# Patient Record
Sex: Male | Born: 1980
Health system: Southern US, Community
[De-identification: ages and names within clinical notes are randomized; demographics above are authoritative.]

## PROBLEM LIST (undated history)

## (undated) DIAGNOSIS — I4510 Unspecified right bundle-branch block: Secondary | ICD-10-CM

## (undated) DIAGNOSIS — E1110 Type 2 diabetes mellitus with ketoacidosis without coma: Secondary | ICD-10-CM

## (undated) DIAGNOSIS — I493 Ventricular premature depolarization: Secondary | ICD-10-CM

## (undated) HISTORY — PX: KNEE ARTHROSCOPY: SUR90

## (undated) HISTORY — DX: Ventricular premature depolarization: I49.3

## (undated) HISTORY — DX: Unspecified right bundle-branch block: I45.10

---

## 2008-03-28 ENCOUNTER — Emergency Department (HOSPITAL_COMMUNITY): Admission: EM | Admit: 2008-03-28 | Discharge: 2008-03-28 | Payer: Self-pay | Admitting: Family Medicine

## 2008-08-25 ENCOUNTER — Emergency Department (HOSPITAL_COMMUNITY): Admission: EM | Admit: 2008-08-25 | Discharge: 2008-08-25 | Payer: Self-pay | Admitting: Emergency Medicine

## 2010-12-24 ENCOUNTER — Emergency Department (HOSPITAL_COMMUNITY): Payer: Self-pay

## 2010-12-24 ENCOUNTER — Inpatient Hospital Stay (INDEPENDENT_AMBULATORY_CARE_PROVIDER_SITE_OTHER)
Admission: RE | Admit: 2010-12-24 | Discharge: 2010-12-24 | Disposition: A | Discharge status: Home / Self Care | Payer: Self-pay | Source: Ambulatory Visit | Attending: Emergency Medicine | Admitting: Emergency Medicine

## 2010-12-24 ENCOUNTER — Emergency Department (HOSPITAL_COMMUNITY)
Admission: EM | Admit: 2010-12-24 | Discharge: 2010-12-24 | Disposition: A | Discharge status: Home / Self Care | Payer: Self-pay | Attending: Emergency Medicine | Admitting: Emergency Medicine

## 2010-12-24 DIAGNOSIS — R1013 Epigastric pain: Secondary | ICD-10-CM | POA: Insufficient documentation

## 2010-12-24 DIAGNOSIS — R000 Tachycardia, unspecified: Secondary | ICD-10-CM | POA: Insufficient documentation

## 2010-12-24 DIAGNOSIS — R42 Dizziness and giddiness: Secondary | ICD-10-CM | POA: Insufficient documentation

## 2010-12-24 DIAGNOSIS — I4949 Other premature depolarization: Secondary | ICD-10-CM

## 2010-12-24 DIAGNOSIS — I498 Other specified cardiac arrhythmias: Secondary | ICD-10-CM

## 2010-12-24 DIAGNOSIS — R5381 Other malaise: Secondary | ICD-10-CM | POA: Insufficient documentation

## 2010-12-24 LAB — URINALYSIS, ROUTINE W REFLEX MICROSCOPIC
Ketones, ur: NEGATIVE mg/dL
Nitrite: NEGATIVE
Protein, ur: NEGATIVE mg/dL

## 2010-12-24 LAB — BASIC METABOLIC PANEL
BUN: 7 mg/dL (ref 6–23)
Calcium: 9.4 mg/dL (ref 8.4–10.5)
Creatinine, Ser: 1.13 mg/dL (ref 0.4–1.5)
GFR calc Af Amer: >60 mL/min (ref >60)
GFR calc non Af Amer: >60 mL/min (ref >60)

## 2010-12-24 LAB — CBC
HCT: 39.4 % (ref 39.0–52.0)
Hemoglobin: 13.4 g/dL (ref 13.0–17.0)
RBC: 4.92 MIL/uL (ref 4.22–5.81)

## 2010-12-24 LAB — DIFFERENTIAL
Basophils Absolute: 0 10*3/uL (ref 0.0–0.1)
Basophils Relative: 0 % (ref 0–1)
Lymphocytes Relative: 21 % (ref 12–46)
Monocytes Absolute: 1.2 10*3/uL — ABNORMAL HIGH (ref 0.1–1.0)
Neutro Abs: 6.4 10*3/uL (ref 1.7–7.7)
Neutrophils Relative %: 66 % (ref 43–77)

## 2011-01-06 ENCOUNTER — Encounter: Payer: Self-pay | Admitting: *Deleted

## 2011-01-06 ENCOUNTER — Encounter: Payer: Self-pay | Admitting: Cardiovascular Disease

## 2011-01-07 ENCOUNTER — Encounter: Payer: Self-pay | Admitting: Cardiovascular Disease

## 2011-01-07 ENCOUNTER — Ambulatory Visit (INDEPENDENT_AMBULATORY_CARE_PROVIDER_SITE_OTHER): Payer: Self-pay | Admitting: Cardiovascular Disease

## 2011-01-07 VITALS — BP 132/90 | HR 98 | Resp 14 | Ht 72.0 in | Wt 301.0 lb

## 2011-01-07 DIAGNOSIS — I493 Ventricular premature depolarization: Secondary | ICD-10-CM

## 2011-01-07 DIAGNOSIS — R42 Dizziness and giddiness: Secondary | ICD-10-CM

## 2011-01-07 DIAGNOSIS — E669 Obesity, unspecified: Secondary | ICD-10-CM

## 2011-01-07 DIAGNOSIS — I4949 Other premature depolarization: Secondary | ICD-10-CM

## 2011-01-07 NOTE — Patient Instructions (Signed)
Your physician recommends that you schedule a follow-up appointment in: AFTER TESTING  Your physician has requested that you have a stress echocardiogram. For further information please visit [email or web address] Please follow instruction sheet as given.

## 2011-01-07 NOTE — Assessment & Plan Note (Signed)
Discussed low carb [place] type diet.  Exercise program is stress echo normal.  Can use [employer] if needed for stress echo

## 2011-01-07 NOTE — Progress Notes (Signed)
30 yo referred from [facility] ER for dizzyness and PVC's.  Seen in ER about two weeks ago.  Had URI felt feverish for a few days prior.  Labs ok.  Had one 5 hr energy drink.  Did not note any palpitations.  Not postural and ECG otherwise ok without long QT.  No family history of sudden death, syncope.  ? Grandparent with CAD and CHF.  No previous cardiac disease.  No primary care doctor.  Obese and sedentary.  Discussed eating habits, portion size, not skipping meals and low carb diet.  Feels ok now with no palpitations, SSCP, dizzyness.  No regular meds, drugs.    ROS: Denies fever, malais, weight loss, blurry vision, decreased visual acuity, cough, sputum, SOB, hemoptysis, pleuritic pain, palpitaitons, heartburn, abdominal pain, melena, lower extremity edema, claudication, or rash.  All other systems reviewed and negative   General: Affect appropriate Healthy:  appears stated age HEENT: normal Neck supple with no adenopathy JVP normal no bruits no thyromegaly Lungs clear with no wheezing and good diaphragmatic motion Heart:  S1/S2 no murmur,rub, gallop or click PMI normal Abdomen: benighn, BS positve, no tenderness, no AAA no bruit.  No HSM or HJR Distal pulses intact with no bruits No edema Neuro non-focal Skin warm and dry No muscular weakness  Medications No current outpatient prescriptions on file.    Allergies Review of patient's allergies indicates no known allergies.  Family History: No family history on file.  Social History: History   Social History  . Marital Status: Married    Spouse Name: N/A    Number of Children: N/A  . Years of Education: N/A   Occupational History  . Not on file.   Social History Main Topics  . Smoking status: Never Smoker   . Smokeless tobacco: Not on file  . Alcohol Use: Not on file  . Drug Use: Not on file  . Sexually Active: Not on file   Other Topics Concern  . Not on file   Social History Narrative  . No narrative on  file    Electrocardiogram: NSR normal QT and intervals PVC;s  Assessment and Plan

## 2011-01-07 NOTE — Assessment & Plan Note (Signed)
Likely related to energy drink and URI.  Resolved

## 2011-01-07 NOTE — Assessment & Plan Note (Signed)
Need to R/O DCM and assess rhythm with exercise.  Stress echo

## 2011-01-13 ENCOUNTER — Ambulatory Visit (HOSPITAL_BASED_OUTPATIENT_CLINIC_OR_DEPARTMENT_OTHER): Payer: Self-pay | Admitting: Radiology

## 2011-01-13 ENCOUNTER — Ambulatory Visit (HOSPITAL_COMMUNITY): Payer: Self-pay | Attending: Cardiovascular Disease | Admitting: Radiology

## 2011-01-13 DIAGNOSIS — I4949 Other premature depolarization: Secondary | ICD-10-CM | POA: Insufficient documentation

## 2011-01-13 DIAGNOSIS — I493 Ventricular premature depolarization: Secondary | ICD-10-CM

## 2011-01-13 DIAGNOSIS — R0989 Other specified symptoms and signs involving the circulatory and respiratory systems: Secondary | ICD-10-CM

## 2011-01-13 MED ORDER — PERFLUTREN PROTEIN A MICROSPH IV SUSP
3.0000 mL | Freq: Once | INTRAVENOUS | Status: AC
  Administered 2011-01-13: 3 mL via INTRAVENOUS

## 2011-02-12 ENCOUNTER — Encounter: Payer: Self-pay | Admitting: Cardiovascular Disease

## 2011-02-12 ENCOUNTER — Ambulatory Visit (INDEPENDENT_AMBULATORY_CARE_PROVIDER_SITE_OTHER): Payer: Self-pay | Admitting: Cardiovascular Disease

## 2011-02-12 VITALS — BP 130/87 | HR 95 | Resp 14 | Ht 72.0 in | Wt 303.0 lb

## 2011-02-12 DIAGNOSIS — I4949 Other premature depolarization: Secondary | ICD-10-CM

## 2011-02-12 DIAGNOSIS — R42 Dizziness and giddiness: Secondary | ICD-10-CM

## 2011-02-12 DIAGNOSIS — E669 Obesity, unspecified: Secondary | ICD-10-CM

## 2011-02-12 DIAGNOSIS — I493 Ventricular premature depolarization: Secondary | ICD-10-CM

## 2011-02-12 NOTE — Assessment & Plan Note (Signed)
Reiterated exercise program and low carb diet

## 2011-02-12 NOTE — Patient Instructions (Signed)
Your physician wants you to follow-up in: 6 MONTHS  You will receive a reminder letter in the mail two months in advance. If you don't receive a letter, please call our office to schedule the follow-up appointment.   Your physician has recommended that you wear a holter monitor. Holter monitors are medical devices that record the heart's electrical activity. Doctors most often use these monitors to diagnose arrhythmias. Arrhythmias are problems with the speed or rhythm of the heartbeat. The monitor is a small, portable device. You can wear one while you do your normal daily activities. This is usually used to diagnose what is causing palpitations/syncope (passing out).24 HOUR

## 2011-02-12 NOTE — Progress Notes (Signed)
30 yo referred from [facility] ER for dizzyness and PVC's. Seen in ER about two weeks ago. Had URI felt feverish for a few days prior. Labs ok. Had one 5 hr energy drink. Did not note any palpitations. Not postural and ECG otherwise ok without long QT. No family history of sudden death, syncope. ? Grandparent with CAD and CHF. No previous cardiac disease. No primary care doctor. Obese and sedentary. Discussed eating habits, portion size, not skipping meals and low carb diet. Feels ok now with no palpitations, SSCP, dizzyness. No regular meds, drugs.   Since last month has not lost weight but trying to have atleast cheese in am.  No SSCP, or palpitations.  Stress echo was normal.  Still having PVC;s in office today  ROS: Denies fever, malais, weight loss, blurry vision, decreased visual acuity, cough, sputum, SOB, hemoptysis, pleuritic pain, palpitaitons, heartburn, abdominal pain, melena, lower extremity edema, claudication, or rash.  All other systems reviewed and negative  General: Affect appropriate Healthy:  appears stated age HEENT: normal Neck supple with no adenopathy JVP normal no bruits no thyromegaly Lungs clear with no wheezing and good diaphragmatic motion Heart:  S1/S2 no murmur,rub, gallop or click PMI normal Abdomen: benighn, BS positve, no tenderness, no AAA no bruit.  No HSM or HJR Distal pulses intact with no bruits No edema Neuro non-focal Skin warm and dry No muscular weakness   No current outpatient prescriptions on file.    Allergies  Review of patient's allergies indicates no known allergies.  Electrocardiogram:  Assessment and Plan

## 2011-02-12 NOTE — Assessment & Plan Note (Signed)
Resolved.  Likely related to energy drink and dehydration

## 2011-02-12 NOTE — Assessment & Plan Note (Signed)
Encouraging that stress echo was normal.  24 hr holter to quantify.

## 2011-02-19 ENCOUNTER — Encounter (INDEPENDENT_AMBULATORY_CARE_PROVIDER_SITE_OTHER): Payer: Self-pay

## 2011-02-19 DIAGNOSIS — I493 Ventricular premature depolarization: Secondary | ICD-10-CM

## 2011-02-19 DIAGNOSIS — I4949 Other premature depolarization: Secondary | ICD-10-CM

## 2011-03-06 ENCOUNTER — Telehealth: Payer: Self-pay | Admitting: *Deleted

## 2011-03-06 NOTE — Telephone Encounter (Signed)
LEFT MESSAGE WITH WIFE FOR PT TO CALL BACK  RE HOLTER RESULTS PER DR NISHAN NEEDS  EP REFERRAL  MONITOR SHOWS  SR PVC'S BIGEMINY  AT  8 % OF TIME .Zack Seal

## 2011-03-10 NOTE — Telephone Encounter (Signed)
LMTCB ONCE AGAIN./CY

## 2011-03-16 NOTE — Telephone Encounter (Signed)
ATTEMPTED TO REACH PT AGAIN UNABLE TO LEAVE MESSAGE MESSAGE MAIL BOX IS FULL ./CY

## 2011-03-18 NOTE — Telephone Encounter (Signed)
LM ONCE AGAIN  FOR PT TO CALL RE MONITOR RESULTS./CY

## 2011-03-20 ENCOUNTER — Telehealth: Payer: Self-pay | Admitting: Internal Medicine

## 2011-03-20 NOTE — Telephone Encounter (Signed)
Pt declined earlier appt, didn't want to come sooner, wanted to ask what the appt was about and why he needed to be seen by a specialist

## 2011-04-20 ENCOUNTER — Ambulatory Visit (INDEPENDENT_AMBULATORY_CARE_PROVIDER_SITE_OTHER): Payer: Self-pay | Admitting: Internal Medicine

## 2011-04-20 ENCOUNTER — Encounter: Payer: Self-pay | Admitting: Internal Medicine

## 2011-04-20 DIAGNOSIS — E669 Obesity, unspecified: Secondary | ICD-10-CM

## 2011-04-20 DIAGNOSIS — I4949 Other premature depolarization: Secondary | ICD-10-CM

## 2011-04-20 DIAGNOSIS — R42 Dizziness and giddiness: Secondary | ICD-10-CM

## 2011-04-20 DIAGNOSIS — I493 Ventricular premature depolarization: Secondary | ICD-10-CM

## 2011-04-20 NOTE — Assessment & Plan Note (Signed)
Weight loss advised 

## 2011-04-20 NOTE — Assessment & Plan Note (Signed)
The patient is a pleasant 30 yo AAM with no family history of arrhythmias or sudden death who presents today for EP consultation regarding very frequent PVCs.  At this time, he is asymptomatic with his PVCs.  He has had no documented VT and denies history of presyncope or syncope.  His recent stress echo was without evidence of ischemia.  He does have a baseline RBBB. 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  At this point, I think that we should exclude obvious right ventricular pathology.  I will ask Dr Eden Emms to review the recent echo to evaluate the RV.  If the RV is adequately visualized and normal, then no further testing is advised.  If his RV cannot be adequately visualized or is felt to be abnormal, then I would recommend cardiac MRI. I have offered beta blocker therapy for PVCs suppression at this time.  As the patient is asymptomatic, he declines beta blockers, stating that he would like to avoid medications.  If he develops symptoms with his PVCs, then we could consider beta blocker therapy vs calcium channel blockers.  I would be happy to see him again at that time.  In the interim, he will continue to follow with Dr Eden Emms.

## 2011-04-20 NOTE — Progress Notes (Signed)
Roy Jennings is a pleasant 30 y.o. AAM patient with a h/o recently discovered PVCs and RBBB who presents today for EP consultation.  He reports being in good health until 6/12 when he began having episodes of dizziness.  He reports having "headaches" and sweats.  He reports having associated dizziness upon standing, typically lasting several minutes.  He reports feeling "woozy" when walking.  He had symptoms of URI and felt that he may have had "flu". He presented to [facility] ER and was found to have PVCs.  He was felt to have dizziness due to "energy drinks".  He stopped drinking energy drinks and feels that this helped resolved his symptoms. Presently, he is doing well.  He feels at his baseline and remains very active.  He walks 2 miles every other day without difficulty. He denies any prior episodes of syncope or presyncope.  He has no family history of seizures, syncope, sudden death, or arrhythmias.  Today, he denies symptoms of palpitations, chest pain, shortness of breath, orthopnea, PND, lower extremity edema, dizziness, presyncope, syncope, or neurologic sequela. The patient is tolerating medications without difficulties and is otherwise without complaint today.   Past Medical History  Diagnosis Date  . Pain, dental   . PVC (premature ventricular contraction)     LBB inferior axis QRS  . Dizziness   . Right bundle branch block   . DJD (degenerative joint disease)    Past Surgical History  Procedure Date  . Knee arthroscopy     bilateral    Medications:  none  No Known Allergies  History   Social History  . Marital Status: Married    Spouse Name: N/A    Number of Children: N/A  . Years of Education: N/A   Occupational History  . Not on file.   Social History Main Topics  . Smoking status: Never Smoker   . Smokeless tobacco: Not on file  . Alcohol Use: No  . Drug Use: No  . Sexually Active: Not on file   Other Topics Concern  . Not on file   Social History  Narrative   Pt lives in [place] with wife.  1 son is healthy age 13.    Family History  Problem Relation Age of Onset  . Diabetes    no FH of arrhythmias or sudden death  ROS- All systems are reviewed and negative except as per the HPI above  Physical Exam: Filed Vitals:   04/20/11 1205  BP: 121/76  Pulse: 76  Height: 5\' 11"  (1.803 m)  Weight: 297 lb 12.8 oz (135.081 kg)    GEN- The patient is obese but well appearing, alert and oriented x 3 today.   Head- normocephalic, atraumatic Eyes-  Sclera clear, conjunctiva pink Ears- hearing intact Oropharynx- clear Neck- supple, no JVP Lymph- no cervical lymphadenopathy Lungs- Clear to ausculation bilaterally, normal work of breathing Heart- Regular rate and rhythm with occasional ectopy, no murmurs, rubs or gallops, PMI not laterally displaced GI- soft, NT, ND, + BS Extremities- no clubbing, cyanosis, or edema MS- no significant deformity or atrophy Skin- no rash or lesion Psych- euthymic mood, full affect Neuro- strength and sensation are intact  EKG today reveals sinus rhythm with RBBB, PR 160, QRS 148, Qtc 459, PVCs are of a LBB inferior axis morphology and are not closely coupled to preceeding T waves Stress echo also reviewed  Assessment and Plan:

## 2011-04-20 NOTE — Assessment & Plan Note (Signed)
resolved 

## 2011-04-28 ENCOUNTER — Telehealth: Payer: Self-pay | Admitting: *Deleted

## 2011-04-28 DIAGNOSIS — I493 Ventricular premature depolarization: Secondary | ICD-10-CM

## 2011-04-28 NOTE — Telephone Encounter (Signed)
Message copied by Freddi Starr on Tue Apr 28, 2011  3:22 PM ------      Message from: Wendall Stade      Created: Sun Apr 26, 2011 12:23 AM      Regarding: FW: patient       Schedule MRI to R/O RV dysplasia      ----- Message -----         From: Gardiner Rhyme, MD         Sent: 04/20/2011   8:53 PM           To: Wendall Stade, MD      Subject: patient                                                  Please look at Mr Lawyers prior echo to evaluate his RV.  If adequately visualized and normal, then I recommend no further testing.  If you don't feel that his RV is adequately seen or is abnormal, then I would recommend cardiac MRI.

## 2011-04-28 NOTE — Telephone Encounter (Signed)
Spoke with pt, he is aware he will need a cardiac mri and someone will call to schedule Deliah Goody

## 2011-04-29 ENCOUNTER — Telehealth: Payer: Self-pay | Admitting: Cardiovascular Disease

## 2011-04-29 NOTE — Telephone Encounter (Signed)
Spoke with pt wife, she questions how important and how soon he would need the MRI testing. They have both lost their jobs and want to put off the testing if not urgent at this time. They also want to know what the next step would be if something was abnormal with the RV. Will forward for dr allred review Deliah Goody

## 2011-04-29 NOTE — Telephone Encounter (Signed)
When I saw him, he reported that his symptoms were resolved.  He also denies h/o syncope or family history of arrhythmia.  These would all argue that we could avoid the MRI possibly. If he wishes to hold off on the MRI, I think that this is reasonable.  It may give [place] additional information about his heart however. If he does not have insurance, I can understand his concerns. He can contact us in the future when he wishes to proceed.  A cheaper alternative could be 2D echo to get a better look at the RV, though I will defer this decision to Dr Eden Emms.

## 2011-04-29 NOTE — Telephone Encounter (Signed)
Pt's wife is calling about mri please call

## 2011-04-30 NOTE — Telephone Encounter (Signed)
Per dr Eden Emms and dr allred pt does not need any testing at this time . Spoke with pt wife, she is aware to give [place] a call if the pt has any problems otherwise we will decide about testing when pt returns for follow up Roy Jennings

## 2011-05-13 ENCOUNTER — Other Ambulatory Visit (HOSPITAL_COMMUNITY): Payer: Self-pay

## 2011-12-30 ENCOUNTER — Inpatient Hospital Stay (HOSPITAL_COMMUNITY)
Admission: EM | Admit: 2011-12-30 | Discharge: 2011-12-31 | DRG: 638 | Disposition: A | Discharge status: Home / Self Care | Payer: MEDICAID | Attending: Internal Medicine | Admitting: Internal Medicine

## 2011-12-30 ENCOUNTER — Emergency Department (INDEPENDENT_AMBULATORY_CARE_PROVIDER_SITE_OTHER)
Admission: EM | Admit: 2011-12-30 | Discharge: 2011-12-30 | Disposition: A | Discharge status: Nursing Home (Includes ICF) | Payer: Self-pay | Source: Home / Self Care | Attending: Family Medicine | Admitting: Family Medicine

## 2011-12-30 ENCOUNTER — Encounter (HOSPITAL_COMMUNITY): Payer: Self-pay | Admitting: *Deleted

## 2011-12-30 ENCOUNTER — Other Ambulatory Visit: Payer: Self-pay

## 2011-12-30 ENCOUNTER — Encounter (HOSPITAL_COMMUNITY): Payer: Self-pay | Admitting: Emergency Medicine

## 2011-12-30 DIAGNOSIS — E1010 Type 1 diabetes mellitus with ketoacidosis without coma: Principal | ICD-10-CM | POA: Diagnosis present

## 2011-12-30 DIAGNOSIS — R0902 Hypoxemia: Secondary | ICD-10-CM | POA: Diagnosis present

## 2011-12-30 DIAGNOSIS — I498 Other specified cardiac arrhythmias: Secondary | ICD-10-CM

## 2011-12-30 DIAGNOSIS — R739 Hyperglycemia, unspecified: Secondary | ICD-10-CM | POA: Diagnosis present

## 2011-12-30 DIAGNOSIS — E119 Type 2 diabetes mellitus without complications: Secondary | ICD-10-CM

## 2011-12-30 DIAGNOSIS — R7309 Other abnormal glucose: Secondary | ICD-10-CM

## 2011-12-30 DIAGNOSIS — B370 Candidal stomatitis: Secondary | ICD-10-CM | POA: Diagnosis present

## 2011-12-30 DIAGNOSIS — E1110 Type 2 diabetes mellitus with ketoacidosis without coma: Secondary | ICD-10-CM

## 2011-12-30 DIAGNOSIS — I4510 Unspecified right bundle-branch block: Secondary | ICD-10-CM | POA: Diagnosis present

## 2011-12-30 DIAGNOSIS — R000 Tachycardia, unspecified: Secondary | ICD-10-CM

## 2011-12-30 DIAGNOSIS — E860 Dehydration: Secondary | ICD-10-CM

## 2011-12-30 DIAGNOSIS — D72829 Elevated white blood cell count, unspecified: Secondary | ICD-10-CM | POA: Diagnosis present

## 2011-12-30 HISTORY — DX: Type 2 diabetes mellitus with ketoacidosis without coma: E11.10

## 2011-12-30 LAB — BASIC METABOLIC PANEL
Calcium: 9.3 mg/dL (ref 8.4–10.5)
Calcium: 9.1 mg/dL (ref 8.4–10.5)
Creatinine, Ser: 1.06 mg/dL (ref 0.50–1.35)
GFR calc Af Amer: >90 mL/min (ref >90)
GFR calc Af Amer: >90 mL/min (ref >90)
GFR calc non Af Amer: >90 mL/min (ref >90)
GFR calc non Af Amer: >90 mL/min (ref >90)
Potassium: 3.9 mEq/L (ref 3.5–5.1)
Sodium: 138 mEq/L (ref 135–145)
Sodium: 136 mEq/L (ref 135–145)

## 2011-12-30 LAB — CBC
Hemoglobin: 15.2 g/dL (ref 13.0–17.0)
Hemoglobin: 14.4 g/dL (ref 13.0–17.0)
MCH: 27 pg (ref 26.0–34.0)
MCHC: 33.9 g/dL (ref 30.0–36.0)
MCHC: 34.8 g/dL (ref 30.0–36.0)
MCV: 79.7 fL (ref 78.0–100.0)
RBC: 5.62 MIL/uL (ref 4.22–5.81)
RBC: 5.22 MIL/uL (ref 4.22–5.81)
WBC: 13.1 10*3/uL — ABNORMAL HIGH (ref 4.0–10.5)

## 2011-12-30 LAB — URINALYSIS, ROUTINE W REFLEX MICROSCOPIC
Ketones, ur: 40 mg/dL — AB
Leukocytes, UA: NEGATIVE
Nitrite: NEGATIVE
Specific Gravity, Urine: 1.044 — ABNORMAL HIGH (ref 1.005–1.030)
pH: 5 (ref 5.0–8.0)

## 2011-12-30 LAB — RAPID URINE DRUG SCREEN, HOSP PERFORMED
Barbiturates: NOT DETECTED
Tetrahydrocannabinol: NOT DETECTED

## 2011-12-30 LAB — COMPREHENSIVE METABOLIC PANEL
BUN: 15 mg/dL (ref 6–23)
Calcium: 10.2 mg/dL (ref 8.4–10.5)
Creatinine, Ser: 1.2 mg/dL (ref 0.50–1.35)
GFR calc Af Amer: >90 mL/min (ref >90)
Glucose, Bld: 448 mg/dL — ABNORMAL HIGH (ref 70–99)
Total Protein: 8.1 g/dL (ref 6.0–8.3)

## 2011-12-30 LAB — POCT I-STAT, CHEM 8
Calcium, Ion: 1.27 mmol/L (ref 1.12–1.32)
Chloride: 99 mEq/L (ref 96–112)
HCT: 55 % — ABNORMAL HIGH (ref 39.0–52.0)
Hemoglobin: 18.7 g/dL — ABNORMAL HIGH (ref 13.0–17.0)
Potassium: 4.5 mEq/L (ref 3.5–5.1)

## 2011-12-30 LAB — CARDIAC PANEL(CRET KIN+CKTOT+MB+TROPI)
CK, MB: 2.9 ng/mL (ref 0.3–4.0)
Total CK: 307 U/L — ABNORMAL HIGH (ref 7–232)
Troponin I: <0.3 ng/mL (ref <0.30)

## 2011-12-30 LAB — POCT I-STAT 3, ART BLOOD GAS (G3+)
Bicarbonate: 24.2 mEq/L — ABNORMAL HIGH (ref 20.0–24.0)
Patient temperature: 98.6
TCO2: 25 mmol/L (ref 0–100)
pH, Arterial: 7.377 (ref 7.350–7.450)

## 2011-12-30 LAB — GLUCOSE, CAPILLARY: Glucose-Capillary: 332 mg/dL — ABNORMAL HIGH (ref 70–99)

## 2011-12-30 LAB — URINE MICROSCOPIC-ADD ON

## 2011-12-30 LAB — DIFFERENTIAL
Eosinophils Absolute: 0.1 10*3/uL (ref 0.0–0.7)
Eosinophils Relative: 1 % (ref 0–5)
Lymphs Abs: 2.4 10*3/uL (ref 0.7–4.0)
Monocytes Relative: 6 % (ref 3–12)

## 2011-12-30 MED ORDER — SODIUM CHLORIDE 0.9 % IV SOLN: INTRAVENOUS | Status: AC

## 2011-12-30 MED ORDER — ENOXAPARIN SODIUM 40 MG/0.4ML ~~LOC~~ SOLN
40.0000 mg | SUBCUTANEOUS | Status: DC
  Administered 2011-12-30: 40 mg via SUBCUTANEOUS
  Filled 2011-12-30 (×2): qty 0.4

## 2011-12-30 MED ORDER — SODIUM CHLORIDE 0.9 % IV SOLN: INTRAVENOUS | Status: DC

## 2011-12-30 MED ORDER — DEXTROSE-NACL 5-0.45 % IV SOLN
INTRAVENOUS | Status: DC
  Administered 2011-12-31: 08:00:00 via INTRAVENOUS

## 2011-12-30 MED ORDER — INSULIN REGULAR HUMAN 100 UNIT/ML IJ SOLN
INTRAMUSCULAR | Status: DC
  Administered 2011-12-31: 05:00:00 via INTRAVENOUS
  Filled 2011-12-30: qty 1

## 2011-12-30 MED ORDER — SODIUM CHLORIDE 0.9 % IV SOLN
1000.0000 mL | INTRAVENOUS | Status: DC
  Administered 2011-12-30 – 2011-12-31 (×2): 1000 mL via INTRAVENOUS

## 2011-12-30 MED ORDER — SODIUM CHLORIDE 0.9 % IV SOLN
INTRAVENOUS | Status: DC
  Administered 2011-12-30: 2.7 [IU]/h via INTRAVENOUS
  Filled 2011-12-30: qty 1

## 2011-12-30 MED ORDER — POTASSIUM CHLORIDE 10 MEQ/100ML IV SOLN
10.0000 meq | INTRAVENOUS | Status: AC
  Administered 2011-12-30 (×2): 10 meq via INTRAVENOUS
  Filled 2011-12-30 (×3): qty 100

## 2011-12-30 MED ORDER — SODIUM CHLORIDE 0.9 % IJ SOLN
INTRAMUSCULAR | Status: AC
  Filled 2011-12-30: qty 3

## 2011-12-30 MED ORDER — NYSTATIN 100000 UNIT/ML MT SUSP
5.0000 mL | Freq: Four times a day (QID) | OROMUCOSAL | Status: DC
  Administered 2011-12-30 – 2011-12-31 (×3): 500000 [IU] via ORAL
  Filled 2011-12-30 (×5): qty 5

## 2011-12-30 MED ORDER — SODIUM CHLORIDE 0.9 % IV BOLUS (SEPSIS): 500.0000 mL | Freq: Once | INTRAVENOUS | Status: DC

## 2011-12-30 MED ORDER — ASPIRIN EC 81 MG PO TBEC
81.0000 mg | DELAYED_RELEASE_TABLET | Freq: Every day | ORAL | Status: DC
  Filled 2011-12-30: qty 1

## 2011-12-30 MED ORDER — DEXTROSE 50 % IV SOLN: 25.0000 mL | INTRAVENOUS | Status: DC | PRN

## 2011-12-30 MED ORDER — SODIUM CHLORIDE 0.9 % IV SOLN
1000.0000 mL | Freq: Once | INTRAVENOUS | Status: AC
  Administered 2011-12-30: 1000 mL via INTRAVENOUS

## 2011-12-30 MED ORDER — SODIUM CHLORIDE 0.9 % IV SOLN
INTRAVENOUS | Status: AC
  Administered 2011-12-30: 19:00:00 via INTRAVENOUS

## 2011-12-30 NOTE — H&P (Signed)
Date: 12/30/2011               Patient Name:  Roy Jennings MRN: [id number]  DOB: 08-30-1980 Age / Sex: 31 y.o., male   PCP: None              Medical Service: Internal Medicine Teaching Service              Attending Physician: Dr. Josem Kaufmann    First Contact: Dr. Candy Sledge Pager: [phone]  Second Contact: Dr. Saralyn Pilar Pager: [phone]            After Hours (After 5p/  First Contact Pager: [phone]  weekends / holidays): Second Contact Pager: [phone]     Chief Complaint: Drinking a lot and peeing lot  History of Present Illness: Patient is a 31 y.o. male with a PMHx of newly diagnosed right bundle branch block, who presents to [facility] for evaluation of a 3-4 history of increased frequency urination, and increased thirst. On the day of admission, had increased sensation of positional lightheadedness and a queasy feeling in his stomach. He states he has had a lot of cramping of his legs. Has had reduced appetite over this time frame as well. Denies chest pain and shortness of breath.  Had some sore throat, and has essentially only been eating liquid foods and Jell-O. He tried taking some pepto, which was minimally helpful. Drinking milk also makes his throat feel better. He anything acidic makes the pain worse. He has not noticed any white patches in his mouth. Has had reduced appetite over this time frame as well. Denies chest pain.   Review of Systems: Constitutional:  denies fever, chills, diaphoresis Admits to appetite change and fatigue.  HEENT: denies photophobia, eye pain, redness, hearing loss, ear pain, congestion, sore throat, rhinorrhea, sneezing, neck pain, neck stiffness and tinnitus.  Respiratory: denies SOB, DOE, cough, chest tightness, and wheezing.  Cardiovascular: denies chest pain, palpitations and leg swelling.  Gastrointestinal: denies nausea, vomiting, abdominal pain, diarrhea, constipation, blood in stool.  Genitourinary: denies dysuria, urgency, frequency,  hematuria, flank pain and difficulty urinating.  Musculoskeletal: admits to lower extremity cramping. Denies myalgias, back pain, joint swelling, arthralgias and gait problem.   Skin: denies pallor, rash and wound.  Neurological: denies dizziness, seizures, syncope, weakness, numbness and headaches.  Admits to light-headedness  Hematological: denies adenopathy, easy bruising, personal or family bleeding history.  Psychiatric/ Behavioral: denies suicidal ideation, mood changes, confusion, nervousness, sleep disturbance and agitation.    Current Outpatient Medications:  (Not in a hospital admission) Currently not taking any outpatient medications. He is thinking of starting aspirin. Allergies: No Known Allergies   Past Medical History: Past Medical History  Diagnosis Date  . 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 Dizziness   . Right bundle branch block     Past Surgical History: Past Surgical History  Procedure Date  . Knee arthroscopy     bilateral    Family History: Family History  Problem Relation Age of Onset  . Diabetes Father   . Diabetes Maternal Grandmother     Social History: History   Social History  . Marital Status: Married    Spouse Name: N/A    Number of Children: 1  . Years of Education: BS   Occupational History  .      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 Smoking status: Never Smoker   .  Smokeless tobacco: Never Used  . Alcohol Use: No  . Drug Use: No  . Sexually Active: Not on file   Other Topics Concern  . Not on file   Social History Narrative   Pt lives in [place] with wife.  1 son is healthy age 47.    Vital Signs: Blood pressure 132/75, pulse 119, temperature 97.8 F (36.6 C), temperature source Oral, resp. rate 16, SpO2 96.00%.  Physical Exam: General: Vital signs reviewed and noted. Well-developed, well-nourished, in no acute  distress; alert, appropriate and cooperative throughout examination.  Head: Normocephalic, atraumatic.  Eyes: PERRL, EOMI, No signs of anemia or jaundince.  Nose: Mucous membranes moist, not inflammed, nonerythematous.  Throat: Oropharynx nonerythematous, no exudate appreciated.   Neck: No deformities, masses, or tenderness noted.Supple, No carotid Bruits, no JVD.  Lungs:  Normal respiratory effort. Clear to auscultation BL without crackles or wheezes.  Heart: RRR. S1 and S2 normal without gallop, murmur, or rubs.  Abdomen:  BS normoactive. Soft, Nondistended, non-tender.  No masses or organomegaly.  Extremities: No pretibial edema.  Neurologic: A&O X3, CN II - XII are grossly intact. Motor strength is 5/5 in the all 4 extremities, Sensations intact to light touch, Cerebellar signs negative.  Skin: No visible rashes, scars.   Lab results: Basic Metabolic Panel:  Basename 12/30/11 1542 12/30/11 1336  NA 135 131*  K 4.8 4.5  CL 91* 99  CO2 26 --  GLUCOSE 448* >700*  BUN 15 18  CREATININE 1.20 1.30  CALCIUM 10.2 --  MG -- --  PHOS -- --   Liver Function Tests:  Basename 12/30/11 1542  AST 24  ALT 55*  ALKPHOS 123*  BILITOT 0.4  PROT 8.1  ALBUMIN 3.9   ABG    Component Value Date/Time   PHART 7.377 12/30/2011 1554   PCO2ART 41.3 12/30/2011 1554   PO2ART 75.0* 12/30/2011 1554   HCO3 24.2* 12/30/2011 1554   TCO2 25 12/30/2011 1554   ACIDBASEDEF 1.0 12/30/2011 1554   O2SAT 95.0 12/30/2011 1554    CBC:  Basename 12/30/11 1542 12/30/11 1336  WBC 13.8* --  NEUTROABS 10.4* --  HGB 15.2 18.7*  HCT 44.8 55.0*  MCV 79.7 --  PLT 349 --    Basename 12/30/11 1747 12/30/11 1434  GLUCAP 332* 566*   Urine Drug Screen: Drugs of Abuse     Component Value Date/Time   LABOPIA NONE DETECTED 12/30/2011 1519   COCAINSCRNUR NONE DETECTED 12/30/2011 1519   LABBENZ NONE DETECTED 12/30/2011 1519   AMPHETMU NONE DETECTED 12/30/2011 1519   THCU NONE DETECTED 12/30/2011 1519   LABBARB NONE  DETECTED 12/30/2011 1519    Urinalysis:  Ref. Range 12/30/2011 15:19  Color, Urine Latest Range: YELLOW  YELLOW  APPearance Latest Range: CLEAR  CLEAR  Specific Gravity, Urine Latest Range: 1.005-1.030  1.044 (H)  pH Latest Range: 5.0-8.0  5.0  Glucose, UA Latest Range: NEGATIVE mg/dL >4098 (A)  Bilirubin Urine Latest Range: NEGATIVE  NEGATIVE  Ketones, ur Latest Range: NEGATIVE mg/dL 40 (A)  Protein Latest Range: NEGATIVE mg/dL 119 (A)  Urobilinogen, UA Latest Range: 0.0-1.0 mg/dL 0.2  Nitrite Latest Range: NEGATIVE  NEGATIVE  Leukocytes, UA Latest Range: NEGATIVE  NEGATIVE  Hgb urine dipstick Latest Range: NEGATIVE  TRACE (A)  WBC, UA Latest Range: <3 WBC/hpf 0-2  RBC / HPF Latest Range: <3 RBC/hpf 0-2  Squamous Epithelial / LPF Latest Range: RARE  RARE  Casts Latest Range: NEGATIVE  HYALINE CASTS (A)    Other  results:  EKG (12/30/2011) - sinus tachycardia with right bundle branch block. T wave inversions in lead 3 stable from prior study performed 12/24/2011.  2-D echo Study Date: 01/13/2011 History: PMH: Premature ventricular contractions. Risk factors: Morbidly obese.  Study Conclusions Impressions: Resting images showed normal EF. Baseline ECG with RBBB and frequent PVC;s. With stress ectopy actually diminished. There was no ischemia with stress. PVC;s returned in recovery. Stress images with optison for better clarity of endocardium. Hyperdynamic LV function EF 75% with no new RWMA;s. Normal stress echo Impressions:  - Resting images showed normal EF. Baseline ECG with RBBB and frequent PVC;s. With stress ectopy actually diminished. There was no ischemia with stress. PVC;s returned in recovery. Stress images with optison for better clarity of endocardium. Hyperdynamic LV function EF 75% with no new RWMA;s.    Assessment & Plan:  Pt is a 31 y.o. yo male with a PMHx of right bundle bite branch block who was admitted on 12/30/2011 with symptoms of polyuria, polydipsia and  dysphasia, which was determined to be secondary to diabetes mellitus and thrush Interventions at this time will be focused on treating the patient's current diabetic ketoacidosis and thrush.  1) Diabetic Ketoacidosis - The patient does not have known history of diabetes mellitus, therefore, this acute presentation likely does represent new diabetes diagnosis. Patient with typical expected symptoms including mild in the entire abdomen abdominal pain, sore throat and polyuria and polydipsia. Urinalysis show > 1000 glucose, (+) ketones. This acute occurrence does not seem to have a precipitating factor, though in a patient with right bundle branch block does need to be ruled out for cardiac ischemia. Patient otherwise denies drug abuse such as cocaine or excessive alcohol abuse.  - DKA protocol  - Aggressive IVF liter bolus followed by 200 cc normal saline per hour - Diabetes education, as patient is being newly diagnosed.  - Check HgA1c  - Check TSH  - CM to assess for financial programs to aid in assistance with insulin.   2) Metabolic derangements - ABG was performed today, results showing isolated hypoxia with PO2 of 75 with AG of 18.  No fevers, change in mental status to suggest methanol or ethylene glycol toxicity. No recent new medications. No renal failure and mental status changes to account for uremia. - Will volume replete and treat DKA  3) Hypoxemia - patient has a PO2 of 75 with an AA gradient of 22, while the expected AA gradient for his age is 27. Unclear if possibly venous sample was obtained, but other values are normal for ABG. Otherwise, if correct ABG sample, the differential diagnosis includes shunt (PFO, ASD, PE, pulmonary AVMs), most likely cause would be possible shunt given cardiac disease and questionable right-sided function.  DKA does place the patient more risk for DVT and revised Geneva score is 5 (for heart rate of 120), however he is very volume depleted which might explain  tachycardia.  Prior stress echo did not mention septal defect, but a bubble study would be necessary to evaluate this. Alveolar Hypoventilation (ex: interstitial lung/environmental lung disease) and V/Q mismatch V/Q Mismatch (pneumonia, pulmonary edema, ARDS) are less likely as the patient denies respiratory symptoms, cough, or fever and chest x ray 1 year ago was normal (useful for chronic etiologies). We'll obtain chest x-ray and repeat ABG to ensure our baseline is correct. Then if hypoxemia persists we will obtain D-Dimer and potentially V/Q scan. If these are negative shunt would be a more likely cause. -- 2  view chest x-ray -- D-dimer -- Possible VQ scan versus CTA  4) Right bundle branch block - appears stable from September 2012. Patient received normal stress echo in June of 2012 for PVCs per the clinic notes from Dr. Eden Emms. A cardiac MRI was suggested, in consultation with his cardiologist this test was deferred. Will continue to monitor, but suspect this is related to problem #3.  5) Thush - likely secondary to uncontrolled diabetes. will treat with nystatin 500,000 units 4 times a day for 48 hours after his symptoms resolve  6) Leukocytosis - likely secondary to stress hyperglycemia in setting of acute DKA. UA negative for evidence of UTI.  - Will monitor, and continue to monitor for signs/ symptoms of infection.  - Check CXR.  7) morbid obesity - counsel patient regarding diet modification.   DVT PPX - low molecular weight heparin    Latania Bascomb  12/30/2011, 6:41 PM

## 2011-12-30 NOTE — ED Notes (Addendum)
Per [facility]- pt is from urgent care. Pt prsented with dizziness, increased urination.BP 109/72. HR 140. CBG greater than 700. Pt received bolus at urgent care.

## 2011-12-30 NOTE — ED Provider Notes (Signed)
History     CSN: [id number]  Arrival date & time 12/30/11  1432   First MD Initiated Contact with Patient 12/30/11 1456      Chief Complaint  Patient presents with  . Hyperglycemia    (Consider location/radiation/quality/duration/timing/severity/associated sxs/prior treatment) HPI Comments: Patient is a 31 year old man who says for the past few days he's been drinking a lot of water. Today he felt a burning in his throat like acid reflux. He was very dizzy. He therefore went to the urgent care Center, and was found to have a blood sugar of over 300. They started IV fluids and sent her to [facility] for evaluation. There is a family history of diabetes, but he has never been diagnosed as having diabetes. He has been evaluated for arrhythmia by [facility] in the past, but is on no antiarrhythmic medications.  Patient is a 31 y.o. male presenting with diabetes problem. The history is provided by the patient and medical records. No language interpreter was used.  Diabetes He presents for his initial diabetic visit. He has type 2 diabetes mellitus. No MedicAlert identification noted. Onset time: First visit for a hyperglycemic episode. Associated symptoms include polydipsia, polyuria and weakness. Symptoms are worsening. Symptoms have been present for 4 days. Risk factors for coronary artery disease include sedentary lifestyle and obesity.    Past Medical History  Diagnosis Date  . Pain, dental   . PVC (premature ventricular contraction)     LBB inferior axis QRS  . Dizziness   . Right bundle branch block   . DJD (degenerative joint disease)     Past Surgical History  Procedure Date  . Knee arthroscopy     bilateral    Family History  Problem Relation Age of Onset  . Diabetes      History  Substance Use Topics  . Smoking status: Never Smoker   . Smokeless tobacco: Not on file  . Alcohol Use: No      Review of Systems  Constitutional:       Thirst and  weakness.  HENT: Negative.   Eyes: Negative.   Respiratory: Negative.   Cardiovascular: Negative.   Gastrointestinal: Negative.   Genitourinary: Positive for polyuria.  Musculoskeletal: Negative.   Skin: Negative.   Neurological: Positive for weakness.  Hematological: Positive for polydipsia.  Psychiatric/Behavioral: Negative.     Allergies  Review of patient's allergies indicates no known allergies.  Home Medications   Current Outpatient Rx  Name Route Sig Dispense Refill  . ASPIRIN EC 81 MG PO TBEC Oral Take 81 mg by mouth daily.    Marland Kitchen BISMUTH SUBSALICYLATE 262 MG/15ML PO SUSP Oral Take 15 mLs by mouth every 6 (six) hours as needed. For acid reflux.      BP 138/85  Pulse 119  Temp(Src) 97.8 F (36.6 C) (Oral)  Resp 20  SpO2 97%  Physical Exam  Nursing note and vitals reviewed. Constitutional: He is oriented to person, place, and time.       Obese young man with a resting tachycardia of 119.  HENT:  Head: Normocephalic and atraumatic.  Right Ear: External ear normal.  Left Ear: External ear normal.  Mouth/Throat: Oropharyngeal exudate: oropharynx is dry.  Eyes: Conjunctivae and EOM are normal. Pupils are equal, round, and reactive to light.  Neck: Normal range of motion. Neck supple.  Cardiovascular: Regular rhythm and normal heart sounds.        Resting tachycardia of 119.  Pulmonary/Chest: Effort normal and  breath sounds normal.  Abdominal: Soft. Bowel sounds are normal.  Musculoskeletal: Normal range of motion.  Neurological: He is alert and oriented to person, place, and time.       No sensory or motor deficit.  Skin: Skin is warm and dry.  Psychiatric: He has a normal mood and affect. His behavior is normal.    ED Course  Procedures (including critical care time)  Labs Reviewed  GLUCOSE, CAPILLARY - Abnormal; Notable for the following:    Glucose-Capillary 566 (*)    All other components within normal limits  CBC  DIFFERENTIAL  COMPREHENSIVE  METABOLIC PANEL  URINALYSIS, ROUTINE W REFLEX MICROSCOPIC  HEMOGLOBIN A1C   3:10 PM Patient was seen and had physical exam. Laboratory tests from the urgent care Center were reviewed, showing a blood sugar of over 700. He's already received a liter of IV fluids. Additional IV fluids, glucose stabilizer, and laboratory testing was ordered.   This EKG done at 1304 HOURS  Date: 12/30/2011  Rate:127  Rhythm: sinus tachycardia  QRS Axis: left  Intervals: normal  ST/T Wave abnormalities: normal  Conduction Disutrbances:right bundle branch block  Narrative Interpretation: Abnormal EKG  Old EKG Reviewed: changes noted--Had multiple PVC's and RBBB on tracing of 12/24/2010.  3:23 PM  Date: 12/30/2011  Rate:120  Rhythm: sinus tachycardia  QRS Axis: left  Intervals: normal  ST/T Wave abnormalities: normal  Conduction Disutrbances:right bundle branch block  Narrative Interpretation: Abnormal EKG  Old EKG Reviewed: unchanged  .5:47 PM Results for orders placed during the hospital encounter of 12/30/11  GLUCOSE, CAPILLARY      Component Value Range   Glucose-Capillary 566 (*) 70 - 99 (mg/dL)  CBC      Component Value Range   WBC 13.8 (*) 4.0 - 10.5 (K/uL)   RBC 5.62  4.22 - 5.81 (MIL/uL)   Hemoglobin 15.2  13.0 - 17.0 (g/dL)   HCT 41.3  24.4 - 01.0 (%)   MCV 79.7  78.0 - 100.0 (fL)   MCH 27.0  26.0 - 34.0 (pg)   MCHC 33.9  30.0 - 36.0 (g/dL)   RDW 27.2  53.6 - 64.4 (%)   Platelets 349  150 - 400 (K/uL)  DIFFERENTIAL      Component Value Range   Neutrophils Relative 75  43 - 77 (%)   Neutro Abs 10.4 (*) 1.7 - 7.7 (K/uL)   Lymphocytes Relative 18  12 - 46 (%)   Lymphs Abs 2.4  0.7 - 4.0 (K/uL)   Monocytes Relative 6  3 - 12 (%)   Monocytes Absolute 0.9  0.1 - 1.0 (K/uL)   Eosinophils Relative 1  0 - 5 (%)   Eosinophils Absolute 0.1  0.0 - 0.7 (K/uL)   Basophils Relative 0  0 - 1 (%)   Basophils Absolute 0.0  0.0 - 0.1 (K/uL)  COMPREHENSIVE METABOLIC PANEL      Component Value  Range   Sodium 135  135 - 145 (mEq/L)   Potassium 4.8  3.5 - 5.1 (mEq/L)   Chloride 91 (*) 96 - 112 (mEq/L)   CO2 26  19 - 32 (mEq/L)   Glucose, Bld 448 (*) 70 - 99 (mg/dL)   BUN 15  6 - 23 (mg/dL)   Creatinine, Ser 0.34  0.50 - 1.35 (mg/dL)   Calcium 74.2  8.4 - 10.5 (mg/dL)   Total Protein 8.1  6.0 - 8.3 (g/dL)   Albumin 3.9  3.5 - 5.2 (g/dL)   AST 24  0 -  37 (U/L)   ALT 55 (*) 0 - 53 (U/L)   Alkaline Phosphatase 123 (*) 39 - 117 (U/L)   Total Bilirubin 0.4  0.3 - 1.2 (mg/dL)   GFR calc non Af Amer 79 (*) >90 (mL/min)   GFR calc Af Amer >90  >90 (mL/min)  URINALYSIS, ROUTINE W REFLEX MICROSCOPIC      Component Value Range   Color, Urine YELLOW  YELLOW    APPearance CLEAR  CLEAR    Specific Gravity, Urine 1.044 (*) 1.005 - 1.030    pH 5.0  5.0 - 8.0    Glucose, UA >1000 (*) NEGATIVE (mg/dL)   Hgb urine dipstick TRACE (*) NEGATIVE    Bilirubin Urine NEGATIVE  NEGATIVE    Ketones, ur 40 (*) NEGATIVE (mg/dL)   Protein, ur 161 (*) NEGATIVE (mg/dL)   Urobilinogen, UA 0.2  0.0 - 1.0 (mg/dL)   Nitrite NEGATIVE  NEGATIVE    Leukocytes, UA NEGATIVE  NEGATIVE   URINE MICROSCOPIC-ADD ON      Component Value Range   Squamous Epithelial / LPF RARE  RARE    WBC, UA 0-2  <3 (WBC/hpf)   RBC / HPF 0-2  <3 (RBC/hpf)   Casts HYALINE CASTS (*) NEGATIVE   POCT I-STAT 3, BLOOD GAS (G3+)      Component Value Range   pH, Arterial 7.377  7.350 - 7.450    pCO2 arterial 41.3  35.0 - 45.0 (mmHg)   pO2, Arterial 75.0 (*) 80.0 - 100.0 (mmHg)   Bicarbonate 24.2 (*) 20.0 - 24.0 (mEq/L)   TCO2 25  0 - 100 (mmol/L)   O2 Saturation 95.0     Acid-base deficit 1.0  0.0 - 2.0 (mmol/L)   Patient temperature 98.6 F     Collection site RADIAL, ALLEN'S TEST ACCEPTABLE     Drawn by Operator     Sample type ARTERIAL     Lab workup shows hyperglycemia.  No DKG.  Glucose gradually coming down with hydration, IV glucose stablizer protocol.  Call to have patient admitted to Internal Medicine Teaching Service to  a telemetry bed, Dr. Doneen Poisson attending.   1. Hyperglycemia            Carleene Cooper III, MD 12/30/11 [phone]

## 2011-12-30 NOTE — ED Notes (Signed)
Spouse/significant other stated she was leaving briefly; gave contact number in case of emergent need (Geneise [phone])

## 2011-12-30 NOTE — ED Provider Notes (Signed)
History     CSN: [id number]  Arrival date & time 12/30/11  1113   First MD Initiated Contact with Patient 12/30/11 1133      Chief Complaint  Patient presents with  . Dizziness    (Consider location/radiation/quality/duration/timing/severity/associated sxs/prior treatment) HPI Comments: 31 year old male with history of irregular heartbeat. With no known prior history of diabetes. Here complaining of dizziness and heartburn for 4 days. Also reports polyuria polydipsia and and thirst. Denies fever or chills. No burning on urination. Has felt rapid heartbeat with worsening dizziness today reason why he decided to come here. Denies abdominal pain nausea vomiting or diarrhea.    Past Medical History  Diagnosis Date  . Pain, dental   . PVC (premature ventricular contraction)     LBB inferior axis QRS  . Dizziness   . Right bundle branch block   . DJD (degenerative joint disease)     Past Surgical History  Procedure Date  . Knee arthroscopy     bilateral    Family History  Problem Relation Age of Onset  . Diabetes      History  Substance Use Topics  . Smoking status: Never Smoker   . Smokeless tobacco: Not on file  . Alcohol Use: No      Review of Systems  Constitutional: Positive for fatigue. Negative for fever and chills.  Respiratory: Negative for cough, chest tightness and shortness of breath.   Gastrointestinal: Negative for nausea, vomiting and diarrhea.       Reports acid reflux and epigastric discomfort.  Genitourinary: Negative for dysuria, urgency, hematuria and flank pain.       Polyuria  Musculoskeletal: Positive for myalgias.       Muscle cramps  Skin: Negative for rash.  Neurological: Positive for dizziness. Negative for tremors, seizures, weakness and numbness.    Allergies  Review of patient's allergies indicates no known allergies.  Home Medications   Current Outpatient Rx  Name Route Sig Dispense Refill  . ASPIRIN 81 MG PO TABS Oral Take  81 mg by mouth daily.    Marland Kitchen PEPTO-BISMOL PO Oral Take by mouth.      BP 109/72  Pulse 140  Temp(Src) 98.3 F (36.8 C) (Oral)  Resp 20  SpO2 97%  Physical Exam  Nursing note and vitals reviewed. Constitutional: He is oriented to person, place, and time. He appears well-developed and well-nourished.        Anxious.  HENT:  Head: Normocephalic and atraumatic.  Mouth/Throat: Oropharynx is clear and moist. No oropharyngeal exudate.  Eyes: Conjunctivae and EOM are normal. Pupils are equal, round, and reactive to light. No scleral icterus.  Neck: Normal range of motion. Neck supple. No JVD present.  Cardiovascular: Exam reveals no gallop and no friction rub.   No murmur heard.      Tachycardic  Pulmonary/Chest: Breath sounds normal.  Abdominal: Soft. There is no tenderness.  Lymphadenopathy:    He has no cervical adenopathy.  Neurological: He is alert and oriented to person, place, and time.  Skin: No rash noted.    ED Course  Procedures (including critical care time)  Labs Reviewed  POCT I-STAT, CHEM 8 - Abnormal; Notable for the following:    Sodium 131 (*)    Glucose, Bld >700 (*)    Hemoglobin 18.7 (*)    HCT 55.0 (*)    All other components within normal limits  URINE RAPID DRUG SCREEN (HOSP PERFORMED)  DRUG SCREEN PANEL (SERUM)   No results found.  EKG: sinus tachycardia. Ventricular rate 127. Right bundle branch block (present on prior EKGs). No PVCs. No obvious ischemic changes.   1. Hyperglycemia   2. Dehydration       MDM   31 year old male with history of irregular heartbeat. With no known prior history of diabetes. Here with  Sinus tachycard heart rate in the 120s to 140s. Orthostatic and dehydrated . Patient found to be hyperglycemic with a blood glucose above 700, creatinine 1.3 . IV fluids iniciated with normal saline 500 mL bolus. Heart rate remains in the 120s 130s. Oriented with good mentation. Afebrile . Patient with a diabetic hyperosmolar state  with moderate to severe dehydration. Decided to transfer to the emergency department for further evaluation and management. Patient was transferred to the ED via [facility]. No insulin treatment has been initiated at the Urgent Care Center.          Sharin Grave, MD 12/30/11 2313

## 2011-12-30 NOTE — ED Notes (Signed)
C/o dizziness for 4 days, and "acid reflex " described as "burning in throat".  Reports today symptoms are the worse. Reports similar symptoms at a previous visit, irregular heart beat, holter monitor, and cardiology consult.  Reports not following up at 6 month check up (january 2013)

## 2011-12-30 NOTE — ED Notes (Signed)
Wife at bedside.

## 2011-12-31 ENCOUNTER — Inpatient Hospital Stay (HOSPITAL_COMMUNITY): Payer: Self-pay

## 2011-12-31 DIAGNOSIS — R0902 Hypoxemia: Secondary | ICD-10-CM | POA: Diagnosis present

## 2011-12-31 DIAGNOSIS — R7309 Other abnormal glucose: Secondary | ICD-10-CM

## 2011-12-31 LAB — GLUCOSE, CAPILLARY
Glucose-Capillary: 373 mg/dL — ABNORMAL HIGH (ref 70–99)
Glucose-Capillary: 255 mg/dL — ABNORMAL HIGH (ref 70–99)
Glucose-Capillary: 209 mg/dL — ABNORMAL HIGH (ref 70–99)
Glucose-Capillary: 160 mg/dL — ABNORMAL HIGH (ref 70–99)
Glucose-Capillary: 245 mg/dL — ABNORMAL HIGH (ref 70–99)
Glucose-Capillary: 266 mg/dL — ABNORMAL HIGH (ref 70–99)
Glucose-Capillary: 333 mg/dL — ABNORMAL HIGH (ref 70–99)

## 2011-12-31 LAB — BASIC METABOLIC PANEL
BUN: 12 mg/dL (ref 6–23)
GFR calc Af Amer: >90 mL/min (ref >90)
GFR calc non Af Amer: 89 mL/min — ABNORMAL LOW (ref >90)
Potassium: 4.1 mEq/L (ref 3.5–5.1)
Sodium: 138 mEq/L (ref 135–145)

## 2011-12-31 LAB — BLOOD GAS, ARTERIAL
Acid-base deficit: 0.9 mmol/L (ref 0.0–2.0)
Drawn by: 32470
O2 Content: 1 L/min
O2 Saturation: 99.9 %
pCO2 arterial: 40.2 mmHg (ref 35.0–45.0)

## 2011-12-31 LAB — CARDIAC PANEL(CRET KIN+CKTOT+MB+TROPI)
Relative Index: 0.9 (ref 0.0–2.5)
Total CK: 326 U/L — ABNORMAL HIGH (ref 7–232)
Total CK: 325 U/L — ABNORMAL HIGH (ref 7–232)

## 2011-12-31 MED ORDER — LIVING WELL WITH DIABETES BOOK
Freq: Once | Status: AC
  Administered 2011-12-31: 11:00:00
  Filled 2011-12-31: qty 1

## 2011-12-31 MED ORDER — INSULIN GLARGINE 100 UNIT/ML ~~LOC~~ SOLN
10.0000 [IU] | Freq: Once | SUBCUTANEOUS | Status: AC
  Administered 2011-12-31: 10 [IU] via SUBCUTANEOUS

## 2011-12-31 MED ORDER — NYSTATIN 100000 UNIT/ML MT SUSP: 500000.0000 [IU] | Freq: Four times a day (QID) | OROMUCOSAL | Status: DC

## 2011-12-31 MED ORDER — ASPIRIN 81 MG PO CHEW
CHEWABLE_TABLET | ORAL | Status: AC
  Administered 2011-12-31: 81 mg
  Filled 2011-12-31: qty 1

## 2011-12-31 MED ORDER — INSULIN ASPART PROT & ASPART (70-30 MIX) 100 UNIT/ML ~~LOC~~ SUSP: SUBCUTANEOUS | Status: DC

## 2011-12-31 MED ORDER — INSULIN ASPART PROT & ASPART (70-30 MIX) 100 UNIT/ML ~~LOC~~ SUSP
50.0000 [IU] | Freq: Two times a day (BID) | SUBCUTANEOUS | Status: DC
  Filled 2011-12-31: qty 3

## 2011-12-31 MED ORDER — INSULIN ASPART 100 UNIT/ML ~~LOC~~ SOLN: 0.0000 [IU] | Freq: Three times a day (TID) | SUBCUTANEOUS | Status: DC

## 2011-12-31 MED ORDER — INSULIN ASPART 100 UNIT/ML ~~LOC~~ SOLN: 0.0000 [IU] | Freq: Every day | SUBCUTANEOUS | Status: DC

## 2011-12-31 MED ORDER — BD GETTING STARTED TAKE HOME KIT: 1/2ML X 30G SYRINGES
1.0000 | Freq: Once | Status: DC
  Filled 2011-12-31: qty 1

## 2011-12-31 MED ORDER — INSULIN ASPART 100 UNIT/ML ~~LOC~~ SOLN
0.0000 [IU] | Freq: Once | SUBCUTANEOUS | Status: AC
  Administered 2011-12-31: 15 [IU] via SUBCUTANEOUS

## 2011-12-31 NOTE — Progress Notes (Signed)
Clinical Social Work Department BRIEF PSYCHOSOCIAL ASSESSMENT 12/31/2011  Patient:  Roy Jennings     Account Number:  [id number]     Admit date:  12/30/2011  Clinical Social Worker:  Conley Simmonds  Date/Time:  12/31/2011 01:00 PM  Referred by:  Physician  Date Referred:  12/31/2011 Referred for  Other - See comment   Other Referral:   Adjustment to illness and financial concerns   Interview type:  Patient Other interview type:    PSYCHOSOCIAL DATA Living Status:  WIFE Admitted from facility:   Level of care:   Primary support name:  Medical illustrator Primary support relationship to patient:  SPOUSE Degree of support available:   Adequate    CURRENT CONCERNS Current Concerns  Adjustment to Illness   Other Concerns:   Financial Concerns    SOCIAL WORK ASSESSMENT / PLAN CSW met with pt at bedside to discuss new DM diagnosis-Pt wife also at bedside. Pt and wife overwhelmed by diagnosis and pt needs re: lack of insurance. Pt recently unemployed and pt wife is working relief and therefore not eligible for benefits. Pt has already received call from financial counselor who will f/u with pt  CSW discussed the resources available including orange card/Map/DM [facility] counselor. Pt and wife had medicaid appointment scheduled for [place] with regards to son and CSW encouraged pt and wife to discuss possible additional financial resources with DSS worker. CSW provided contact information in order for pt to f/u with any additional needs or concerns-   Assessment/plan status:  Psychosocial Support/Ongoing Assessment of Needs Other assessment/ plan:   Information/referral to community resources:   DSS/MAP/OPCSW    PATIENT'S/FAMILY'S RESPONSE TO PLAN OF CARE: Pt and wife visibly overwhelmed due to pt's unemployment and impact of DM diagnosis on emotional/mental/physical health-CSW provided validation of concerns and reassurance that pt will be appropriately followed by [facility] both as PCP as  well as SW and diabetes coordinator provided contact information for pt wife to f/u with any questions or concerns.  [name], [phone]

## 2011-12-31 NOTE — H&P (Signed)
Internal Medicine Attending Admission Note Date: 12/31/2011  Patient name: Roy Jennings Medical record number: [id number] Date of birth: [birth year] Age: 31 y.o. Gender: male  I saw and evaluated the patient. I reviewed the resident's note and I agree with the resident's findings and plan as documented in the resident's note.  Chief Complaint(s): Polyuria and polydipsia.  History - key components related to admission:  Roy Jennings is a 31 year old man with a past medical history of a right bundle branch block who presents with a 3 to four-week history of increased urination, increased thirst, and lightheadedness upon standing. He also notes a sore throat making eating solid foods difficult. He denies nausea, vomiting, or diarrhea. He notes an occasional abdominal pain as well as lower extremity cramping during this time. He is on no medications, has never been told he has high sugars before, but has a family history of diabetes including his father, grandfather, mother, and grandmother. 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 Review of prior lab work reveals a fasting blood glucose of 107 in May 2012.  Physical Exam - key components related to admission:  Filed Vitals:   12/31/11 0500 12/31/11 0552 12/31/11 1020 12/31/11 1403  BP:  140/87  135/89  Pulse:  101  103  Temp:  98.5 F (36.9 C)  97.8 F (36.6 C)  TempSrc:  Oral  Oral  Resp:  20  18  Height:      Weight: 282 lb 9.6 oz (128.187 kg)     SpO2:  99% 100% 100%   General: Well-developed, well-nourished, man sitting comfortably in bed in no acute distress. HEENT: Acanthosis nigicans posteriorly.  Large tongue with teeth indentations. Lungs: Clear to auscultation bilaterally without wheezes, rhonchi, or rales. Heart: Regular rate and rhythm without murmurs, rubs, or gallops. Abdomen: Soft, nontender, active bowel sounds. Extremities: Without edema. Skin: Changes over the chest consistent  with tinea versicolor.  Lab results:  Basic Metabolic Panel:  Basename 12/31/11 0139 12/30/11 2104  NA 138 136  K 4.1 3.9  CL 100 96  CO2 29 27  GLUCOSE 235* 477*  BUN 12 13  CREATININE 1.08 1.06  CALCIUM 9.2 9.1  MG -- --  PHOS -- --   Liver Function Tests:  [facility] 12/30/11 1542  AST 24  ALT 55*  ALKPHOS 123*  BILITOT 0.4  PROT 8.1  ALBUMIN 3.9   CBC:  Basename 12/30/11 1930 12/30/11 1542  WBC 13.1* 13.8*  NEUTROABS -- 10.4*  HGB 14.4 15.2  HCT 41.4 44.8  MCV 79.3 79.7  PLT 330 349   Cardiac Enzymes:  Basename 12/31/11 0707 12/31/11 0139 12/30/11 1931  CKTOTAL 325* 326* 307*  CKMB 2.9 2.9 2.9  CKMBINDEX -- -- --  TROPONINI <0.30 <0.30 <0.30   CBG:  Basename 12/31/11 1056 12/31/11 0938 12/31/11 0829 12/31/11 0732 12/31/11 0639 12/31/11 0537  GLUCAP 333* 266* 245* 160* 134* 138*   Hemoglobin A1C:  Basename 12/30/11 1542  HGBA1C 13.4*   Urine Drug Screen:  Negative  Urinalysis:  Greater than 1000 glucose, trace hemoglobin, 40 ketones.  Misc. Labs:  Room air arterial blood gas 7.38/41/75  Imaging results:  Dg Chest 2 View  12/31/2011  *RADIOLOGY REPORT*  Clinical Data: Dizziness.  Hyperglycemia.  CHEST - 2 VIEW  Comparison: 12/24/2010  Findings: Low lung volumes.  No infiltrates or failure.  Cardiac size accentuated by the poor inspiratory effort  is within normal limits.  No osseous  abnormality.  Slight worsening aeration compared with prior.  IMPRESSION: Low  lung volumes.  No active infiltrates.  Original Report Authenticated By: Elsie Stain, M.D.   Other results:  EKG: Normal sinus tachycardia at 120 beats per minute, left axis deviation, right bundle branch block, T wave inversions in V1 through V3.  Assessment & Plan by Problem:  Roy Jennings is a 31 year old man who presents with polyuria and polydipsia. He was found to be hyperglycemic with a mild anion gap and on his 31nd birthday he was given a diagnosis of diabetes with mild  ketoacidosis and a concomitant contraction alkalosis. Review of old BMPs suggest he had impaired fasting glucose one year ago. Being overweight and having a family history that he does it is not surprising he now has diabetes. With the hyperglycemia he developed thrush which resulted in some dysphasia. The contraction alkalosis is related to the polyuria associated with the hyperglycemia. The cause of his hypoxemia is unclear. A shunt study revealed an elevated gradient even on 100% oxygen. Therefore there is initially concern he may have a shunt. As this is a chronic process and he is not at the hypoxic level to need oxygen therapy we will await his obtaining medical insurance before ordering an echocardiogram in order to avoid financial ruin for the patient and his family. If the bubble Echo is not suggestive of a shunt of any kind we could consider a VQ scan to look for chronic thromboembolic disease in a man with tachycardia and a right bundle branch block. He has no evidence of an acute pulmonary embolism at this point.  His enlarged tongue could be a risk factor for OSA or a sign of amyloid.  1) Diabetes: The diabetic educator was consulted to assist Roy Jennings in learning more about his new diagnosis. His gap is closed with the IV insulin and will be placed on 70/30 insulin upon discharge as this is the most economical insulin available at this time. He will followup in the [facility] Center where his diabetes will be managed chronically and his insulin adjusted appropriately.  2) Thrush: We'll treat with swish and swallow and follow symptoms in the Internal Medicine Center.  3) Hypoxemia: Once Roy Jennings obtains his insurance we will obtain a bubble echocardiogram to look for a shunt given his elevated Aa gradient after a shunt study. If this is unremarkable would recommend getting PFTs to assess for obstruction, restriction, or a diffusion defect that may give a clue as to where his hypoxemia  may be originating from. If he has a decrease in his diffusing capacity a VQ scan to look for chronic thromboembolic disease would not be an unreasonable first step. If he had mild restriction or decrease in his diffusing capacity one should also consider getting a fat pad biopsy to assess for amyloidosis in this man with a very large tongue and associated teeth indentations.  4) Disposition: I agree with the housestaff's plan to discharge Mr. Salvucci home today after his diabetic teaching and instruction on how to give himself insulin. As mentioned above, we will follow him up in the [facility] Medicine Center for chronic management of the diabetes as well as further evaluation for the hypoxemia once he obtains insurance coverage.

## 2011-12-31 NOTE — Progress Notes (Signed)
0730 cbg reading = 180 made a call to dr  Thad Ranger with  Orders 0800 lantus 10 units given [place] . IV changed to D5 0.45 at 150 mls /hr  Made the pt aware of intervention . Ate breakfast  0900 Insulin drip d/cd as order  1100 seen by Dr. Josem Kaufmann discussed mgt plan with pt.  abg drawn on 100 % nrb as ordered . tole rated well

## 2011-12-31 NOTE — Progress Notes (Signed)
1445 . Insulin administration explained  with return demonstration from pt  Done. Pt watched video about DM . brochure re; living well with diabetes provided to pt . Seen by Dietetian with spouse in attendance asked appropriate questions

## 2011-12-31 NOTE — Plan of Care (Signed)
Problem: Food- and Nutrition-Related Knowledge Deficit (NB-1.1) Goal: Nutrition education Formal process to instruct or train a patient/client in a skill or to impart knowledge to help patients/clients voluntarily manage or modify food choices and eating behavior to maintain or improve health.  Outcome: Completed/Met Date Met:  12/31/11 RD was consulted for DM education for pt with new onset type 2. Pt and significant other present. RD explained carbohydrate containing foods, carbohydrate counting, serving sizes and meal planning tips with pt. RD provided pt with Nutrition Therapy for type 2 Diabetics handout. Pt asked appropriate questions. Pts significant other appeared overwhelmed with this new diagnosis and the need for insulin injections. RD explained the role of diet and nutrition in controlling blood sugar and the use of insulin. Pt verbalized understanding and expressed readiness to change. RD expects fair compliance. RD reviewed chart. Pt triggered for recent weight loss, likely related to uncontrolled blood sugars. PO intake adequate. Some weight loss for this pt is desirable given bmi.  No additional nutrition interventions at this time. Please consult as needed.  Body mass index is 39.41 kg/(m^2). Pt is obese.   Roy Jennings

## 2011-12-31 NOTE — Progress Notes (Signed)
1230 Paged and spoken with Dr . Thad Ranger Rev cbg results w/o insulin drip on = 333 . With orders

## 2011-12-31 NOTE — Progress Notes (Addendum)
INTERNAL MEDICINE DAILY PROGRESS NOTE  Roy Jennings MRN: [id number] DOB/AGE: Mar 16, 1981 31 y.o.  Admit date: 12/30/2011  Subjective: Roy Jennings states he is doing well today. He states his sore throat is still present. Denies headache, nausea, vomiting, chest pain, shortness of breath, fever, chills, abdominal pain, swelling or other complaint.  Objective: Vital signs in last 24 hours: Filed Vitals:   12/31/11 0207 12/31/11 0500 12/31/11 0552 12/31/11 1020  BP: 108/67  140/87   Pulse: 91  101   Temp: 98.2 F (36.8 C)  98.5 F (36.9 C)   TempSrc: Oral  Oral   Resp: 18  20   Height:      Weight:  282 lb 9.6 oz (128.187 kg)    SpO2: 100%  99% 100%   Weight change:   Intake/Output Summary (Last 24 hours) at 12/31/11 1227 Last data filed at 12/31/11 0858  Gross per 24 hour  Intake 2125.57 ml  Output    550 ml  Net 1575.57 ml   Physical Exam: General: resting in bed HEENT: PERRL, EOMI, no scleral icterus Cardiac: RRR, no rubs, murmurs or gallops Pulm: clear to auscultation bilaterally, moving normal volumes of air Abd: soft, nontender, nondistended, BS present Ext: warm and well perfused, no pedal edema Neuro: alert and oriented X3, cranial nerves II-XII grossly intact Lab Results: Basic Metabolic Panel:  Lab 12/31/11 5284 12/30/11 2104  NA 138 136  K 4.1 3.9  CL 100 96  CO2 29 27  GLUCOSE 235* 477*  BUN 12 13  CREATININE 1.08 1.06  CALCIUM 9.2 9.1  MG -- --  PHOS -- --   Liver Function Tests:  Lab 12/30/11 1542  AST 24  ALT 55*  ALKPHOS 123*  BILITOT 0.4  PROT 8.1  ALBUMIN 3.9   CBC:  Lab 12/30/11 1930 12/30/11 1542  WBC 13.1* 13.8*  NEUTROABS -- 10.4*  HGB 14.4 15.2  HCT 41.4 44.8  MCV 79.3 79.7  PLT 330 349   Cardiac Enzymes:  Lab 12/31/11 0707 12/31/11 0139 12/30/11 1931  CKTOTAL 325* 326* 307*  CKMB 2.9 2.9 2.9  CKMBINDEX -- -- --  TROPONINI <0.30 <0.30 <0.30   CBG:  Lab 12/31/11 1056 12/31/11 0938 12/31/11 0829 12/31/11 0732  12/31/11 0639 12/31/11 0537  GLUCAP 333* 266* 245* 160* 134* 138*   Hemoglobin A1C:  Lab 12/30/11 1542  HGBA1C 13.4*   Urine Drug Screen: Drugs of Abuse     Component Value Date/Time   LABOPIA NONE DETECTED 12/30/2011 1519   COCAINSCRNUR NONE DETECTED 12/30/2011 1519   LABBENZ NONE DETECTED 12/30/2011 1519   AMPHETMU NONE DETECTED 12/30/2011 1519   THCU NONE DETECTED 12/30/2011 1519   LABBARB NONE DETECTED 12/30/2011 1519    Urinalysis:  Lab 12/30/11 1519  COLORURINE YELLOW  LABSPEC 1.044*  PHURINE 5.0  GLUCOSEU >1000*  HGBUR TRACE*  BILIRUBINUR NEGATIVE  KETONESUR 40*  PROTEINUR 100*  UROBILINOGEN 0.2  NITRITE NEGATIVE  LEUKOCYTESUR NEGATIVE    Studies/Results: Dg Chest 2 View  12/31/2011  *RADIOLOGY REPORT*  Clinical Data: Dizziness.  Hyperglycemia.  CHEST - 2 VIEW  Comparison: 12/24/2010  Findings: Low lung volumes.  No infiltrates or failure.  Cardiac size accentuated by the poor inspiratory effort  is within normal limits.  No osseous abnormality.  Slight worsening aeration compared with prior.  IMPRESSION: Low  lung volumes.  No active infiltrates.  Original Report Authenticated By: Elsie Stain, M.D.   Medications: I have reviewed the patient's current medications. Scheduled Meds:   .  sodium chloride  1,000 mL Intravenous Once   Followed by  . sodium chloride  1,000 mL Intravenous Once  . sodium chloride   Intravenous STAT  . aspirin      . aspirin EC  81 mg Oral Daily  . enoxaparin  40 mg Subcutaneous Q24H  . insulin aspart  0-5 Units Subcutaneous QHS  . insulin aspart  0-9 Units Subcutaneous TID WC  . insulin glargine  10 Units Subcutaneous Once  . living well with diabetes book   Does not apply Once  . nystatin  5 mL Oral QID  . potassium chloride  10 mEq Intravenous Q1H   Continuous Infusions:   . sodium chloride 1,000 mL (12/31/11 0427)  . sodium chloride    . DISCONTD: sodium chloride    . DISCONTD: dextrose 5 % and 0.45% NaCl 150 mL/hr at  12/31/11 0800  . DISCONTD: insulin (NOVOLIN-R) infusion 2.7 Units/hr (12/30/11 1854)  . DISCONTD: insulin (NOVOLIN-R) infusion Stopped (12/31/11 0906)   PRN Meds:.dextrose Assessment/Plan: Principal Problem: 1) Diabetic Ketoacidosis - Mr. Osterloh has no prior history of diabetes mellitus, but his A1c of 13 therefore, this acute presentation likely does represent new diabetes diagnosis. He was placed on glucomander protocol and AG closed. He has been started on lantus and SSI as well as diabetic diet. We ordered diabetic teaching and will start insulin 70/30 as that will be the most cost effective solution until the patient can get healthcare coverage. Will start 0.4 units per kilogram per day of insulin 70/30. We will have him follow up in our clinic and plan on discharging today after he learns to draw up his inusulin at dinner. Cardiac markers negative x 3. - Diabetes education, as patient is being newly diagnosed.  - follow TSH  - CM to assess for financial programs to aid in assistance with insulin.  -- will discharge on insulin 70/30 50 units BID cc.  2) Hypoxemia - on room air the patient has a PO2 of 75 with an AA gradient of 22, while the expected AA gradient for his age is 1. We placed the patient on 100% FiO2 for 30 minutes before repeating ABG. He again displayed a significant AA gradient and dictating that he has a shunt. As this is a chronic stable problem and the patient has no insurance we will elect to work this up as an outpatient. He will need to followup with his cardiologist Dr. Elease Hashimoto for a 2-D echo with bubble study. Differential now includes either intracardiac or intrapulmonary shunt.  3) Metabolic derangements - anion gap closed and potassium stable.   4) Right bundle branch block - appears stable from September 2012. Patient received normal stress echo in June of 2012 for PVCs per the clinic notes from Dr. Eden Emms. A cardiac MRI was suggested, in consultation with his  cardiologist this test was deferred. Will continue to monitor, but suspect this is related to problem #2.   5) Thush - likely secondary to uncontrolled diabetes. will treat with nystatin 500,000 units 4 times a day for 48 hours after his symptoms resolve   6) Leukocytosis - likely secondary to stress hyperglycemia in setting of acute DKA. UA negative for evidence of UTI.  - Will monitor, and continue to monitor for signs/ symptoms of infection.  - Check CXR.   7) morbid obesity - BMI 38.8.  counseled patient regarding diet modification. We will have him follow up in our clinic for further management.     LOS:  1 day   [facility], Zeniah Briney PGY-I, Internal Medicine Resident  Pager: [phone] (7AM-5PM) 12/31/2011, 12:27 PM

## 2011-12-31 NOTE — Progress Notes (Signed)
Inpatient Diabetes Program Recommendations  AACE/ADA: New Consensus Statement on Inpatient Glycemic Control (2009)  Target Ranges:  Prepandial:   less than 140 mg/dL      Peak postprandial:   less than 180 mg/dL (1-2 hours)      Critically ill patients:  140 - 180 mg/dL   Reason for Visit: New onset DM  Results for Roy Jennings, Roy Jennings (MRN [id number]) as of 12/31/2011 12:08  Ref. Range 12/31/2011 06:39 12/31/2011 07:32 12/31/2011 08:29 12/31/2011 09:38 12/31/2011 10:56    134 drip d/c'd 160 (H) 245 (H) 266 (H) 333 (H)    Inpatient Diabetes Program Recommendations Insulin - IV drip/GlucoStabilizer: Drip discontnued at 0640 this am.  No correction given at that time.  No basal Lantus given until 2 hrs after drip discontinued.  Glucose now back into 300's.  No correction scheduled to be given until 1630 this afternoon. Insulin - Basal: Pt is 128 kg. At minimal starting dose of 0.2 units per kg, pt will need start with at least 25 units Lantus.  Pt should receive basal insulin at least 1-2 hrs before IV insulin drip is discontinued, as half-life of IV insulin is approximately 7 units.   Correction (SSI): Pt is morbidly obese. Needs moderate to resistant correction tidwv plus HS correction Insulin - Meal Coverage: Would use meal coverage while here to rest his beta cells for present time.  Then could switch to a sulfonylurea, i.e. Amaryl 4 mg daily. Oral Agents: Metformin starting at 500 mg daily or bid to start.  Note: Please start correction insulin asap as well as Lantus increase to total of 25 units for the day today. Thank you, Lenor Coffin, RN, CNS, Diabetes Coordinator [phone])

## 2011-12-31 NOTE — Discharge Instructions (Addendum)
YOU MUST ATTEND YOUR FOLLOW-UP APPOINTMENT WITH DR. [facility] ON June 4.  Paperwork that needs to be turned in to [employer] FOR ORANGE CARD ELIGIBILITY APPLICATION   1. Picture ID (Can't be expired) 2. Current Bill to establish proof of residency 3. W-2 & Tax return (if self-employed include "Schedule C"), if not filing Form 4506 4. 4 current Pay stubs for this year 5. Printout of other income (Social security, unemployment, child support, workmen's comp) 6. Food stamp award letter, if receiving  7. Life Insurance (Need copy of the front page, showing name Ins Co. Name, and face amount). 8. Statement for pension, 401-K, IRS (needs to have current balance) 9. Tax Value for cars, houses, mobile homes, and land (Get from [facility] Tax Department) 10. Disability Paperwork (showing status of case) 11. College students: Print out of [place] received, tuition cost, books, etc. 12. If no Income: Engineer, maintenance of support for free shelter, money, food, Catering manager.  Bring all that you can to your follow up appointment to start the process.    Diabetes, Frequently Asked Questions WHAT IS DIABETES? Most of the food we eat is turned into glucose (sugar). Our bodies use it for energy. The pancreas makes a hormone called insulin. It helps glucose get into the cells of our bodies. When you have diabetes, your body either does not make enough insulin or cannot use its own insulin as well as it should. This causes sugars to build up in your blood. WHAT ARE THE SYMPTOMS OF DIABETES?  Frequent urination.   Excessive thirst.   Unexplained weight loss.   Extreme hunger.   Blurred vision.   Tingling or numbness in hands or feet.   Feeling very tired much of the time.   Dry, itchy skin.   Sores that are slow to heal.   Yeast infections.  WHAT ARE THE TYPES OF DIABETES? Type 1 Diabetes   About 10% of affected people have this type.   Usually occurs before the age of 64.   Usually occurs  in thin to normal weight people.  Type 2 Diabetes  About 90% of affected people have this type.   Usually occurs after the age of 6.   Usually occurs in overweight people.   More likely to have:   A family history of diabetes.   A history of diabetes during pregnancy (gestational diabetes).   High blood pressure.   High cholesterol and triglycerides.  Gestational Diabetes  Occurs in about 4% of pregnancies.   Usually goes away after the baby is born.   More likely to occur in women with:   Family history of diabetes.   Previous gestational diabetes.   Obese.   Over 77 years old.  WHAT IS PRE-DIABETES? Pre-diabetes means your blood glucose is higher than normal, but lower than the diabetes range. It also means you are at risk of getting type 2 diabetes and heart disease. If you are told you have pre-diabetes, have your blood glucose checked again in 1 to 2 years. WHAT IS THE TREATMENT FOR DIABETES? Treatment is aimed at keeping blood glucose near normal levels at all times. Learning how to manage this yourself is important in treating diabetes. Depending on the type of diabetes you have, your treatment will include one or more of the following:  Monitoring your blood glucose.   Meal planning.   Exercise.   Oral medicine (pills) or insulin.  CAN DIABETES BE PREVENTED? With type 1 diabetes, prevention is more difficult,  because the triggers that cause it are not yet known. With type 2 diabetes, prevention is more likely, with lifestyle changes:  Maintain a healthy weight.   Eat healthy.   Exercise.  IS THERE A CURE FOR DIABETES? No, there is no cure for diabetes. There is a lot of research going on that is looking for a cure, and progress is being made. Diabetes can be treated and controlled. People with diabetes can manage their diabetes and lead normal, active lives. SHOULD I BE TESTED FOR DIABETES? If you are at least 32 years old, you should be tested for  diabetes. You should be tested again every 3 years. If you are 45 or older and overweight, you may want to get tested more often. If you are younger than 45, overweight, and have one or more of the following risk factors, you should be tested:  Family history of diabetes.   Inactive lifestyle.   High blood pressure.  WHAT ARE SOME OTHER SOURCES FOR INFORMATION ON DIABETES? The following organizations may help in your search for more information on diabetes: National Diabetes Education Program (NDEP) Internet: [employer] American Diabetes Association Internet: http://www.diabetes.org  Juvenile Diabetes Foundation International Internet: [employer] Document Released: 07/23/2003 Document Revised: 07/09/2011 Document Reviewed: 05/17/2009 [facility] Patient Information 2012 [place], [place].  How and Where to Give Insulin Injections, Adult People with type 1 diabetes must take insulin since their bodies do not make it. People with type 2 diabetes may require insulin. There are many different types of insulin. The type of insulin you take will determine how many injections you will take and when to take the injections.  CHOOSING A SITE FOR INJECTION Insulin absorption varies from site to site. It is best to inject insulin within the same body region. Do not inject the insulin in the same spot for each injection. There are 4 main regions that can be used for injecting. The regions include the:  Abdomen (this is the preferred site).   Front and upper outer sides of thighs.   Back of upper arm.   Buttocks.  USING A SYRINGE AND VIAL Drawing up insulin: single insulin dose 1. Wash your hands with soap and water.  2. Warm the medicine by gently rolling the bottle (vial) between your hands. Do not shake the vial.  3. Clean the top rubber part of the vial with an alcohol wipe. Be sure that the plastic pop-top has been removed on newer vials.  4. Remove the plastic  cover from the needle on the syringe. Do not let the needle touch anything.  5. Pull the plunger back to draw air into the syringe. The air should be the same amount as the insulin dose.  6. Push the needle through the rubber on the top of the vial. Do not turn the vial over.  7. Push the plunger in all the way to put the air into the vial.  8. Leave the needle in the vial and turn the vial and syringe upside down.  9. Pull down slowly on the plunger, drawing the amount of insulin you need into the syringe.  10. Look for air bubbles in the syringe. You may need to push the plunger up and down 2 to 3 times to slowly get rid of any air bubbles in the syringe.  11. Pull back the plunger to get your correct dose.  12. Remove the needle from the vial.  13. Use an alcohol wipe to clean the area of the body  to be injected.  14. Pinch up 1 inch of skin and hold it.  15. Put the needle straight into the skin (90-degree angle). Put the needle in as far as it will go (to the hub). The needle may need to be injected at a 45-degree angle in small adults with little fat.  16. When the needle is in, you can let go of your skin.  17. Push the plunger down all the way to inject the insulin.  18. Pull the needle straight out of the skin.  19. Press the alcohol wipe over the spot where you gave your injection. Keep it there for a few seconds. Do not rub the area.  20. Do not put the plastic cover back on the needle.  Drawing up insulin: mixing 2 insulins 1. Wash your hands with soap and water.  2. Roll the vial of "cloudy" insulin between your hands or rotate the vial from top to bottom to mix.  3. Clean the top of both vials with an alcohol wipe. Be sure that the plastic pop-top lid has been removed on newer vials.  4. Pull air into the syringe to equal the dose of "cloudy" insulin.  5. Stick the needle into the "cloudy" insulin vial and inject the air. Be sure to keep the vial upright.  6. Remove the needle  from the "cloudy" insulin vial.  7. Pull air into the syringe to equal the dose of "clear" insulin.  8. Stick the needle into the "clear" insulin vial and inject the air.  9. Leave the needle in the "clear" insulin vial and turn the vial upside down.  10. Pull down on the plunger and slowly draw into the syringe the number of units of "clear" insulin desired.  11. Look for air bubbles in the syringe. You may need to push the plunger up and down 2 to 3 times to slowly get rid of any air bubbles in the syringe.  12. Remove the needle from the "clear" insulin vial.  13. Stick the needle into the "cloudy" insulin vial. Do not inject any of the "clear" insulin into the "cloudy" vial.  14. Turn the "cloudy" vial upside down and pull the plunger down to the number of units that equals the total number of units of "clear" and "cloudy" insulins.  15. Remove the needle from the "cloudy" insulin vial.  16. Use an alcohol wipe to clean the area of the body to be injected.  17. Put the needle straight into the skin (90-degree angle). Put the needle in as far as it will go (to the hub). The needle may need to be injected at a 45-degree angle in small adults with little fat.  18. When the needle is in, you can let go of your skin.  19. Push the plunger down all the way to inject the insulin.  20. Pull the needle straight out of the skin.  21. Press the alcohol wipe over the spot where you gave your injection. Keep it there for a few seconds. Do not rub the area.  22. Do not put the plastic cover back on the needle.  USING INSULIN PENS 1. Wash your hands with soap and water.  2. If you are using the "cloudy" insulin, roll the pen between your palms several times or rotate the pen top to bottom several times.  3. Remove the insulin pen cap.  4. Clean the rubber stopper of the cartridge with an alcohol wipe.  5.  Remove the protective paper tab from the disposable needle.  6. Screw the needle onto the pen.   7. Remove the outer plastic needle cover.  8. Remove the inner plastic needle cover.  9. Prime the insulin pen by turning the button (dial) to 2 units. Hold the pen with the needle pointing up, and push the dial on the opposite end until a drop of insulin appears at the needle tip. If no insulin appears, repeat this step.  10. Dial the number of units of insulin you will inject.  11. Use an alcohol wipe to clean the area of the body to be injected.  12. Pinch up 1 inch of skin and hold it.  13. Put the needle straight into the skin (90-degree angle).   High Blood Sugar High blood sugar (hyperglycemia) means that the level of sugar in your blood is higher than it should be. Signs of high blood sugar include:  Feeling thirsty.   Frequent peeing (urinating).   Feeling tired or sleepy.   Dry mouth.   Vision changes.   Feeling weak.   Feeling hungry but losing weight.   Numbness and tingling in your hands or feet.   Headache.  When you ignore these signs, your blood sugar may keep going up. These problems may get worse, and other problems may begin. HOME CARE  Check your blood sugars as told by your doctor. Write down the numbers with the date and time.   Take the right amount of insulin or diabetes pills at the right time. Write down the dose with date and time.   Refill your insulin or diabetes pills before running out.   Watch what you eat. Follow your meal plan.   Drink liquids without sugar, such as water. Check with your doctor if you have kidney or heart disease.   Follow your doctor's orders for exercise. Exercise at the same time of day.   Keep your doctor's appointments.  GET HELP RIGHT AWAY IF:   You have trouble thinking or are confused.   You have fast breathing with fruity smelling breath.   You pass out (faint).   You have 2 to 3 days of high blood sugars and you do not know why.   You have chest pain.   You are feeling sick to your stomach  (nauseous) or throwing up (vomiting).   You have sudden vision changes.  MAKE SURE YOU:   Understand these instructions.   Will watch your condition.   Will get help right away if you are not doing well or get worse.  Document Released: 05/17/2009 Document Revised: 07/09/2011 Document Reviewed: 05/17/2009 [facility] Patient Information 2012 [place], [place]. 14. Push the dial down to push the insulin into the fat tissue.  15. Count to 10 slowly. Then, remove the needle from the fat tissue.  16. Carefully replace the larger outer plastic needle cover over the needle and unscrew the capped needle.  THROWING AWAY SUPPLIES  Discard used needles in a puncture proof sharps disposal container. Follow disposal regulations for the area where you live.   Vials and empty disposable pens may be thrown away in the regular trash.  Document Released: 10/10/2003 Document Revised: 07/09/2011 Document Reviewed: 03/03/2011 [facility] Patient Information 2012 [place], [place].

## 2011-12-31 NOTE — Progress Notes (Signed)
1500 seen and evaluated by Dr . Darci Current discussed plan  dicharge plan and instructions  Pa and spouse verbalized understanding  Discharged ambulatory

## 2012-01-04 LAB — DRUG SCREEN PANEL (SERUM)

## 2012-01-04 NOTE — Discharge Summary (Signed)
[facility] Discharge Note  Name: Roy Jennings MRN: [id number] DOB: [birth year] 31 y.o.  Date of Admission: 12/30/2011  2:32 PM Date of Discharge: 12/31/2011 Attending Physician: Mariana Arn  Discharge Diagnosis: Principal Problem: Diabetic ketoacidosis and Diabetes mellitus Active Problems: Hypoxemia Right bundle-branch block Morbid obesity Thrush  Discharge Medications: Medication List  As of 01/04/2012  5:39 PM   STOP taking these medications         bismuth subsalicylate 262 MG/15ML suspension         TAKE these medications         aspirin EC 81 MG tablet   Take 81 mg by mouth daily.      insulin aspart protamine-insulin aspart (70-30) 100 UNIT/ML injection   Commonly known as: NOVOLOG 70/30   Any generic formulation of insulin 70/30 is acceptable.      nystatin 100000 UNIT/ML suspension   Commonly known as: MYCOSTATIN   Take 5 mLs (500,000 Units total) by mouth 4 (four) times daily. Take until your throat feels 100% better, then for 2 additional days (48 hours) after that.            Disposition and follow-up:   Mr.Jarvis Dipaola was discharged from [facility] in Good condition.  He had received diabetes education via a dietitian and diabetes educator during his admission. He also received diabetes instruction from his RN as to how to check his blood sugars and deliver insulin.   FOLLOW UP APPOINTMENTS Follow-up Information    Follow up with Quentin Ore, MD on 01/05/2012. (appointment is at 815 AM. )    Contact information:   [address] [place] [address] [phone]           Follow-up Appointments: Discharge Orders    Future Appointments: Provider: Department: Dept Phone: Center:   01/05/2012 8:15 AM Duwaine Maxin, MD [facility] [phone] [facility]     Future Orders Please Complete By Expires   Diet - low sodium heart healthy      Diet general      Increase activity  slowly      Call MD for:  temperature >100.4      Call MD for:  persistant nausea and vomiting      Call MD for:  severe uncontrolled pain      Call MD for:  redness, tenderness, or signs of infection (pain, swelling, redness, odor or green/yellow discharge around incision site)      Call MD for:  difficulty breathing, headache or visual disturbances      Call MD for:  persistant dizziness or light-headedness      Call MD for:  extreme fatigue      Call MD for:  hives      Call MD for:      Scheduling Instructions:   Excessive thirst or if you are peeing a lot.      At his followup appointment with Dr. Candy Sledge, The following item should be addressed: #1 please follow up with the patient regarding his ability to check his glucose level and give himself insulin #2 please refer the patient to his cardiologist so that he can have 2-D echo with bubble study performed to evaluate for either intracardiac or intrapulmonary shunt.  #3 please refer the patient to our clinic social worker so that he may begin enrolling in programs to assist him in affording his medications  Consultations:   none  Procedures  Performed:  Dg Chest 2 View  12/31/2011  *RADIOLOGY REPORT*  Clinical Data: Dizziness.  Hyperglycemia.  CHEST - 2 VIEW  Comparison: 12/24/2010  Findings: Low lung volumes.  No infiltrates or failure.  Cardiac size accentuated by the poor inspiratory effort  is within normal limits.  No osseous abnormality.  Slight worsening aeration compared with prior.  IMPRESSION: Low  lung volumes.  No active infiltrates.  Original Report Authenticated By: Elsie Stain, M.D.    Admission HPI: Patient is a 31 y.o. male with a PMHx of newly diagnosed right bundle branch block, who presents to [facility] for evaluation of a 3-4 history of increased frequency urination, and increased thirst. On the day of admission, had increased sensation of positional lightheadedness and a queasy feeling in his stomach. He states he  has had a lot of cramping of his legs. Has had reduced appetite over this time frame as well. Denies chest pain and shortness of breath. Had some sore throat, and has essentially only been eating liquid foods and Jell-O. He tried taking some pepto, which was minimally helpful. Drinking milk also makes his throat feel better. He anything acidic makes the pain worse. He has not noticed any white patches in his mouth. Has had reduced appetite over this time frame as well. Denies chest pain.    Admission Physical Exam and Labs: Vital Signs:  Blood pressure 132/75, pulse 119, temperature 97.8 F (36.6 C), temperature source Oral, resp. rate 16, SpO2 96.00%.  Physical Exam:  General:  Vital signs reviewed and noted. Well-developed, well-nourished, in no acute distress; alert, appropriate and cooperative throughout examination.   Head:  Normocephalic, atraumatic.   Eyes:  PERRL, EOMI, No signs of anemia or jaundince.   Nose:  Mucous membranes moist, not inflammed, nonerythematous.   Throat:  Oropharynx nonerythematous, no exudate appreciated.   Neck:  No deformities, masses, or tenderness noted.Supple, No carotid Bruits, no JVD.   Lungs:  Normal respiratory effort. Clear to auscultation BL without crackles or wheezes.   Heart:  RRR. S1 and S2 normal without gallop, murmur, or rubs.   Abdomen:  BS normoactive. Soft, Nondistended, non-tender. No masses or organomegaly.   Extremities No pretibial edema.   Neurologic:  A&O X3, CN II - XII are grossly intact. Motor strength is 5/5 in the all 4 extremities, Sensations intact to light touch, Cerebellar signs negative.   Skin:  No visible rashes, scars.   Lab results:  Basic Metabolic Panel:   Basename  12/30/11 1542  12/30/11 1336   NA  135  131*   K  4.8  4.5   CL  91*  99   CO2  26  --   GLUCOSE  448*  >700*   BUN  15  18   CREATININE  1.20  1.30   CALCIUM  10.2  --    Liver Function Tests:   Basename  12/30/11 1542   AST  24   ALT  55*     ALKPHOS  123*   BILITOT  0.4   PROT  8.1   ALBUMIN  3.9    ABG    Component  Value  Date/Time    PHART  7.377  12/30/2011 1554    PCO2ART  41.3  12/30/2011 1554    PO2ART  75.0*  12/30/2011 1554    HCO3  24.2*  12/30/2011 1554    TCO2  25  12/30/2011 1554    ACIDBASEDEF  1.0  12/30/2011 1554  O2SAT  95.0  12/30/2011 1554    CBC:   Basename  12/30/11 1542  12/30/11 1336   WBC  13.8*  --   NEUTROABS  10.4*  --   HGB  15.2  18.7*   HCT  44.8  55.0*   MCV  79.7  --   PLT  349  --    Basename  12/30/11 1747  12/30/11 1434   GLUCAP  332*  566*    Urine Drug Screen:  Drugs of Abuse    Component  Value  Date/Time    LABOPIA  NONE DETECTED  12/30/2011 1519    COCAINSCRNUR  NONE DETECTED  12/30/2011 1519    LABBENZ  NONE DETECTED  12/30/2011 1519    AMPHETMU  NONE DETECTED  12/30/2011 1519    THCU  NONE DETECTED  12/30/2011 1519    LABBARB  NONE DETECTED  12/30/2011 1519    Urinalysis:   Ref. Range  12/30/2011 15:19   Color, Urine  Latest Range: YELLOW  YELLOW   APPearance  Latest Range: CLEAR  CLEAR   Specific Gravity, Urine  Latest Range: 1.005-1.030  1.044 (H)   pH  Latest Range: 5.0-8.0  5.0   Glucose, UA  Latest Range: NEGATIVE mg/dL  >4098 (A)   Bilirubin Urine  Latest Range: NEGATIVE  NEGATIVE   Ketones, ur  Latest Range: NEGATIVE mg/dL  40 (A)   Protein  Latest Range: NEGATIVE mg/dL  119 (A)   Urobilinogen, UA  Latest Range: 0.0-1.0 mg/dL  0.2   Nitrite  Latest Range: NEGATIVE  NEGATIVE   Leukocytes, UA  Latest Range: NEGATIVE  NEGATIVE   Hgb urine dipstick  Latest Range: NEGATIVE  TRACE (A)   WBC, UA  Latest Range: <3 WBC/hpf  0-2   RBC / HPF  Latest Range: <3 RBC/hpf  0-2   Squamous Epithelial / LPF  Latest Range: RARE  RARE   Casts  Latest Range: NEGATIVE  HYALINE CASTS (A)      Hospital Course by problem list: ) Diabetic Ketoacidosis - Mr. Pritt has no prior history of diabetes mellitus, but his A1c is 13. Therefore, this acute presentation represented a new  diabetes diagnosis. No cause of DKA was identified. UDS was negative and cardiac markers were negative x3. His urine showed no infection. He was placed on glucomander protocol and AG rapidly closed. He was then started on lantus and SSI as well as diabetic diet. We ordered diabetic teaching and will started insulin 70/30 at discharge, as that will be the most cost effective solution until the patient can get healthcare coverage. Will started 0.4 units per kilogram per day of insulin 70/30. This equates to roughly 50 units twice a day with meals. We had intended to have the patient actually deliver his first dose of insulin with his evening meal, however he he and his wife were adamant that they leave for prior to this teaching taking place. They stated they had a meeting with the [employer] and were going to lose her house if they did not attend this meeting. I stated that the patient's diabetes education was not yet complete and that we could complete this in our clinic, however he had to promise me that he would attend his followup appointment with me. He had not yet had time to followup with the case manager to determine what financial programs might aid him in affording his medications. This will need to be done at his clinic appointment.  2)  Hypoxemia -  On admission, Mr. Chuck was found to have hypoxemia with a PO2 of 75. He also had an AA gradient of 22. The expected AA gradient for his age is 52. We placed the patient on 100% FiO2 for 30 minutes before repeating ABG. He again displayed a significant AA gradient that cannot be overcome with inhalation of 100% oxygen. This indicates that he has a shunt rather than VQ mismatch as cause for his hypoxemia. As this is a chronic, stable problem, and the patient has no insurance, we will elect to work this up as an outpatient. He will need to followup with his cardiologist Dr. Elease Hashimoto for a 2-D echo with bubble study. If bubble echocardiogram is negative for  intracardiac or intrapulmonary shunt, we would recommend getting PFTs to evaluate for possible obstruction, restriction,  or diffusion defect. If he has a deficit in his diffusion capacity V/Q scan could still be useful to determine if he does have chronic thromboembolic disease. 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 If no other source is found in workup a fat pad biopsy could be considered.  3) Right bundle branch block - right bundle branch block appeared stable from September 2012. Patient received normal stress echo in June of 2012 for PVCs per the clinic notes from Dr. Heywood Bene. A cardiac MRI was suggested, in consultation with his cardiologist this test was deferred. It is likely that the patient's right bundle branch block is related to his hypoxemia. Further workup of this issue was deferred until his hypoxemia is more elucidated.  4) Thush - Mr. Moreland had dysphagia and white plaques in his posterior oropharynx on exam. This is most likely thrush secondary to uncontrolled diabetes. We began treatment with nystatin 500,000 units 4 times a day. He is to take this medication for 48 hours after his symptoms resolve   5) morbid obesity - BMI 38.8. counseled was provided to the patient regarding diet modification. We will have him follow up in our clinic for further management.     Discharge Vitals:  BP 135/89  Pulse 103  Temp(Src) 97.8 F (36.6 C) (Oral)  Resp 18  Ht 5\' 11"  (1.803 m)  Wt 282 lb 9.6 oz (128.187 kg)  BMI 39.41 kg/m2  SpO2 100%  Discharge Labs:   ABG    Component Value Date/Time   PHART 7.384 12/31/2011 1110   PCO2ART 40.2 12/31/2011 1110   PO2ART 345.0* 12/31/2011 1110   HCO3 23.5 12/31/2011 1110   TCO2 24.7 12/31/2011 1110   ACIDBASEDEF 0.9 12/31/2011 1110   O2SAT 99.9 12/31/2011 1110    Lab Results  Component Value Date   CREATININE 1.08 12/31/2011   BUN 12 12/31/2011   NA 138 12/31/2011     K 4.1 12/31/2011   CL 100 12/31/2011   CO2 29 12/31/2011   Lab Results  Component Value Date   WBC 13.1* 12/30/2011   HGB 14.4 12/30/2011   HCT 41.4 12/30/2011   MCV 79.3 12/30/2011   PLT 330 12/30/2011     Time spent in discharge (includes decision making & examination of pt): greater than 35 minutes   Signed: Tomiko Schoon 01/04/2012, 5:39 PM

## 2012-01-05 ENCOUNTER — Encounter: Payer: Self-pay | Admitting: Internal Medicine

## 2012-01-05 ENCOUNTER — Ambulatory Visit (INDEPENDENT_AMBULATORY_CARE_PROVIDER_SITE_OTHER): Payer: Self-pay | Admitting: Internal Medicine

## 2012-01-05 VITALS — BP 132/83 | HR 84 | Temp 97.7°F | Ht 71.0 in | Wt 292.3 lb

## 2012-01-05 DIAGNOSIS — B370 Candidal stomatitis: Secondary | ICD-10-CM

## 2012-01-05 DIAGNOSIS — R0902 Hypoxemia: Secondary | ICD-10-CM

## 2012-01-05 DIAGNOSIS — E119 Type 2 diabetes mellitus without complications: Secondary | ICD-10-CM

## 2012-01-05 DIAGNOSIS — Z6838 Body mass index (BMI) 38.0-38.9, adult: Secondary | ICD-10-CM

## 2012-01-05 DIAGNOSIS — E7800 Pure hypercholesterolemia, unspecified: Secondary | ICD-10-CM

## 2012-01-05 LAB — LIPID PANEL
Cholesterol: 203 mg/dL — ABNORMAL HIGH (ref 0–200)
HDL: 33 mg/dL — ABNORMAL LOW (ref >39)

## 2012-01-05 LAB — GLUCOSE, CAPILLARY: Glucose-Capillary: 215 mg/dL — ABNORMAL HIGH (ref 70–99)

## 2012-01-05 MED ORDER — NYSTATIN 100000 UNIT/ML MT SUSP: 500000.0000 [IU] | Freq: Four times a day (QID) | OROMUCOSAL | Status: AC

## 2012-01-05 NOTE — Assessment & Plan Note (Addendum)
The patient continues to remain asymptomatic regarding his hypoxemia and likely shunt. I have placed a referral to the patient's cardiologist Dr.Nahser for a bubble echocardiogram and follow-up of his  likely shunt

## 2012-01-05 NOTE — Assessment & Plan Note (Addendum)
[name] has done well with his new diagnosis of diabetes overall. He is still trying to adjust to the lifestyle changes that will be necessary. He has made good progress by checking his blood sugar at least twice daily and giving himself his insulin twice daily as prescribed.   Unfortunately we do not have his meter or his logbook to determine what his blood glucoses have been. His initial A1c of 13.4 indicates his pre-diagnosis average blood sugarwas in the high 300s.He does not report any symptoms consistent with hypoglycemia now that he has started insulin and the symptoms were reviewed at today's visit.  As I do not know what values his blood glucose has ranged other than his report of them being in the 200, I will not change insulin therapy at this time. I will have the patient follow-up within the week with Norm Parcel to download his meter and to have him be more educated regarding his new diagnosis. instructed the patient to continue to try to educate himself regarding diet management.Will get lipid panel today to determine if he requires a statin.we also got a urine microalbumin. We will have him come back in one month to see his new primary care provider. I asked him to always bring his medicines and his glucometer to every clinic appointment from now on.

## 2012-01-05 NOTE — Progress Notes (Signed)
Subjective:     Patient ID: Roy Jennings, male   DOB: 14-Mar-1981, 31 y.o.   MRN: [id number]   Subjective:   Patient ID: Roy Jennings male   DOB: 03/24/1981 31 y.o.   MRN: [id number]  HPI: [name] is a 31 y.o. man with new diagnosis of diabetes mellitus type 2.  He was recently admitted for DKA and hyperglycemia.  The patient is having difficulty adjusting to the new diagnosis of diabetes. His wife has been trying to help him with his management and diet, but he is very frustrated and feeling singled outdo to his diagnosis.  He feels as though everyone is telling him what he can and cannot eat and it is very frustrating.  He has many questions regarding diet for diabetes. We extensively talked about what foods will be more likely to increase his blood sugar. We also talked about some of the long-term complications of diabetes and the importance of good glucose management. We also discussed how this will be a process that he will have to live with and we want to make his lifestyle changes as sustainable as possible so he will be able to maintain them. A totalitarian approach will likely not work. [name] got his insulin on the day of discharge Thursday night. He was able to inject himself with insulin at dinner. He reports no problems injecting himself with insulin. He states that it is difficult to get use to the thought of injections.  They have been keeping a diary of glucose readings, but left it at home with their meter   Diabetes He presents for his initial diabetic visit. He has type 2 diabetes mellitus. Hypoglycemia symptoms include mood changes and nervousness/anxiousness. Pertinent negatives for hypoglycemia include no confusion, dizziness, headaches, hunger, pallor, seizures, sleepiness, speech difficulty, sweats or tremors. Associated symptoms include blurred vision and fatigue. Pertinent negatives for diabetes include no chest pain, no foot paresthesias, no foot ulcerations, no  polydipsia, no polyphagia, no polyuria, no visual change, no weakness and no weight loss. (Blurry vision started started since Thursday. ) Pertinent negatives for hypoglycemia complications include no blackouts. Symptoms are improving. Pertinent negatives for diabetic complications include no impotence, nephropathy or peripheral neuropathy. Risk factors for coronary artery disease include diabetes mellitus, male sex and obesity. Current diabetic treatment includes insulin injections and diet. He is compliant with treatment all of the time. He is currently taking insulin pre-breakfast and pre-dinner. Insulin injections are given by patient. He is following a diabetic and high fat/cholesterol diet. Meal planning includes avoidance of concentrated sweets. He has not had a previous visit with a dietician. He never participates in exercise. His home blood glucose trend is decreasing steadily. His breakfast blood glucose range is generally >200 mg/dl. His lunch blood glucose range is generally >200 mg/dl. His dinner blood glucose range is generally >200 mg/dl. Eye exam is not current.   Thrush - resolving with nystatin. Throat feels much improved today. Still has some tenderness with swallowing. Needs refill on nystatin.  Hypoxemia - denies any shortness of breath or trouble breathing. Remains asymptomatic. Does not have any activity limitations.  Review of Systems  Constitutional: Positive for fatigue. Negative for weight loss.  Eyes: Positive for blurred vision.  Cardiovascular: Negative for chest pain.  Genitourinary: Negative for polyuria and impotence.  Skin: Negative for pallor.  Neurological: Negative for dizziness, tremors, seizures, speech difficulty, weakness and headaches.  Hematological: Negative for polydipsia and polyphagia.  Psychiatric/Behavioral: Negative for confusion. The patient is nervous/anxious.  Past Medical History  Diagnosis Date  . Dizziness   . DKA (diabetic ketoacidoses)  12/30/11    new dx DM  . Diabetes mellitus 12/30/11    dx'd today  . 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 Right bundle branch block     Current outpatient prescriptions:aspirin EC 81 MG tablet, Take 81 mg by mouth daily., Disp: , Rfl: ;  insulin aspart protamine-insulin aspart (NOVOLOG 70/30) (70-30) 100 UNIT/ML injection, Any generic formulation of insulin 70/30 is acceptable., Disp: 30 mL, Rfl: 3 nystatin (MYCOSTATIN) 100000 UNIT/ML suspension, Take 5 mLs (500,000 Units total) by mouth 4 (four) times daily. Take until your throat feels 100% better, then for 2 additional days (48 hours) after that., Disp: 60 mL, Rfl: 0  Family History  Problem Relation Age of Onset  . Diabetes Father   . Diabetes Maternal Grandmother    History   Social History  . Marital Status: Married    Spouse Name: N/A    Number of Children: 1  . Years of Education: BS   Occupational History  .      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 Smoking status: Never Smoker   . Smokeless tobacco: Never Used  . Alcohol Use: Yes     Wine/beer every 1-2 weeks  . Drug Use: No  . Sexually Active: Yes   Other Topics Concern  . None   Social History Narrative   Pt lives in [place] with wife.  1 son is healthy age 55.     Objective:  Physical Exam: Filed Vitals:   01/05/12 0820  BP: 132/83  Pulse: 84  Temp: 97.7 F (36.5 C)  TempSrc: Oral  Height: 5\' 11"  (1.803 m)  Weight: 292 lb 4.8 oz (132.586 kg)          Physical Exam  Constitutional: He is oriented to person, place, and time. He appears well-developed and well-nourished. No distress.  HENT:  Mouth/Throat: Oropharynx is clear and moist and mucous membranes are normal. No oropharyngeal exudate, posterior oropharyngeal edema, posterior oropharyngeal erythema or tonsillar abscesses.       No white plaques Plaques observed  Neck: Neck supple. No JVD present.   Cardiovascular: Normal rate, regular rhythm and normal heart sounds.  Exam reveals no gallop and no friction rub.   No murmur heard. Pulmonary/Chest: Effort normal and breath sounds normal. No respiratory distress. He has no wheezes. He has no rales. He exhibits no tenderness.  Abdominal: Soft. Bowel sounds are normal. There is no tenderness.  Musculoskeletal: He exhibits no edema.  Lymphadenopathy:    He has no cervical adenopathy.  Neurological: He is alert and oriented to person, place, and time.  Skin: Skin is warm and dry. No rash noted. He is not diaphoretic. No erythema. No pallor.  Psychiatric: He has a normal mood and affect. His behavior is normal. Judgment and thought content normal.   Lab Results  Component Value Date   HGBA1C 13.4* 12/30/2011   Lab Results  Component Value Date   CREATININE 1.08 12/31/2011   BUN 12 12/31/2011   NA 138 12/31/2011   K 4.1 12/31/2011   CL 100 12/31/2011   CO2 29 12/31/2011       Assessment:     Please see problem oriented charting for assessment and plan by problem (best viewed under encounters tab).

## 2012-01-05 NOTE — Patient Instructions (Addendum)
Continue your excellent diabetes management. Continue taking nystatin until 2 days after her throat feels 100% improved.  If any of your lab results are abnormal we will contact you by phone or send you a letter. If they are normal, we will not contact you, but will be happy to discuss them at your next clinic appointment.  Return to clinic to see Her new doctor in one month.  Please schedule an appointment with [name] asked to check out Please bring all your medications and your glucometerto your next clinic appointment.     Body Mass Index (BMI) This BMI is not intended for use with those under 12 years of age, or pregnant women, or lactating women. To estimate BMI, locate your height, then find your weight in this listing. Your BMI is located to the right of your weight. Height: 71 inches  Weight: 136 lb = BMI 19 (normal)   Weight: 143 lb = BMI 20 (normal)   Weight: 150 lb = BMI 21 (normal)   Weight: 157 lb = BMI 22 (normal)   Weight: 165 lb = BMI 23 (normal)   Weight: 172 lb = BMI 24 (normal)   Weight: 179 lb = BMI 25 (overweight)   Weight: 186 lb = BMI 26 (overweight)   Weight: 193 lb = BMI 27 (overweight)   Weight: 200 lb = BMI 28 (overweight)   Weight: 208 lb = BMI 29 (overweight)   Weight: 215 lb = BMI 30 (obese)   Weight: 222 lb = BMI 31 (obese)   Weight: 229 lb = BMI 32 (obese)   Weight: 236 lb = BMI 33 (obese)   Weight: 243 lb = BMI 34 (obese)   Weight: 250 lb = BMI 35 (obese)   Weight: 257 lb = BMI 36 (obese)   Weight: 265 lb = BMI 37 (obese)   Weight: 272 lb = BMI 38 (obese)   Weight: 279 lb = BMI 39 (obese)   Weight: 286 lb = BMI 40 (extreme obesity)   Weight: 293 lb = BMI 41 (extreme obesity)   HEALTH RISK CLASSIFICATION ACCORDING TO BODY MASS INDEX (BMI) Classification: Underweight.  BMI Category:  less than 18.5   Risk of developing health problems: Increased.  Classification: Normal Weight.  BMI Category: 18.5 to 24.9    Risk of developing health problems: Least.  Classification: Overweight.  BMI Category: 25.0 to 29.9   Risk of developing health problems: Increased.  Classification: Obese class.  BMI Category:  30.0 to 34.9   Risk of developing health problems: High.  Classification: Obese class II.  BMI Category:  35.0 to 39.9   Risk of developing health problems: Very high.  Classification: Obese class III.  BMI Category: 40.0 or more   Risk of developing health problems: Extremely high.  Note: For persons 34 years and older the 'normal' range may begin slightly above BMI 18.5 and extend into the 'overweight' range.   To clarify risk for each individual, other factors also need to be considered, such as:   Lifestyle habits.   Fitness level.   Presence or absence of other health risk conditions.   The classification system may underestimate or overestimate health risks in certain adults, such as:   Highly muscular adults. Very muscular adults, such as athletes, may have a low percentage of body fat but a large amount of muscle tissue. This can result in a BMI in the overweight range that may over estimate the risk of developing health problems.  Adults who naturally have a very lean body build.   Young adults who have not reached full growth.   Adults over 65 years of age. For adults over age 24, more research is needed to determine if the cut-off points for the 'normal weight' range differ in any way from those for younger adults.   It is also important to note that BMI is only one part of a health risk assessment. To further clarify risk, other factors need to be considered as well.   Age, inherited traits, presence or absence of other conditions such as diabetes, high blood lipids, hypertension, and high blood glucose levels also influence the development of diseases associated with overweight. Risk factors such as poor eating habits, physical inactivity, and tobacco use can play  a role in the development of diseases associated with both overweight and underweight.  Consult a caregiver for a more complete assessment of your weight as it relates to health risk. It is important to discuss with your caregiver what BMI means for you as an individual. Maintaining a 'normal weight' is one element of good health. However, unhealthy eating habits, low levels of physical activity and tobacco use will increase the risk of health problems even for those within the normal weight range.  Being overweight indicates some risk to health. But research suggests that regular physical activity can decrease the risk of several health problems. Equally, a nutritious diet has been shown to decrease some of the risks associated with overweight. It is important to emphasize that a weight classification system is but one tool to assess health risks in individuals.  Document Released: 03/31/2004 Document Revised: 07/09/2011 Document Reviewed: 04/29/2005 [facility] Patient Information 2012 [place], [place].

## 2012-01-05 NOTE — Assessment & Plan Note (Signed)
LDL is above goal of <100. As he is only mildly elevated at 119, we can begin with lifestyle changes at this time. If LDL becomes greater than 130, or if he is unable to make meaningful headway towards lowering LDL, we will start a statin

## 2012-01-05 NOTE — Assessment & Plan Note (Signed)
Patient has had improvement in his dysphagia with nystatin swish and swallow. I can detect no plaques on exam. However he is almost out of this medication. I will provide a new prescription today. He is to continue taking nystatin 4 times a day until 2 days after his symptoms are entirely resolved.

## 2012-01-06 LAB — MICROALBUMIN / CREATININE URINE RATIO
Creatinine, Urine: 205.9 mg/dL
Microalb Creat Ratio: 9.5 mg/g (ref 0.0–30.0)

## 2012-01-07 ENCOUNTER — Ambulatory Visit (INDEPENDENT_AMBULATORY_CARE_PROVIDER_SITE_OTHER): Payer: Self-pay | Admitting: Dietician

## 2012-01-07 DIAGNOSIS — E119 Type 2 diabetes mellitus without complications: Secondary | ICD-10-CM

## 2012-01-07 NOTE — Progress Notes (Signed)
Utilization Review Completed.Betina Puckett T6/01/2012   

## 2012-01-07 NOTE — Progress Notes (Signed)
Diabetes Self-Management Training (DSMT)  Initial Visit  01/07/2012 Mr. Kastin Cerda, identified by name and date of birth, is a 31 y.o. male with Type 2 Diabetes. Year of diabetes diagnosis: 12/2011 Other persons present: spouse/SO  ASSESSMENT Patient concerns are Nutrition/meal planning, Healthy Lifestyle, Glycemic control and Weight control.  There were no vitals taken for this visit. There is no height or weight on file to calculate BMI. Lab Results  Component Value Date   LDLCALC 119* 01/05/2012   Lab Results  Component Value Date   HGBA1C 13.4* 12/30/2011   Labs reviewed. Family history of diabetes: No Support systems: spouse Special needs: None Patients belief/attitude about diabetes: Diabetes can be controlled. Self foot exams daily: No Diabetes Complications: None Prior DM Education: Yes   Medications See Medications list.  Is interested in learning more   Exercise Plan Doing sendentary work and walking for about 30 minutes a day.   Self-Monitoring Medication Nutrition Monitor: [employer] meter Frequency of testing: 1-2 times/day Breakfast: 167 yesterday am Lunch: ~ 2 hours after small lunch today in office: 117 Supper: 200-225 reported  Hyperglycemia: Yes Weekly Hypoglycemia: No   Meal Planning Limited knowledge and Interested in improving   Assessment comments: patient and wife very interested in obtaining diabetes knowledge today. Encouraged them to work together as a team to role model healthy behaviors for their 31 year old.      INDIVIDUAL DIABETES EDUCATION PLAN:  Nutrition management Medication Monitoring Acute complication: Chronic complications Psychosocial adjustment _______________________________________________________________________  Intervention TOPICS COVERED TODAY:  Nutrition management  Role of diet in the treatment of diabetes and the relationship between the three main macronutritents and blood glucose control. Reviewed blood  glucose goals for pre and post meals and how to evaluate the patients' food intake on their blood glucose level. Meal timing in regards to the patients' current diabetes medication. Medication  Reviewed patients medication for diabetes, action, purpose, timing of dose and side effects. Monitoring  Purpose and frequency of SMBG. Goal setting  Helped patient develop diabetes management plan for weight loss  PATIENTS GOALS/PLAN (copy and paste in patient instructions so patient receives a copy): 1.  Learning Objective:       Understand what makes blood sugar go up and down 2.  Behavioral Objective:         Monitoring: To identify blood glucose trends, I will bring meter to all visits Never 0%  Personalized Follow-Up Plan for Ongoing Self Management Support:  Doctor's Office, family and CDE visits ______________________________________________________________________   Outcomes Expected outcomes: Demonstrated interest in learning.Expect positive changes in lifestyle. Self-care Barriers: Lack of material resources Education material provided: yes- how to be a good support person to a loved one with diabetes Patient to contact team via Phone if problems or questions. Time in: 1530     Time out: 1630 Future DSMT - 4-6 wks   Camden Knotek, Lupita Leash

## 2012-01-11 ENCOUNTER — Telehealth: Payer: Self-pay | Admitting: Licensed Clinical Social Worker

## 2012-01-11 NOTE — Telephone Encounter (Signed)
Mr. Girton was referred to CSW for financial resources, as pt does not have insurance.  CSW placed call to Mr. Aguado, pt not at home but discussed available resources with spouse.  Spouse states they have yet to meet with Financial counselor due to counselor out of office.  CSW informed spouse, counselor is in office today and available for walk-in during hours, contact information given.  CSW provided information on MAP, contact info and walk-in hours.  Discussed benefits of both programs, depending on eligibility.

## 2012-02-08 ENCOUNTER — Ambulatory Visit (INDEPENDENT_AMBULATORY_CARE_PROVIDER_SITE_OTHER): Payer: Self-pay | Admitting: Internal Medicine

## 2012-02-08 ENCOUNTER — Encounter: Payer: Self-pay | Admitting: Internal Medicine

## 2012-02-08 VITALS — BP 138/89 | HR 103 | Temp 97.9°F | Wt 295.9 lb

## 2012-02-08 DIAGNOSIS — E119 Type 2 diabetes mellitus without complications: Secondary | ICD-10-CM

## 2012-02-08 DIAGNOSIS — Z79899 Other long term (current) drug therapy: Secondary | ICD-10-CM

## 2012-02-08 DIAGNOSIS — L304 Erythema intertrigo: Secondary | ICD-10-CM

## 2012-02-08 DIAGNOSIS — L538 Other specified erythematous conditions: Secondary | ICD-10-CM

## 2012-02-08 LAB — GLUCOSE, CAPILLARY: Glucose-Capillary: 86 mg/dL (ref 70–99)

## 2012-02-08 MED ORDER — NYSTATIN 100000 UNIT/GM EX POWD: CUTANEOUS | Status: DC

## 2012-02-08 NOTE — Patient Instructions (Addendum)
--  Please use the prescribed powder and follow up with [place] in 1 month or sooner if your symptoms do not improve.  --Follow up with your eye doctor

## 2012-02-09 ENCOUNTER — Telehealth: Payer: Self-pay | Admitting: *Deleted

## 2012-02-09 NOTE — Telephone Encounter (Signed)
[facility] calls to say they do not have nyastatin powder, they do have cream, could you please change order to cream? Thanks, please change on med list and i will be glad to call in for you.

## 2012-02-10 DIAGNOSIS — L304 Erythema intertrigo: Secondary | ICD-10-CM | POA: Insufficient documentation

## 2012-02-10 MED ORDER — NYSTATIN 100000 UNIT/GM EX CREA: TOPICAL_CREAM | Freq: Two times a day (BID) | CUTANEOUS | Status: DC

## 2012-02-10 NOTE — Assessment & Plan Note (Addendum)
Shortly after his DM2 diagnosis, the pt was hesitant to follow his therapy but so far has been compliant, meeting with Lupita Leash, making changes to his diet, and being consistent with checking his blood glucose and injecting his insulin. His HbA1C of 10.2 today from 13.4 on 5/29 are consistent with his adherence to his insulin regimen and lifestyle changes. As his HbA1C continues to trend down, and with ongoing lifestyle changes which may also lead to weight loss, he might be able to achieve good glycemic control on oral medicines alone. --Continue on Novolog 70/30, 50 units BID --Continue lifestyle changes --Opthalmology referral given. Pt with orange card --Follow up in 1 month

## 2012-02-10 NOTE — Assessment & Plan Note (Signed)
Pt reports having an occasionally pruritic rash in the bilateral inner thighs for a a few weeks now. Rash is consistent with appearance of intertigo.  --Prescribed topical Nystatin powder to be applied to the area for 2 weeks.  --Pt's pharmacy does not carry Nystatin powder, changed prescription to Nystatin cream.

## 2012-02-10 NOTE — Progress Notes (Signed)
  Subjective:    Patient ID: Roy Jennings, male    DOB: [birth year], 31 y.o.   MRN: [id number]  HPI Roy Jennings is a 31 yo with recently diagnosed DM2 which led to a hospitalization with DKA who comes in today for a diabetes follow up visit. He is currently on Novolog 70/30, 50 units BID. His blood glucose today is 86 and his HbA1C is 10.2 (was 13.4 on 5/29). He is checking his blood glucose level 2-3 times per day. He denies having blood sugar levels below 70 and denies experiencing diaphoresis, tremors, irritability, or other symptoms of hypoglycemia. He has met with the [facility] diabetes educator, Lupita Leash, and has make changes to his diet to exclude sodas and has decreased juice and fast foods consumption. He is very interested in eventually transitioning to oral medication for his diabetes management and is also willing to continue working on lifestyle changes as part of his therapy.  At this visit he refuses the recommended vaccinations primarily because of his fear/anxiety of "big" needles.  He has no complaints of visual changes today but he understands that he needs a diabetic eye exam. Currently he has no active dental issues or complaints but would like a referral for basic dental services.    He tells me that the oral thrush has completely sudsided but he has had a rash along his inner thigh that is occasionally pruritic for a few weeks now.   He has not other complaints today  Review of Systems  Constitutional: Negative for fever, diaphoresis, activity change, appetite change, fatigue and unexpected weight change.  Respiratory: Negative for cough and shortness of breath.   Cardiovascular: Negative for chest pain and leg swelling.  Genitourinary: Negative for hematuria, discharge, penile swelling, genital sores and penile pain.  Neurological: Negative for dizziness, tremors and light-headedness.  Psychiatric/Behavioral: Negative for behavioral problems and agitation.       Objective:   Physical Exam  Constitutional: He is oriented to person, place, and time. He appears well-developed and well-nourished. No distress.       obese  HENT:  Head: Normocephalic and atraumatic.  Right Ear: External ear normal.  Left Ear: External ear normal.  Nose: Nose normal.  Mouth/Throat: Oropharynx is clear and moist. No oropharyngeal exudate.  Eyes: Conjunctivae are normal. Right eye exhibits no discharge. Left eye exhibits no discharge. No scleral icterus.  Neck: Neck supple.  Cardiovascular: Normal rate and normal heart sounds.   No murmur heard. Pulmonary/Chest: Effort normal and breath sounds normal. No respiratory distress. He has no wheezes. He has no rales. He exhibits no tenderness.  Genitourinary:       Diffuse, white, scaly rash approximately 5 inches along the inner thighs bilaterally immediately medial but without scrotal or penile involvement  Musculoskeletal: He exhibits no edema and no tenderness.  Lymphadenopathy:    He has no cervical adenopathy.  Neurological: He is alert and oriented to person, place, and time. No cranial nerve deficit.  Skin: Skin is warm and dry. Rash noted. He is not diaphoretic. No erythema.       Acanthosis nigricans of neck. Diffuse, white, scaly rash approximately 5 inches along the inner thighs bilaterally immediately medial but without scrotal or penile involvement  Psychiatric: He has a normal mood and affect. His behavior is normal.          Assessment & Plan:

## 2012-02-11 ENCOUNTER — Encounter: Payer: Self-pay | Admitting: Dietician

## 2012-02-11 NOTE — Progress Notes (Signed)
I saw patient and discussed his care with resident Dr. Kennerly.  I agree with the clinical findings and plans as outlined in her note. 

## 2012-02-16 ENCOUNTER — Telehealth: Payer: Self-pay | Admitting: Dietician

## 2012-02-16 NOTE — Telephone Encounter (Signed)
Patient agreed to schedule for next week.

## 2012-02-24 ENCOUNTER — Ambulatory Visit: Payer: Self-pay | Admitting: Dietician

## 2012-02-25 ENCOUNTER — Other Ambulatory Visit: Payer: Self-pay | Admitting: Internal Medicine

## 2012-02-25 NOTE — Progress Notes (Signed)
Opened encounter in error  

## 2012-03-02 ENCOUNTER — Ambulatory Visit (INDEPENDENT_AMBULATORY_CARE_PROVIDER_SITE_OTHER): Payer: Self-pay | Admitting: Cardiovascular Disease

## 2012-03-02 ENCOUNTER — Telehealth: Payer: Self-pay | Admitting: Cardiovascular Disease

## 2012-03-02 ENCOUNTER — Encounter: Payer: Self-pay | Admitting: Cardiovascular Disease

## 2012-03-02 VITALS — BP 132/84 | HR 94 | Ht 72.0 in | Wt 299.0 lb

## 2012-03-02 DIAGNOSIS — E119 Type 2 diabetes mellitus without complications: Secondary | ICD-10-CM

## 2012-03-02 DIAGNOSIS — I4949 Other premature depolarization: Secondary | ICD-10-CM

## 2012-03-02 DIAGNOSIS — E7800 Pure hypercholesterolemia, unspecified: Secondary | ICD-10-CM

## 2012-03-02 DIAGNOSIS — I493 Ventricular premature depolarization: Secondary | ICD-10-CM

## 2012-03-02 NOTE — Telephone Encounter (Signed)
New msg Pt's wife called and wanted to talk to you about his appt today

## 2012-03-02 NOTE — Assessment & Plan Note (Signed)
Benign  NO need for beta blocker at this time. Consdier f/U MRI if symptoms recur

## 2012-03-02 NOTE — Patient Instructions (Signed)
Your physician wants you to follow-up in: year  WITH DR Haywood Filler will receive a reminder letter in the mail two months in advance. If you don't receive a letter, please call our office to schedule the follow-up appointment. Your physician recommends that you continue on your current medications as directed. Please refer to the Current Medication list given to you today.

## 2012-03-02 NOTE — Progress Notes (Signed)
Patient ID: Roy Jennings, male   DOB: 1981/07/01, 31 y.o.   MRN: [id number] 31 yo referred from [facility] ER for dizzyness and PVC's. Seen in ER 2012  Had URI felt feverish for a few days prior. Labs ok. Had one 5 hr energy drink. Did not note any palpitations. Not postural and ECG otherwise ok without long QT. No family history of sudden death, syncope. ? Grandparent with CAD and CHF. No previous cardiac disease. No primary care doctor. Obese and sedentary. Discussed eating habits, portion size, not skipping meals and low carb diet. Feels ok now with no palpitations, SSCP, dizzyness. No regular meds, drugs.   W/U last year with normal stress echo.  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  RV looked ok on echo and MRI not done.  Still heavy Discussed low carb diet and exercise.  No palpitations or dizzyness now  ROS: Denies fever, malais, weight loss, blurry vision, decreased visual acuity, cough, sputum, SOB, hemoptysis, pleuritic pain, palpitaitons, heartburn, abdominal pain, melena, lower extremity edema, claudication, or rash.  All other systems reviewed and negative  General: Affect appropriate Healthy:  appears stated age HEENT: normal Neck supple with no adenopathy JVP normal no bruits no thyromegaly Lungs clear with no wheezing and good diaphragmatic motion Heart:  S1/S2 no murmur, no rub, gallop or click PMI normal Abdomen: benighn, BS positve, no tenderness, no AAA no bruit.  No HSM or HJR Distal pulses intact with no bruits No edema Neuro non-focal Skin warm and dry No muscular weakness   Current Outpatient Prescriptions  Medication Sig Dispense Refill  . aspirin EC 81 MG tablet Take 81 mg by mouth daily.      . insulin aspart protamine-insulin aspart (NOVOLOG 70/30) (70-30) 100 UNIT/ML injection Any generic formulation of insulin 70/30 is acceptable.  30 mL  3  . nystatin cream (MYCOSTATIN) Apply topically 2 (two) times daily. Over  affected area for two weeks  30 g  1    Allergies  Review of patient's allergies indicates no known allergies.  Electrocardiogram:  Assessment and Plan

## 2012-03-02 NOTE — Assessment & Plan Note (Signed)
Discussed low carb diet.  Target hemoglobin A1c is 6.5 or less.  Continue current medications.  

## 2012-03-08 NOTE — Telephone Encounter (Signed)
LEFT MESSAGE  YESTERDAY  TO CALL BACK  ATTEMPTED TO CALL AGAIN TODAY  WITH NO ANSWER  WENT TO VOICE MAIL AWAITING RETURN CALL ./CY

## 2012-03-08 NOTE — Addendum Note (Signed)
Addended by: Neomia Dear on: 03/08/2012 05:06 PM   Modules accepted: Orders

## 2012-03-28 ENCOUNTER — Encounter: Payer: Self-pay | Admitting: *Deleted

## 2012-03-28 ENCOUNTER — Encounter: Payer: Self-pay | Admitting: Internal Medicine

## 2012-03-28 ENCOUNTER — Ambulatory Visit (INDEPENDENT_AMBULATORY_CARE_PROVIDER_SITE_OTHER): Payer: Self-pay | Admitting: Internal Medicine

## 2012-03-28 VITALS — BP 133/90 | HR 92 | Temp 97.3°F | Ht 71.0 in | Wt 301.7 lb

## 2012-03-28 DIAGNOSIS — H60399 Other infective otitis externa, unspecified ear: Secondary | ICD-10-CM

## 2012-03-28 DIAGNOSIS — H6090 Unspecified otitis externa, unspecified ear: Secondary | ICD-10-CM

## 2012-03-28 DIAGNOSIS — E785 Hyperlipidemia, unspecified: Secondary | ICD-10-CM

## 2012-03-28 DIAGNOSIS — E119 Type 2 diabetes mellitus without complications: Secondary | ICD-10-CM

## 2012-03-28 MED ORDER — METFORMIN HCL 500 MG PO TABS: 500.0000 mg | ORAL_TABLET | Freq: Two times a day (BID) | ORAL | Status: DC

## 2012-03-28 MED ORDER — "INSULIN SYRINGE 31G X 5/16"" 1 ML MISC": 1.0000 | Freq: Three times a day (TID) | Status: DC

## 2012-03-28 MED ORDER — OFLOXACIN 0.3 % OT SOLN: 10.0000 [drp] | Freq: Every day | OTIC | Status: AC

## 2012-03-28 MED ORDER — INSULIN ASPART PROT & ASPART (70-30 MIX) 100 UNIT/ML ~~LOC~~ SUSP: 50.0000 [IU] | Freq: Two times a day (BID) | SUBCUTANEOUS | Status: DC

## 2012-03-28 NOTE — Assessment & Plan Note (Signed)
Newly diagnosed DM on insulin. Reports that his BG ranging at 120-140's. Only check his CBG once daily.   - add Metformin 500 mg po BID - instruct him to check his BG TID and call the clinic with CBG < 70 or s/s hypoglycemia.  - written education on hypoglycemia discussed and given to patient - follow up in 2 weeks for evaluation of his metformin and recheck his BMP. May need Max Metformin to 1000 mg po BID if he tolerates ok. May deescalate his Insulin regimen based on his CBG monitoring.

## 2012-03-28 NOTE — Progress Notes (Signed)
Subjective:   Patient ID: Roy Jennings male   DOB: 1980/10/16 31 y.o.   MRN: [id number]  HPI: [name] is a 31 y.o. man with new diagnosis of diabetes mellitus type 2 with recent admission for DKA and hyperglycemia in May, 2013, who presents to the clinic for right ear pain. Patient states that he started to have right ear pain with whitish ear discharge 3 days ago. He also feels hot and cold but did not check his temp. denies ear itching or hearing loss. Denies sore throat, cold like symptoms,  chest pain, chest pressure, palpitation, nausea, vomiting, abdominal pain. He used OTC ibuprofen for his ear pain without much relief. He is here for evaluation. Of note, he is a Therapist, music. He does not use any ear instrumentation.   With regards to his DM control, he uses 70/30 50 units BID.    Health maintenance  1. Opthalmology exam done last week. 2. Refused Pneumo vaccine    Past Medical History  Diagnosis Date  . Dizziness   . DKA (diabetic ketoacidoses) 12/30/11    new dx DM  . Diabetes mellitus 12/30/11    dx'd today  . 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 Right bundle branch block    Current Outpatient Prescriptions  Medication Sig Dispense Refill  . aspirin EC 81 MG tablet Take 81 mg by mouth daily.      . insulin aspart protamine-insulin aspart (NOVOLOG 70/30) (70-30) 100 UNIT/ML injection Any generic formulation of insulin 70/30 is acceptable.  30 mL  3  . nystatin cream (MYCOSTATIN) Apply topically 2 (two) times daily. Over affected area for two weeks  30 g  1   Family History  Problem Relation Age of Onset  . Diabetes Father   . Diabetes Maternal Grandmother    History   Social History  . Marital Status: Married    Spouse Name: N/A    Number of Children: 1  . Years of Education: BS   Occupational History  .      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 Smoking  status: Never Smoker   . Smokeless tobacco: Never Used  . Alcohol Use: Yes     Wine/beer every 1-2 weeks  . Drug Use: No  . Sexually Active: Yes   Other Topics Concern  . None   Social History Narrative   Pt lives in [place] with wife.  1 son is healthy age 53.   Review of Systems: See HPI Objective:  Physical Exam: Filed Vitals:   03/28/12 0858  BP: 133/90  Pulse: 92  Temp: 97.3 F (36.3 C)  TempSrc: Oral  Height: 5\' 11"  (1.803 m)  Weight: 301 lb 11.2 oz (136.85 kg)   General: alert, well-developed, and cooperative to examination.  Head: normocephalic and atraumatic.  Ear: No erythema or trauma noted on auricle and Tragus. Mild tenderness with tragal pressure or when the auricle is manipulated or pulled. No edema, erythema or frank necrosis noted on ear canal. White and grey Debris/cerumen noted The tympanic membrane intact. no presence of an air-fluid level along the tympanic membrane.  Eyes: vision grossly intact, pupils equal, pupils round, pupils reactive to light, no injection and anicteric.  Mouth: pharynx pink and moist, no erythema, and no exudates.  Neck: supple, full ROM, no thyromegaly, no JVD, and no carotid bruits.  Lungs: normal respiratory effort, no accessory muscle  use, normal breath sounds, no crackles, and no wheezes. Heart: normal rate, regular rhythm, no murmur, no gallop, and no rub.  Abdomen: soft, non-tender, normal bowel sounds, no distention, no guarding, no rebound tenderness, no hepatomegaly, and no splenomegaly.  Msk: no joint swelling, no joint warmth, and no redness over joints.  Pulses: 2+ DP/PT pulses bilaterally Extremities: No cyanosis, clubbing, edema Neurologic: alert & oriented X3, cranial nerves II-XII intact, strength normal in all extremities, sensation intact to light touch, and gait normal.  Skin: turgor normal and no rashes.  Psych: Oriented X3, memory intact for recent and remote, normally interactive, good eye contact, not  anxious appearing, and not depressed appearing.   Assessment & Plan:

## 2012-03-28 NOTE — Patient Instructions (Addendum)
1. Ofloxacin 10 drops to right ear daily x 7 days 2. Do not swimming x 1 week 3. Follow up in 2 weeks 4. Will start you on Metformin 500 mg po BID for your diabetes control. Please check your blood sugar three times daily and report any blood sugar is less than 70 or you experience symptoms of hypoglycemia.    Otitis Externa Otitis externa ("swimmer's ear") is a germ (bacterial) or fungal infection of the outer ear canal (from the eardrum to the outside of the ear). Swimming in dirty water may cause swimmer's ear. It also may be caused by moisture in the ear from water remaining after swimming or bathing. Often the first signs of infection may be itching in the ear canal. This may progress to ear canal swelling, redness, and pus drainage, which may be signs of infection. HOME CARE INSTRUCTIONS   Apply the antibiotic drops to the ear canal as prescribed by your doctor.   This can be a very painful medical condition. A strong pain reliever may be prescribed.   Only take over-the-counter or prescription medicines for pain, discomfort, or fever as directed by your caregiver.   If your caregiver has given you a follow-up appointment, it is very important to keep that appointment. Not keeping the appointment could result in a chronic or permanent injury, pain, hearing loss and disability. If there is any problem keeping the appointment, you must call back to this facility for assistance.  PREVENTION   It is important to keep your ear dry. Use the corner of a towel to wick water out of the ear canal after swimming or bathing.   Avoid scratching in your ear. This can damage the ear canal or remove the protective wax lining the canal and make it easier for germs (bacteria) or a fungus to grow.   You may use ear drops made of rubbing alcohol and vinegar after swimming to prevent future "swimmer's ear" infections. Make up a small bottle of equal parts white vinegar and alcohol. Put 3 or 4 drops into  each ear after swimming.   Avoid swimming in lakes, polluted water, or poorly chlorinated pools.  SEEK MEDICAL CARE IF:   An oral temperature above 102 F (38.9 C) develops.   Your ear is still painful after 3 days and shows signs of getting worse (redness, swelling, pain, or pus).  MAKE SURE YOU:   Understand these instructions.   Will watch your condition.   Will get help right away if you are not doing well or get worse.  Document Released: 07/20/2005 Document Revised: 07/09/2011 Document Reviewed: 02/24/2008 [facility] Patient Information 2012 [place], [place].  Hypoglycemia (Low Blood Sugar) Hypoglycemia is when the glucose (sugar) in your blood is too low. Hypoglycemia can happen for many reasons. It can happen to people with or without diabetes. Hypoglycemia can develop quickly and can be a medical emergency.  CAUSES  Having hypoglycemia does not mean that you will develop diabetes. Different causes include:  Missed or delayed meals or not enough carbohydrates eaten.   Medication overdose. This could be by accident or deliberate. If by accident, your medication may need to be adjusted or changed.   Exercise or increased activity without adjustments in carbohydrates or medications.   A nerve disorder that affects body functions like your heart rate, blood pressure and digestion (autonomic neuropathy).   A condition where the stomach muscles do not function properly (gastroparesis). Therefore, medications may not absorb properly.   The inability  to recognize the signs of hypoglycemia (hypoglycemic unawareness).   Absorption of insulin - may be altered.   Alcohol consumption.   Pregnancy/menstrual cycles/postpartum. This may be due to hormones.   Certain kinds of tumors. This is very rare.  SYMPTOMS   Sweating.   Hunger.   Dizziness.   Blurred vision.   Drowsiness.   Weakness.   Headache.   Rapid heart beat.   Shakiness.   Nervousness.  DIAGNOSIS    Diagnosis is made by monitoring blood glucose in one or all of the following ways:  Fingerstick blood glucose monitoring.   Laboratory results.  TREATMENT  If you think your blood glucose is low:  Check your blood glucose, if possible. If it is less than 70 mg/dl, take one of the following:   3-4 glucose tablets.    cup juice (prefer clear like apple).    cup "regular" soda pop.   1 cup milk.   -1 tube of glucose gel.   5-6 hard candies.   Do not over treat because your blood glucose (sugar) will only go too high.   Wait 15 minutes and recheck your blood glucose. If it is still less than 70 mg/dl (or below your target range), repeat treatment.   Eat a snack if it is more than one hour until your next meal.  Sometimes, your blood glucose may go so low that you are unable to treat yourself. You may need someone to help you. You may even pass out or be unable to swallow. This may require you to get an injection of glucagon, which raises the blood glucose. HOME CARE INSTRUCTIONS  Check blood glucose as recommended by your caregiver.   Take medication as prescribed by your caregiver.   Follow your meal plan. Do not skip meals. Eat on time.   If you are going to drink alcohol, drink it only with meals.   Check your blood glucose before driving.   Check your blood glucose before and after exercise. If you exercise longer or different than usual, be sure to check blood glucose more frequently.   Always carry treatment with you. Glucose tablets are the easiest to carry.   Always wear medical alert jewelry or carry some form of identification that states that you have diabetes. This will alert people that you have diabetes. If you have hypoglycemia, they will have a better idea on what to do.  SEEK MEDICAL CARE IF:   You are having problems keeping your blood sugar at target range.   You are having frequent episodes of hypoglycemia.   You feel you might be having side  effects from your medicines.   You have symptoms of an illness that is not improving after 3-4 days.   You notice a change in vision or a new problem with your vision.  SEEK IMMEDIATE MEDICAL CARE IF:   You are a family member or friend of a person whose blood glucose goes below 70 mg/dl and is accompanied by:   Confusion.   A change in mental status.   The inability to swallow.   Passing out.  Document Released: 07/20/2005 Document Revised: 07/09/2011 Document Reviewed: 03/14/2009 [facility] Patient Information 2012 [place], [place].

## 2012-03-28 NOTE — Assessment & Plan Note (Signed)
The clinical manifestation is consistent with the Dx.  No S/S malignant otitis externa.   - Ofloxacin ear drops 10 drops daily x 7days - avoid swimming x 1 week.

## 2012-03-28 NOTE — Progress Notes (Unsigned)
Pt presents c/o pain in R ear since Friday, states 6-7/10 pain scale, ibuprofen 4 tabs does help but not completely, took last sun at 3-4 pm. Had been swimming a lot recently and thinks he may have swimmer's ear. Desires to be seen. appt dr Dierdre Searles [id number] per [place].

## 2012-03-28 NOTE — Assessment & Plan Note (Signed)
LDL 119. Goal is < 100.   - patient will continue current diet and exercise plan - recheck his Lipid Panel in 3 month. May consider adding statin if his LDL is not at the goal.

## 2012-06-24 ENCOUNTER — Other Ambulatory Visit: Payer: Self-pay | Admitting: *Deleted

## 2012-06-24 ENCOUNTER — Telehealth: Payer: Self-pay | Admitting: *Deleted

## 2012-06-24 DIAGNOSIS — E119 Type 2 diabetes mellitus without complications: Secondary | ICD-10-CM

## 2012-06-24 MED ORDER — METFORMIN HCL 500 MG PO TABS: 500.0000 mg | ORAL_TABLET | Freq: Two times a day (BID) | ORAL | Status: DC

## 2012-06-24 MED ORDER — INSULIN ASPART PROT & ASPART (70-30 MIX) 100 UNIT/ML ~~LOC~~ SUSP: 50.0000 [IU] | Freq: Two times a day (BID) | SUBCUTANEOUS | Status: DC

## 2012-06-24 NOTE — Telephone Encounter (Signed)
Unable to obtain insulin today from [facility], i have spoken to wife, she states they cannot afford insulin, i spoke w/ wife again and she cannot come for insulin, she will take him to [facility]

## 2012-06-24 NOTE — Telephone Encounter (Signed)
Both are the same type of insulin. Patient can use humulin 70/30

## 2012-06-24 NOTE — Telephone Encounter (Signed)
[facility] calls concerning insulin, script transferred from [facility] for humulin 70/30, med list says novolog 70/30, pt has been getting humulin, he is out of insulin, please advise asap

## 2012-06-24 NOTE — Telephone Encounter (Signed)
Pt needs to verify that he cannot use Novolog, and verify pharmacy, he should not wait to run out of his insulin and he needs to address this during his next visit.

## 2012-06-27 NOTE — Telephone Encounter (Signed)
Called to pharm 

## 2012-06-27 NOTE — Telephone Encounter (Signed)
i have called the script to [facility] as pt requested

## 2012-07-06 ENCOUNTER — Encounter: Payer: Self-pay | Admitting: Internal Medicine

## 2012-07-06 ENCOUNTER — Ambulatory Visit (INDEPENDENT_AMBULATORY_CARE_PROVIDER_SITE_OTHER): Payer: Self-pay | Admitting: Internal Medicine

## 2012-07-06 VITALS — BP 141/90 | HR 100 | Temp 98.1°F | Ht 71.0 in | Wt 313.1 lb

## 2012-07-06 DIAGNOSIS — E119 Type 2 diabetes mellitus without complications: Secondary | ICD-10-CM

## 2012-07-06 DIAGNOSIS — Z79899 Other long term (current) drug therapy: Secondary | ICD-10-CM

## 2012-07-06 LAB — BASIC METABOLIC PANEL
Calcium: 10 mg/dL (ref 8.4–10.5)
Chloride: 100 mEq/L (ref 96–112)
Creat: 1.1 mg/dL (ref 0.50–1.35)
Sodium: 136 mEq/L (ref 135–145)

## 2012-07-06 LAB — POCT GLYCOSYLATED HEMOGLOBIN (HGB A1C): Hemoglobin A1C: 6.7

## 2012-07-06 LAB — GLUCOSE, CAPILLARY: Glucose-Capillary: 157 mg/dL — ABNORMAL HIGH (ref 70–99)

## 2012-07-06 NOTE — Patient Instructions (Addendum)
General Instructions: Your diabetes control has improved tremendously. Continue the insulin 50 units twice a day in addition to the Metformin. If you continue to have great control we can try to increase the Metformin and decrease the insulin in the future. This will depend on your kidney function and blood sugar control. We will check it today.   Treatment Goals:  Goals (1 Years of Data) as of 07/06/2012          As of Today 03/28/12 03/02/12 02/08/12 01/05/12     Blood Pressure    . Blood Pressure < 140/90  141/90 133/90 132/84 138/89 132/83    . Blood Pressure < 140/90  141/90 133/90 132/84 138/89 132/83     Result Component    . HEMOGLOBIN A1C < 7.0  6.7   10.2     . HEMOGLOBIN A1C < 7.0  6.7   10.2     . LDL CALC < 100      119    . LDL CALC < 100      119      Progress Toward Treatment Goals:  Treatment Goal 07/06/2012  Hemoglobin A1C at goal    Self Care Goals & Plans:  Self Care Goal 07/06/2012  Eat healthy foods eat more vegetables  Be physically active take a walk every day; take the stairs instead of the elevator    Home Blood Glucose Monitoring 07/06/2012  Check my blood sugar (No Data)  When to check my blood sugar before meals     Care Management & Community Referrals:   None

## 2012-07-06 NOTE — Assessment & Plan Note (Signed)
Hemoglobin A1C  Date Value Range Status  07/06/2012 6.7   Final  02/08/2012 10.2   Final  12/30/2011 13.4* <5.7 % Final     RESULT CALLED TO, READ BACK BY AND VERIFIED WITH:     R.COLUMBRES,RN 2354 12/30/11 M.CAMPBELL     (NOTE)                                                                               According to the ADA Clinical Practice Recommendations for 2011, when     HbA1c is used as a screening test:      >=6.5%   Diagnostic of Diabetes Mellitus               (if abnormal result is confirmed)     5.7-6.4%   Increased risk of developing Diabetes Mellitus     References:Diagnosis and Classification of Diabetes Mellitus,Diabetes     Care,2011,34(Suppl 1):S62-S69 and Standards of Medical Care in             Diabetes - 2011,Diabetes Care,2011,34 (Suppl 1):S11-S61.     Assessment:  Diabetes control: good control (HgbA1C at goal)  Progress toward A1C goal:  at goal  Comments: excellent inprovement in HgbA1c  Plan:  Medications:  continue current medications  Home glucose monitoring:   Frequency:  (only test once a week)   Timing: before meals  Instruction/counseling given: discussed foot care and discussed diet  Educational resources provided: brochure  Self management tools provided: home glucose logbook  Other plans:

## 2012-07-06 NOTE — Progress Notes (Signed)
  Subjective:    Patient ID: Roy Jennings, male    DOB: 1980-11-22, 31 y.o.   MRN: [id number]  HPI  Mr. Jacki Cones is a 31 year old recently diagnosed with diabetes earlier this year. Hemoglobin A1c was approximately 10 at that time. Since then he's been initiated on metformin 500 mg twice a day and 50 units of 70/30 insulin therapy 3 times a day with meals. Hemoglobin A1c show improvement to goal at 6.7 today. States that he has a mild headache today but otherwise without complaints denies nausea vomiting or diarrhea associated with metformin therapy. Denies chest pain, shortness of breath or lower extremity edema.  Review of Systems  Constitutional: Negative for fever and fatigue.  HENT: Negative for congestion.   Respiratory: Negative for shortness of breath.   Cardiovascular: Negative for chest pain.  Genitourinary: Negative for frequency.  Musculoskeletal: Negative for joint swelling and arthralgias.  Neurological: Positive for headaches. Negative for dizziness, weakness, light-headedness and numbness.       Objective:   Physical Exam  Constitutional: He is oriented to person, place, and time. He appears well-developed and well-nourished. No distress.       pleasant African American male  HENT:  Head: Normocephalic and atraumatic.  Eyes: Conjunctivae normal and EOM are normal. Pupils are equal, round, and reactive to light.  Neck: Normal range of motion. Neck supple. No thyromegaly present.  Cardiovascular: Normal rate, regular rhythm, normal heart sounds and intact distal pulses.   Pulmonary/Chest: Effort normal and breath sounds normal. He has no rales.  Abdominal: Soft. Bowel sounds are normal.  Musculoskeletal: Normal range of motion. He exhibits no edema.  Neurological: He is alert and oriented to person, place, and time.  Skin: Skin is warm and dry.  Psychiatric: He has a normal mood and affect.          Assessment & Plan:  #1 diabetes mellitus type 2, controlled: HgbA1c  6.7 today, patient inquiring whether or not he'll be able to get off insulin if he continues to have good diabetes control -current therapy of 70/30 insulin 50 units twice a day with meals and metformin 500 mg twice a day -Check renal status today Bmet -Discussed with patient that if he continues to remain well controlled could consider increasing metformin and decreasing the insulin; it was further explained that this would depend on his renal function and glycemic control with decreasing insulin    Goals (1 Years of Data) as of 07/06/2012          As of Today 03/28/12 03/02/12 02/08/12 01/05/12     Blood Pressure    . Blood Pressure < 140/90  141/90 133/90 132/84 138/89 132/83    . Blood Pressure < 140/90  141/90 133/90 132/84 138/89 132/83     Result Component    . HEMOGLOBIN A1C < 7.0  6.7   10.2     . HEMOGLOBIN A1C < 7.0  6.7   10.2     . LDL CALC < 100      119    . LDL CALC < 100      119

## 2012-07-18 ENCOUNTER — Ambulatory Visit: Payer: Self-pay

## 2012-07-21 ENCOUNTER — Ambulatory Visit: Payer: Self-pay

## 2012-08-27 ENCOUNTER — Encounter (HOSPITAL_COMMUNITY): Payer: Self-pay | Admitting: Emergency Medicine

## 2012-08-27 ENCOUNTER — Emergency Department (INDEPENDENT_AMBULATORY_CARE_PROVIDER_SITE_OTHER): Payer: Self-pay

## 2012-08-27 ENCOUNTER — Emergency Department (HOSPITAL_COMMUNITY)
Admission: EM | Admit: 2012-08-27 | Discharge: 2012-08-27 | Disposition: A | Discharge status: Home / Self Care | Payer: Self-pay | Source: Home / Self Care | Attending: Family Medicine | Admitting: Family Medicine

## 2012-08-27 DIAGNOSIS — J40 Bronchitis, not specified as acute or chronic: Secondary | ICD-10-CM

## 2012-08-27 MED ORDER — ALBUTEROL SULFATE HFA 108 (90 BASE) MCG/ACT IN AERS
1.0000 | INHALATION_SPRAY | Freq: Four times a day (QID) | RESPIRATORY_TRACT | Status: DC | PRN
Start: 2012-08-27 — End: 2013-01-10

## 2012-08-27 MED ORDER — HYDROCOD POLST-CHLORPHEN POLST 10-8 MG/5ML PO LQCR: 5.0000 mL | Freq: Two times a day (BID) | ORAL | Status: DC

## 2012-08-27 MED ORDER — HYDROCODONE-HOMATROPINE 5-1.5 MG/5ML PO SYRP: 5.0000 mL | ORAL_SOLUTION | Freq: Four times a day (QID) | ORAL | Status: DC | PRN

## 2012-08-27 MED ORDER — AZITHROMYCIN 250 MG PO TABS: 250.0000 mg | ORAL_TABLET | Freq: Every day | ORAL | Status: DC

## 2012-08-27 MED ORDER — ALBUTEROL SULFATE HFA 108 (90 BASE) MCG/ACT IN AERS
INHALATION_SPRAY | RESPIRATORY_TRACT | Status: AC
  Filled 2012-08-27: qty 6.7

## 2012-08-27 MED ORDER — ALBUTEROL SULFATE HFA 108 (90 BASE) MCG/ACT IN AERS: 1.0000 | INHALATION_SPRAY | Freq: Four times a day (QID) | RESPIRATORY_TRACT | Status: DC | PRN

## 2012-08-27 NOTE — ED Provider Notes (Signed)
History     CSN: [id number]  Arrival date & time 08/27/12  1119   First MD Initiated Contact with Patient 08/27/12 1125      Chief Complaint  Patient presents with  . URI    (Consider location/radiation/quality/duration/timing/severity/associated sxs/prior treatment) Patient is a 32 y.o. male presenting with cough. The history is provided by the patient. No language interpreter was used.  Cough This is a new problem. The current episode started more than 1 week ago. The problem occurs constantly. The problem has been gradually worsening. The cough is non-productive. There has been no fever. The fever has been present for 3 to 4 days. He has tried nothing for the symptoms. The treatment provided no relief. He is not a smoker.   Pt complains of a cough and congestion for the past 3 weeks Past Medical History  Diagnosis Date  . Dizziness   . DKA (diabetic ketoacidoses) 12/30/11    new dx DM  . Diabetes mellitus 12/30/11    dx'd today  . 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 Right bundle branch block     Past Surgical History  Procedure Date  . Knee arthroscopy 1999-2000    bilateral    Family History  Problem Relation Age of Onset  . Diabetes Father   . Diabetes Maternal Grandmother     History  Substance Use Topics  . Smoking status: Never Smoker   . Smokeless tobacco: Never Used  . Alcohol Use: Yes     Comment: Wine/beer every 1-2 weeks      Review of Systems  Respiratory: Positive for cough.   All other systems reviewed and are negative.    Allergies  Review of patient's allergies indicates no known allergies.  Home Medications   Current Outpatient Rx  Name  Route  Sig  Dispense  Refill  . INSULIN ASPART PROT & ASPART (70-30) 100 UNIT/ML [place] SUSP   Subcutaneous   Inject 50 Units into the skin 2 (two) times daily with a meal. Any generic formulation of insulin 70/30 is acceptable.   30  mL   1   . ASPIRIN EC 81 MG PO TBEC   Oral   Take 81 mg by mouth daily.         . INSULIN SYRINGE 31G X 5/16" 1 ML MISC   Does not apply   1 each by Does not apply route 3 (three) times daily.   100 each   11   . METFORMIN HCL 500 MG PO TABS   Oral   Take 1 tablet (500 mg total) by mouth 2 (two) times daily with a meal.   60 tablet   0   . NYSTATIN 100000 UNIT/GM EX CREA   Topical   Apply topically 2 (two) times daily. Over affected area for two weeks   30 g   1     BP 141/91  Pulse 111  Temp 98 F (36.7 C) (Oral)  Resp 18  SpO2 96%  Physical Exam  Nursing note and vitals reviewed. Constitutional: He appears well-developed and well-nourished.  HENT:  Head: Normocephalic.  Right Ear: External ear normal.  Left Ear: External ear normal.  Nose: Nose normal.  Mouth/Throat: Oropharynx is clear and moist.  Eyes: Conjunctivae normal are normal. Pupils are equal, round, and reactive to light.  Neck: Normal range of motion.  Cardiovascular: Normal rate and normal heart sounds.  Pulmonary/Chest: Breath sounds normal.       rhonchi  Abdominal: Soft.  Musculoskeletal: Normal range of motion.  Neurological: He is alert.  Skin: Skin is warm.  Psychiatric: He has a normal mood and affect.    ED Course  Procedures (including critical care time)  Labs Reviewed - No data to display Dg Chest 2 View  08/27/2012  *RADIOLOGY REPORT*  Clinical Data: Cough, fever  CHEST - 2 VIEW  Comparison: 12/31/2011  Findings: Cardiomediastinal silhouette is stable.  No acute infiltrate or pulmonary edema.  Bilateral basilar atelectasis. Bony thorax is stable.  IMPRESSION: No acute infiltrate or pulmonary edema.  Bilateral basilar atelectasis.   Original Report Authenticated By: Natasha Mead, M.D.      No diagnosis found.    MDM  Zithromax, tussionex and albuterol.   Follow up at opc to be seen next week for recheck       Elson Areas, [place] 08/27/12 1241

## 2012-08-27 NOTE — ED Notes (Addendum)
Pt c/o cold sx x4 days Sx include: cough w/green mucous, fevers, nauseas, headaches, not sleeping well, red eyes Denies: vomiting, diarrhea Taking Robitussin and alkaseltzer to alleviate the sx  Hx of DM  He is alert w/no signs of acute distress.

## 2012-08-29 NOTE — ED Provider Notes (Signed)
Medical screening examination/treatment/procedure(s) were performed by resident physician or non-physician practitioner and as supervising physician I was immediately available for consultation/collaboration.   Abby Tucholski,Collie DOUGLAS MD.    Cotey D Darian Ace, MD 08/29/12 1559 

## 2012-10-14 ENCOUNTER — Other Ambulatory Visit: Payer: Self-pay | Admitting: *Deleted

## 2012-10-14 DIAGNOSIS — E119 Type 2 diabetes mellitus without complications: Secondary | ICD-10-CM

## 2012-10-14 MED ORDER — INSULIN ASPART PROT & ASPART (70-30 MIX) 100 UNIT/ML ~~LOC~~ SUSP: 50.0000 [IU] | Freq: Two times a day (BID) | SUBCUTANEOUS | Status: DC

## 2012-10-17 NOTE — Telephone Encounter (Signed)
Rx called in to pharmacy. 

## 2012-10-18 ENCOUNTER — Telehealth: Payer: Self-pay | Admitting: Dietician

## 2012-10-18 NOTE — Telephone Encounter (Signed)
Agree with Relion 70/30 and appointment as soon as possible.

## 2012-10-18 NOTE — Telephone Encounter (Signed)
Asked by triage nurse to assist with insulin. Patient's wife says he cannot afford the Novolog Mix 70/30. Advised that relion 70/30 is over the counter and it is 1 unit to 1 unit conversion and to take it 30 minutes before meal rather than 15. Pt's wife verbalized understanding.  she reports pt only takes insulin in the mornings as he has been weaning himself off. She said he had an upcoming appointment but I only see one with [facility]. Will ask front office to schedule with PCP as soon as possible.

## 2012-10-20 ENCOUNTER — Ambulatory Visit: Payer: Self-pay

## 2012-10-26 ENCOUNTER — Ambulatory Visit: Payer: Self-pay

## 2013-01-09 ENCOUNTER — Encounter: Payer: Self-pay | Admitting: Internal Medicine

## 2013-01-09 ENCOUNTER — Ambulatory Visit (INDEPENDENT_AMBULATORY_CARE_PROVIDER_SITE_OTHER): Payer: PRIVATE HEALTH INSURANCE | Admitting: Internal Medicine

## 2013-01-09 VITALS — BP 128/76 | HR 72 | Temp 98.1°F | Resp 20 | Ht 72.0 in | Wt 275.4 lb

## 2013-01-09 DIAGNOSIS — E7800 Pure hypercholesterolemia, unspecified: Secondary | ICD-10-CM

## 2013-01-09 DIAGNOSIS — Z23 Encounter for immunization: Secondary | ICD-10-CM

## 2013-01-09 DIAGNOSIS — Z0000 Encounter for general adult medical examination without abnormal findings: Secondary | ICD-10-CM

## 2013-01-09 DIAGNOSIS — E669 Obesity, unspecified: Secondary | ICD-10-CM

## 2013-01-09 DIAGNOSIS — E119 Type 2 diabetes mellitus without complications: Secondary | ICD-10-CM

## 2013-01-09 LAB — GLUCOSE, CAPILLARY: Glucose-Capillary: 72 mg/dL (ref 70–99)

## 2013-01-09 LAB — POCT GLYCOSYLATED HEMOGLOBIN (HGB A1C): Hemoglobin A1C: 6.2

## 2013-01-09 MED ORDER — INSULIN ASPART PROT & ASPART (70-30 MIX) 100 UNIT/ML ~~LOC~~ SUSP
40.0000 [IU] | Freq: Two times a day (BID) | SUBCUTANEOUS | Status: DC
Start: 2013-01-09 — End: 2013-03-16

## 2013-01-09 NOTE — Patient Instructions (Signed)
General Instructions: -Inject 40 units of insulin aspart two time per day (morning and evening). -Set up an alarm on your cell phone to remind you to take metformin twice per day, every day.  -Measure your blood sugar for one week before breakfast, before lunch, and before dinner. It is very important that you bring this information with you during your next visit--please bring your meter to your next visit as well as a glucose log book.  -Follow up with [place] within one month for further diabetes care.  -Congratulations on losing 30lbs!   Treatment Goals:  Goals (1 Years of Data) as of 01/09/13         As of Today 08/27/12 07/06/12 03/28/12 03/02/12     Blood Pressure    . Blood Pressure < 140/90  128/76 141/91 141/90 133/90 132/84    . Blood Pressure < 140/90  128/76 141/91 141/90 133/90 132/84     Result Component    . HEMOGLOBIN A1C < 7.0  6.2  6.7      . HEMOGLOBIN A1C < 7.0  6.2  6.7      . LDL CALC < 100          . LDL CALC < 100            Progress Toward Treatment Goals:  Treatment Goal 01/09/2013  Hemoglobin A1C improved    Self Care Goals & Plans:  Self Care Goal 01/09/2013  Manage my medications take my medicines as prescribed; refill my medications on time; bring my medications to every visit  Monitor my health bring my glucose meter and log to each visit; keep track of my blood glucose  Eat healthy foods eat fruit for snacks and desserts; drink diet soda or water instead of juice or soda; eat smaller portions; eat baked foods instead of fried foods  Be physically active -    Home Blood Glucose Monitoring 01/09/2013  Check my blood sugar (No Data)  When to check my blood sugar before breakfast     Care Management & Community Referrals:  Referral 01/09/2013  Referrals made for care management support none needed

## 2013-01-10 ENCOUNTER — Encounter: Payer: Self-pay | Admitting: Dietician

## 2013-01-10 DIAGNOSIS — Z0000 Encounter for general adult medical examination without abnormal findings: Secondary | ICD-10-CM | POA: Insufficient documentation

## 2013-01-10 LAB — LIPID PANEL
HDL: 33 mg/dL — ABNORMAL LOW (ref >39)
LDL Cholesterol: 155 mg/dL — ABNORMAL HIGH (ref 0–99)
Total CHOL/HDL Ratio: 6.6 Ratio

## 2013-01-10 LAB — MICROALBUMIN / CREATININE URINE RATIO
Creatinine, Urine: 386.7 mg/dL
Microalb Creat Ratio: 3.8 mg/g (ref 0.0–30.0)

## 2013-01-10 NOTE — Progress Notes (Signed)
  Subjective:    Patient ID: Norvin Ohlin, male    DOB: 1981/05/22, 32 y.o.   MRN: [id number]  HPI Mr. Ringenberg is a pleasant 32 yr old AA man with PMH of obesity, DM2, and hyperlipidemia who comes in accompanied by his son Jeri Modena, for diabetes follow up visit. Since his last visit he has lost over 30 lbs by eating healthier and by swimming weekly. Today his HgA1C is 6.2%. He continues to inject aspart insulin 70/30 50 units twice daily but checks his CBG every 2-3 days. He had two episodes of headache this past month in the afternoon between lunch and dinner but he did not measure his CBG at that time. He denies having tremors, irritability, blurry vision, or dizziness during those times. He usually does not snack between lunch and dinner. He reports having tingling in his lower legs once every 2-3 days that come and go.    Review of Systems  Constitutional: Negative for diaphoresis.  Respiratory: Negative for cough and shortness of breath.   Cardiovascular: Negative for chest pain.  Gastrointestinal: Negative for diarrhea.  Skin: Negative for rash and wound.  Neurological: Positive for headaches. Negative for dizziness and tremors.  Psychiatric/Behavioral: Negative for agitation.       Objective:   Physical Exam  Nursing note and vitals reviewed. Constitutional: He is oriented to person, place, and time. He appears well-developed. No distress.  obese  Eyes: Conjunctivae are normal.  Cardiovascular: Normal rate and intact distal pulses.   Pulmonary/Chest: Effort normal. No respiratory distress.  Abdominal: Soft.  Musculoskeletal: He exhibits no edema and no tenderness.  Neurological: He is alert and oriented to person, place, and time.  Skin: Skin is warm and dry. No rash noted. He is not diaphoretic. No erythema. No pallor.  Psychiatric: He has a normal mood and affect. His behavior is normal.          Assessment & Plan:

## 2013-01-10 NOTE — Assessment & Plan Note (Signed)
I congratulated him on losing 30lbs by adhering to a healthy diet and exercising. He states that he will continue working hard to lose weight.

## 2013-01-10 NOTE — Assessment & Plan Note (Addendum)
Lab Results  Component Value Date   HGBA1C 6.2 01/09/2013   HGBA1C 6.7 07/06/2012   HGBA1C 10.2 02/08/2012     Assessment: Diabetes control: good control (HgbA1C at goal) Progress toward A1C goal:  improved Comments: He is losing weight by eating healthier and exercising more. Urine cr/microalbumin wnl.  Plan: Medications:  continue current medications. He has made great progress while on metformin 500mg  BID and insulin aspart 70/30 50 units BID and lifestyle changes.  Home glucose monitoring: Frequency:  (once every 3 days) Timing: before breakfast Instruction/counseling given: reminded to get eye exam, reminded to bring blood glucose meter & log to each visit and discussed foot care Educational resources provided: brochure Self management tools provided: instructions for home glucose monitoring Other plans: The patient was counseled on the importance of taking metformin everyday, as he has been forgetting to take it 2-3 times per week. He was also encouraged to check his CBG daily. He was instructed to bring a CBG log for a week with measurement 3 times daily before breakfast, before lunch, and before dinner before he can be considered for possible oral therapy only. He was encouraged to bring this CBG log and CBG meter to his next appointment within one month. He agreed and understood. --Although he does not know for certain if his two episodes of headache were hypoglycemic episodes (he did not check his CBG at those times), his insulin aspart 70/30 will be reduced to 40 units BID (from 50 units BID) as discussed with Dr. Kem Kays.

## 2013-01-10 NOTE — Assessment & Plan Note (Signed)
He has lost weight (over 30lbs) and reports eating a healthier diet.  Lipid panel ordered   Addendum: LDL of 155. He will benefit from resuming statin therapy to be discussed during his next visit.

## 2013-01-10 NOTE — Assessment & Plan Note (Signed)
Tdap and Pneumovax given during this visit.

## 2013-01-11 NOTE — Progress Notes (Signed)
Case discussed with Dr. Kennerly at the time of the visit.  We reviewed the resident's history and exam and pertinent patient test results.  I agree with the assessment, diagnosis and plan of care documented in the resident's note. 

## 2013-02-13 ENCOUNTER — Encounter: Payer: PRIVATE HEALTH INSURANCE | Admitting: Internal Medicine

## 2013-03-16 ENCOUNTER — Other Ambulatory Visit: Payer: Self-pay | Admitting: *Deleted

## 2013-03-16 DIAGNOSIS — E119 Type 2 diabetes mellitus without complications: Secondary | ICD-10-CM

## 2013-03-16 MED ORDER — INSULIN ASPART PROT & ASPART (70-30 MIX) 100 UNIT/ML ~~LOC~~ SUSP: 40.0000 [IU] | Freq: Two times a day (BID) | SUBCUTANEOUS | Status: DC

## 2013-03-17 NOTE — Telephone Encounter (Signed)
Rx faxed in.

## 2013-05-08 ENCOUNTER — Ambulatory Visit (INDEPENDENT_AMBULATORY_CARE_PROVIDER_SITE_OTHER): Payer: PRIVATE HEALTH INSURANCE | Admitting: Internal Medicine

## 2013-05-08 ENCOUNTER — Encounter: Payer: Self-pay | Admitting: Internal Medicine

## 2013-05-08 VITALS — BP 113/72 | Ht 71.0 in | Wt 272.2 lb

## 2013-05-08 DIAGNOSIS — E669 Obesity, unspecified: Secondary | ICD-10-CM

## 2013-05-08 DIAGNOSIS — E785 Hyperlipidemia, unspecified: Secondary | ICD-10-CM

## 2013-05-08 DIAGNOSIS — Z0000 Encounter for general adult medical examination without abnormal findings: Secondary | ICD-10-CM

## 2013-05-08 DIAGNOSIS — E7800 Pure hypercholesterolemia, unspecified: Secondary | ICD-10-CM

## 2013-05-08 DIAGNOSIS — E119 Type 2 diabetes mellitus without complications: Secondary | ICD-10-CM

## 2013-05-08 LAB — POCT GLYCOSYLATED HEMOGLOBIN (HGB A1C): Hemoglobin A1C: 5.7

## 2013-05-08 MED ORDER — METFORMIN HCL 1000 MG PO TABS: 1000.0000 mg | ORAL_TABLET | Freq: Two times a day (BID) | ORAL | Status: DC

## 2013-05-08 MED ORDER — PRAVASTATIN SODIUM 40 MG PO TABS: 40.0000 mg | ORAL_TABLET | Freq: Every day | ORAL | Status: DC

## 2013-05-08 MED ORDER — INSULIN ASPART PROT & ASPART (70-30 MIX) 100 UNIT/ML ~~LOC~~ SUSP: SUBCUTANEOUS | Status: DC

## 2013-05-08 NOTE — Patient Instructions (Addendum)
General Instructions: -Congratulations on losing more weight and keeping it off! -Your Hemoglobin A1C is well controlled today! -Start taking Pravachol 40mg  once daily for your cholesterol.  -Start using 25 units of the Novolog 70/30 insulin twice per day (morning and evening) with meals.  -Start taking metformin 1000mg  twice per day with meals.  -Check your blood sugars 3 times per day before meals for at least one week prior to your follow up visit.  -Follow up with Rudell Cobb for the [facility] application.  -Follow up with [place] in one month.    Treatment Goals:  Goals (1 Years of Data) as of 05/08/13         As of Today 01/09/13 08/27/12 07/06/12 03/28/12     Blood Pressure    . Blood Pressure < 140/90  113/72 128/76 141/91 141/90 133/90    . Blood Pressure < 140/90  113/72 128/76 141/91 141/90 133/90     Result Component    . HEMOGLOBIN A1C < 7.0  5.7 6.2  6.7     . HEMOGLOBIN A1C < 7.0  5.7 6.2  6.7     . LDL CALC < 100   155       . LDL CALC < 100   155         Progress Toward Treatment Goals:  Treatment Goal 05/08/2013  Hemoglobin A1C at goal    Self Care Goals & Plans:  Self Care Goal 05/08/2013  Manage my medications take my medicines as prescribed; refill my medications on time  Monitor my health keep track of my blood glucose; bring my glucose meter and log to each visit  Eat healthy foods eat foods that are low in salt; eat baked foods instead of fried foods; drink diet soda or water instead of juice or soda  Be physically active find an activity I enjoy    Home Blood Glucose Monitoring 05/08/2013  Check my blood sugar once a day  When to check my blood sugar after breakfast     Care Management & Community Referrals:  Referral 05/08/2013  Referrals made for care management support none needed

## 2013-05-09 NOTE — Assessment & Plan Note (Addendum)
Lab Results  Component Value Date   HGBA1C 5.7 05/08/2013   HGBA1C 6.2 01/09/2013   HGBA1C 6.7 07/06/2012     Assessment: Diabetes control: good control (HgbA1C at goal) Progress toward A1C goal:  at goal Comments: He is on Novolog 70/30 50 units BID ac and metformin 500mg  BID ac. He denies hypoglycemic events with no tremors, headaches, or increased irritability. He has been checking his CBG once daily after breakfast with values in the 120-140s range.   Plan: Medications:  Will decrease insulin by 50% and increase metformin  Home glucose monitoring: Frequency: once a day Timing: after breakfast Instruction/counseling given: reminded to get eye exam, reminded to bring blood glucose meter & log to each visit, reminded to bring medications to each visit and discussed the need for weight loss Educational resources provided: brochure Self management tools provided: home glucose logbook Other plans: His Hg A1c is 5.7%. Decreased insulin to Novolog 25 units BID ac and metformin 1000mg  BID ac. Pt advised to check his CBG 3 times per day, before meals. Follow up in one month. The ultimate goal for him is to come off insulin therapy which may be possible as he continues to lose weight.

## 2013-05-09 NOTE — Assessment & Plan Note (Addendum)
Needs the flu vaccine, to be given during his next visit.

## 2013-05-09 NOTE — Assessment & Plan Note (Signed)
His last lipid panel in June revealed an LDL of 155. He has made significant lifestyle modifications with intentional weight loss of over 40lbs but he likely warrants statin tx given his LDL goal of <100.  Rx pravastatin 40mg  daily

## 2013-05-09 NOTE — Progress Notes (Signed)
  Subjective:    Patient ID: Roy Jennings, male    DOB: February 14, 1981, 32 y.o.   MRN: [id number]  HPI Roy Jennings is a 32 year old man with PMH of DM2, hyperlipidemia, and obesity who presents for follow up visit. It had been more than 3 months since he was last seen at the [facility] but he continued to follow a healthier diet and walking almost everyday, losing another 3 lbs for a total of over 40 lb intentional weight loss since he was diagnosed with diabetes.  He had no complaints during this visit.     Review of Systems  Constitutional: Negative for fever, chills, appetite change, fatigue and unexpected weight change.  Respiratory: Negative for cough and shortness of breath.   Cardiovascular: Negative for chest pain, palpitations and leg swelling.  Gastrointestinal: Negative for abdominal pain.  Musculoskeletal: Negative for arthralgias.  Neurological: Negative for dizziness, tremors and light-headedness.  Psychiatric/Behavioral: Negative for behavioral problems and agitation.       Objective:   Physical Exam  Nursing note and vitals reviewed. Constitutional: He is oriented to person, place, and time. He appears well-developed and well-nourished. No distress.  Eyes: Conjunctivae are normal. No scleral icterus.  Cardiovascular: Normal rate and regular rhythm.   Pulmonary/Chest: Effort normal and breath sounds normal. No respiratory distress. He has no wheezes. He has no rales.  Abdominal: Soft. There is no tenderness.  Musculoskeletal: He exhibits no edema and no tenderness.  Neurological: He is alert and oriented to person, place, and time.  Skin: Skin is warm and dry. No rash noted. He is not diaphoretic. No erythema.  Psychiatric: He has a normal mood and affect. His behavior is normal.          Assessment & Plan:

## 2013-05-09 NOTE — Assessment & Plan Note (Signed)
He has kept the weigh off and lost an additional 3 lbs since his diagnoses with diabetes. I congratulated him on such an achivement. He plans to continue losing weight by walking for 20-30 minutes almost every day and by eating a healthy, low fat diet. He set a goal to lose another 20lbs by the Spring of next year.

## 2013-05-15 NOTE — Progress Notes (Signed)
Case discussed with Dr. Kennerly soon after the resident saw the patient.  We reviewed the resident's history and exam and pertinent patient test results.  I agree with the assessment, diagnosis and plan of care documented in the resident's note. 

## 2013-06-12 ENCOUNTER — Encounter: Payer: Self-pay | Admitting: Internal Medicine

## 2013-09-18 ENCOUNTER — Encounter: Payer: Self-pay | Admitting: Internal Medicine

## 2013-10-30 ENCOUNTER — Encounter: Payer: Self-pay | Admitting: Internal Medicine

## 2013-10-30 ENCOUNTER — Ambulatory Visit (INDEPENDENT_AMBULATORY_CARE_PROVIDER_SITE_OTHER): Payer: 59 | Admitting: Internal Medicine

## 2013-10-30 VITALS — BP 122/79 | HR 90 | Temp 99.0°F | Ht 71.0 in | Wt 262.0 lb

## 2013-10-30 DIAGNOSIS — E669 Obesity, unspecified: Secondary | ICD-10-CM

## 2013-10-30 DIAGNOSIS — E7800 Pure hypercholesterolemia, unspecified: Secondary | ICD-10-CM

## 2013-10-30 DIAGNOSIS — E119 Type 2 diabetes mellitus without complications: Secondary | ICD-10-CM

## 2013-10-30 LAB — GLUCOSE, CAPILLARY: Glucose-Capillary: 90 mg/dL (ref 70–99)

## 2013-10-30 LAB — POCT GLYCOSYLATED HEMOGLOBIN (HGB A1C): HEMOGLOBIN A1C: 5.6

## 2013-10-30 MED ORDER — METFORMIN HCL 1000 MG PO TABS: 1000.0000 mg | ORAL_TABLET | Freq: Two times a day (BID) | ORAL | Status: DC

## 2013-10-30 MED ORDER — GLIPIZIDE 5 MG PO TABS: 5.0000 mg | ORAL_TABLET | Freq: Two times a day (BID) | ORAL | Status: DC

## 2013-10-30 NOTE — Patient Instructions (Signed)
General Instructions: -Stop using insulin -Pick one week this month and check your blood sugar three times per day, before breakfast, before lunch, and before bedtime. Write the numbers down. Call us if your blood sugar is persistently higher than 180s.  -Start taking glipizide 5mg  daily with your two largest meals of the day (lunch and dinner).  -Start taking metformin 1000mg  twice per day with meals.  -Good job changing your diet and exercising! You have lost 10 lbs!  -Follow up with us in 3 months or sooner as needed.    Treatment Goals:  Goals (1 Years of Data) as of 10/30/13         As of Today 05/08/13 01/09/13 08/27/12 07/06/12     Blood Pressure    . Blood Pressure < 140/90  122/79 113/72 128/76 141/91     . Blood Pressure < 140/90  122/79 113/72 128/76 141/91      Result Component    . HEMOGLOBIN A1C < 7.0  5.6 5.7 6.2  6.7    . HEMOGLOBIN A1C < 7.0  5.6 5.7 6.2  6.7    . LDL CALC < 100    155      . LDL CALC < 100    155        Progress Toward Treatment Goals:  Treatment Goal 10/30/2013  Hemoglobin A1C at goal    Self Care Goals & Plans:  Self Care Goal 10/30/2013  Manage my medications take my medicines as prescribed  Monitor my health keep track of my blood glucose; bring my glucose meter and log to each visit; check my feet daily  Eat healthy foods eat foods that are low in salt; eat baked foods instead of fried foods  Be physically active find an activity I enjoy  Meeting treatment goals maintain the current self-care plan    Home Blood Glucose Monitoring 10/30/2013  Check my blood sugar once a day  When to check my blood sugar at bedtime     Care Management & Community Referrals:  Referral 10/30/2013  Referrals made for care management support none needed

## 2013-11-01 NOTE — Assessment & Plan Note (Addendum)
He is following a healthier diet with no sweets and no fried foods. He is exercising (walking) weekly. He has lost 10lbs since I last saw him. I congratulated him on his weight loss and encouraged him to keep following a healthy diet with exercise. Wants to lose another 10lbs by June this year.  -Referred pt to diabetes coordinator for further coaching in lifestyle changes.

## 2013-11-01 NOTE — Progress Notes (Signed)
   Subjective:    Patient ID: Roy Jennings, male    DOB: 01-Oct-1980, 33 y.o.   MRN: [id number]  Diabetes Pertinent negatives for hypoglycemia include no dizziness or headaches. Pertinent negatives for diabetes include no chest pain, no fatigue and no weakness.   [name] is a 33 yr old man with PMH of DM2, obesity, who presents for follow up visit for his diabetes. Since his last visit, 6 months ago, he has lost 10lbs. He has been exercising (walking) 4-5 times per week, following a low fat.low carb diet. He states that his metformin/insulin compliance is not consistent, he misses doses at least 2 times per week. He does not have his CBG meter with him during this visit but has had values in the 100-120s --he checks his CBG once per day at bedtime. He denies hypoglycemic episodes with no headaches, dizziness, sweating, or blurred vision.    Review of Systems  Constitutional: Negative for fever, chills, diaphoresis, activity change, appetite change and fatigue.  Respiratory: Negative for cough, shortness of breath and wheezing.   Cardiovascular: Negative for chest pain, palpitations and leg swelling.  Gastrointestinal: Negative for abdominal pain and diarrhea.  Genitourinary: Negative for frequency.  Neurological: Negative for dizziness, weakness, light-headedness and headaches.  Psychiatric/Behavioral: Negative for agitation.       Objective:   Physical Exam  Nursing note and vitals reviewed. Constitutional: He is oriented to person, place, and time. He appears well-developed and well-nourished. No distress.  Cardiovascular: Normal rate.   Pulmonary/Chest: Effort normal and breath sounds normal. No respiratory distress. He has no wheezes. He has no rales.  Abdominal: Soft. He exhibits no distension. There is no tenderness.  Musculoskeletal: He exhibits no edema and no tenderness.  Neurological: He is alert and oriented to person, place, and time.  Skin: Skin is warm and dry. He is not  diaphoretic.  Psychiatric: He has a normal mood and affect. His behavior is normal.          Assessment & Plan:

## 2013-11-01 NOTE — Assessment & Plan Note (Signed)
He has not filled prescription for pravastatin. Given his recent lifestyle modification and weight loss, pt desires to stay off statin at this time, will continue with weight loss goal with lipid panel recheck during next visit.

## 2013-11-01 NOTE — Assessment & Plan Note (Signed)
Lab Results  Component Value Date   HGBA1C 5.6 10/30/2013   HGBA1C 5.7 05/08/2013   HGBA1C 6.2 01/09/2013     Assessment: Diabetes control: good control (HgbA1C at goal) Progress toward A1C goal:  at goal Comments: He has not checked his BS every day but has reads in the 100-120s at bedtime. He, and his wife, have made very healthy changes to their diet and he has consequently lost 10 lbs. He does not have his CBG meter with him during this visit but based on his Hg A1C value of 5.6% during this visit and 5.7 % during his last visit, his BS have been in the 100-120s range.  He has missed many doses of insulin and has not increased metformin to 1000mg  BID as discussed during his last visit. During this visit, he requests a trial of being off insulin and I think he is ready for it so long as he stays compliant to his oral medications and his new lifestyle.   Plan: Medications:  Discontinue Novolog 70/30 25 units BID. Increase metformin to 1000mg  BID, add glipizide 5mg  ac (with lunch and diiner) Home glucose monitoring: Frequency: once a day Timing: at bedtime Instruction/counseling given: reminded to get eye exam, reminded to bring blood glucose meter & log to each visit, reminded to bring medications to each visit, discussed the need for weight loss and discussed diet Educational resources provided:   Self management tools provided: home glucose logbook Other plans: Patient instructed to check his CBG 3 times per day before meals for at least one week and call us if CBGs persistently above 180. Patient counseled on potential hypoglycemia while taking metformin and glipizide.  Lupita Leashonna Plyler, diabetes coordinator informed of medication changes and will follow with patient on progress of medication transitioning.  Patient referred to ophthalmology for diabetic eye exam.

## 2013-11-02 ENCOUNTER — Telehealth: Payer: Self-pay | Admitting: Dietician

## 2013-11-02 NOTE — Telephone Encounter (Signed)
Patient says no low blood sugars, blood sugars are 'doing fine'. He was at work so could not talk long or given me numbers. Asked him to call back with numbers for his blood sugars and leave it on my voicemail. He acknowledged appointment with eye doctor.

## 2013-11-02 NOTE — Telephone Encounter (Signed)
Sounds good. Thanks for checking on him!  SK

## 2013-11-06 NOTE — Progress Notes (Signed)
Case discussed with Dr. Kennerly soon after the resident saw the patient.  We reviewed the resident's history and exam and pertinent patient test results.  I agree with the assessment, diagnosis, and plan of care documented in the resident's note. 

## 2014-01-01 ENCOUNTER — Ambulatory Visit (INDEPENDENT_AMBULATORY_CARE_PROVIDER_SITE_OTHER): Payer: 59 | Admitting: Internal Medicine

## 2014-01-01 ENCOUNTER — Encounter: Payer: Self-pay | Admitting: Internal Medicine

## 2014-01-01 VITALS — BP 125/79 | HR 73 | Temp 98.2°F | Wt 262.8 lb

## 2014-01-01 DIAGNOSIS — E7800 Pure hypercholesterolemia, unspecified: Secondary | ICD-10-CM

## 2014-01-01 DIAGNOSIS — E119 Type 2 diabetes mellitus without complications: Secondary | ICD-10-CM

## 2014-01-01 DIAGNOSIS — E669 Obesity, unspecified: Secondary | ICD-10-CM

## 2014-01-01 LAB — POCT GLYCOSYLATED HEMOGLOBIN (HGB A1C): HEMOGLOBIN A1C: 5.8

## 2014-01-01 LAB — GLUCOSE, CAPILLARY: GLUCOSE-CAPILLARY: 109 mg/dL — AB (ref 70–99)

## 2014-01-01 NOTE — Patient Instructions (Signed)
General Instructions: -Call the manufacture of your glucose meter. If they can't replace it, let us know so we can order a new one for you.  -Your weight is stable but continue working on exercising more and decreasing the intake of sugary drinks.   -Start checking your blood sugar once per day.  -Follow up in one month.    Treatment Goals:  Goals (1 Years of Data) as of 01/01/14         As of Today 10/30/13 05/08/13 01/09/13 08/27/12     Blood Pressure    . Blood Pressure < 140/90  125/79 122/79 113/72 128/76 141/91    . Blood Pressure < 140/90  125/79 122/79 113/72 128/76 141/91     Result Component    . HEMOGLOBIN A1C < 7.0  5.8 5.6 5.7 6.2     . HEMOGLOBIN A1C < 7.0  5.8 5.6 5.7 6.2     . LDL CALC < 100     155     . LDL CALC < 100     155       Progress Toward Treatment Goals:  Treatment Goal 01/01/2014  Hemoglobin A1C at goal    Self Care Goals & Plans:  Self Care Goal 01/01/2014  Manage my medications -  Monitor my health bring my glucose meter and log to each visit  Eat healthy foods -  Be physically active -  Meeting treatment goals -    Home Blood Glucose Monitoring 01/01/2014  Check my blood sugar no home glucose monitoring  When to check my blood sugar -     Care Management & Community Referrals:  Referral 01/01/2014  Referrals made for care management support none needed

## 2014-01-03 NOTE — Progress Notes (Signed)
Case discussed with Dr. Kennerly at the time of the visit.  We reviewed the resident's history and exam and pertinent patient test results.  I agree with the assessment, diagnosis, and plan of care documented in the resident's note. 

## 2014-01-03 NOTE — Assessment & Plan Note (Signed)
He continues to walk for 30-45 minutes 2 times per week. He continues to avoid fried foods but still drinks juices. His weight is stable but he is motivated to lose another 10-15lbs. He declined referral to the [facility] health coach.

## 2014-01-03 NOTE — Assessment & Plan Note (Signed)
Lab Results  Component Value Date   HGBA1C 5.8 01/01/2014   HGBA1C 5.6 10/30/2013   HGBA1C 5.7 05/08/2013     Assessment: Diabetes control: good control (HgbA1C at goal) Progress toward A1C goal:  at goal Comments: Roy Jennings is on metformin 1000mg  BID and glipizide 5mg  BID ac. Roy Jennings did not have his CBG meter with him during this visit and states that his meter is malfunctioning. Roy Jennings will call the manufacturer of his CBG meter and ask for a new meter. Roy Jennings was advised to call us if Roy Jennings is unable to get a new meter. His HgbA1C is 5.8% during this visit, if Roy Jennings continues to follow a healthy diet, Roy Jennings could come off glipizide but Roy Jennings needs a meter for daily CBG monitoring before we can do this and Roy Jennings agrees.    Plan: Medications:  continue current medications Home glucose monitoring: Frequency: no home glucose monitoring Timing:   Instruction/counseling given: reminded to get eye exam, reminded to bring medications to each visit, discussed foot care, discussed the need for weight loss and discussed diet Educational resources provided:   Self management tools provided:   Other plans: Roy Jennings will follow up in one month, at that time we will consider doing a trial off the glipizide if his BS have been stable and if Roy Jennings has a working meter.

## 2014-01-03 NOTE — Progress Notes (Signed)
   Subjective:    Patient ID: Roy Jennings, male    DOB: 1981-04-12, 33 y.o.   MRN: [id number]  Diabetes Pertinent negatives for hypoglycemia include no dizziness, headaches or tremors. Pertinent negatives for diabetes include no chest pain, no fatigue and no weakness.   [name] is a 65 yr man with PMH of DM2, obesity, and hyperlipidemia who presents for follow up visit for his diabetes. His weight is stable since his last visit, he has not gained nor lost weight.  He has not used insulin since his last visit with me. His CBG meter has not worked well in the last weeks and he has not checked his blood sugar. He denies dizziness, tremors, or s/s of hypoglycemia.    Review of Systems  Constitutional: Negative for fever, chills, diaphoresis, activity change, appetite change and fatigue.  Respiratory: Negative for cough, shortness of breath and wheezing.   Cardiovascular: Negative for chest pain, palpitations and leg swelling.  Gastrointestinal: Negative for abdominal pain, diarrhea and constipation.  Genitourinary: Negative for dysuria.  Neurological: Negative for dizziness, tremors, weakness, light-headedness and headaches.  Psychiatric/Behavioral: Negative for behavioral problems and agitation.       Objective:   Physical Exam  Nursing note and vitals reviewed. Constitutional: He is oriented to person, place, and time. He appears well-developed and well-nourished. No distress.  Eyes: Conjunctivae are normal. No scleral icterus.  Cardiovascular: Normal rate and regular rhythm.   Pulmonary/Chest: Effort normal and breath sounds normal. No respiratory distress. He has no wheezes. He has no rales. He exhibits no tenderness.  Abdominal: Soft. There is no tenderness.  Musculoskeletal: He exhibits no edema and no tenderness.  Neurological: He is alert and oriented to person, place, and time.  Skin: Skin is warm and dry. No rash noted. He is not diaphoretic. No erythema. No pallor.    Psychiatric: He has a normal mood and affect. His behavior is normal.          Assessment & Plan:

## 2014-01-03 NOTE — Assessment & Plan Note (Signed)
He wants to continue working on lifestyle modifications and weight loss before we check a lipid panel again. He wants to defer this test until his next visit.

## 2014-02-12 ENCOUNTER — Encounter: Payer: 59 | Admitting: Internal Medicine

## 2014-02-19 ENCOUNTER — Ambulatory Visit (INDEPENDENT_AMBULATORY_CARE_PROVIDER_SITE_OTHER): Payer: 59 | Admitting: Internal Medicine

## 2014-02-19 ENCOUNTER — Encounter: Payer: Self-pay | Admitting: Internal Medicine

## 2014-02-19 VITALS — BP 125/80 | HR 82 | Temp 98.4°F | Wt 258.0 lb

## 2014-02-19 DIAGNOSIS — E669 Obesity, unspecified: Secondary | ICD-10-CM

## 2014-02-19 DIAGNOSIS — E119 Type 2 diabetes mellitus without complications: Secondary | ICD-10-CM

## 2014-02-19 DIAGNOSIS — Z0000 Encounter for general adult medical examination without abnormal findings: Secondary | ICD-10-CM

## 2014-02-19 DIAGNOSIS — S51852A Open bite of left forearm, initial encounter: Secondary | ICD-10-CM

## 2014-02-19 MED ORDER — METFORMIN HCL 1000 MG PO TABS: 500.0000 mg | ORAL_TABLET | Freq: Two times a day (BID) | ORAL | Status: DC

## 2014-02-19 MED ORDER — ADULT MULTIVITAMIN W/MINERALS CH: 1.0000 | ORAL_TABLET | Freq: Every day | ORAL | Status: DC

## 2014-02-19 MED ORDER — METFORMIN HCL 1000 MG PO TABS
500.0000 mg | ORAL_TABLET | Freq: Every day | ORAL | Status: DC
Start: 2014-02-19 — End: 2014-10-03

## 2014-02-19 NOTE — Patient Instructions (Signed)
General Instructions: -Start taking metformin 500mg  daily with lunch.  -Check your blood sugar at least once per day in the mornings or at bedtime everyday.  -Follow up in 3 months or sooner as needed.    Treatment Goals:  Goals (1 Years of Data) as of 02/19/14         As of Today 01/01/14 10/30/13 05/08/13 01/09/13     Blood Pressure    . Blood Pressure < 140/90  125/80 125/79 122/79 113/72 128/76    . Blood Pressure < 140/90  125/80 125/79 122/79 113/72 128/76     Result Component    . HEMOGLOBIN A1C < 7.0   5.8 5.6 5.7 6.2    . HEMOGLOBIN A1C < 7.0   5.8 5.6 5.7 6.2    . LDL CALC < 100      155    . LDL CALC < 100      155      Progress Toward Treatment Goals:  Treatment Goal 02/19/2014  Hemoglobin A1C at goal    Self Care Goals & Plans:  Self Care Goal 02/19/2014  Manage my medications -  Monitor my health bring my glucose meter and log to each visit  Eat healthy foods -  Be physically active -  Meeting treatment goals maintain the current self-care plan    Home Blood Glucose Monitoring 02/19/2014  Check my blood sugar 3 times a day  When to check my blood sugar before meals     Care Management & Community Referrals:  Referral 02/19/2014  Referrals made for care management support none needed

## 2014-02-20 DIAGNOSIS — Z3169 Encounter for other general counseling and advice on procreation: Secondary | ICD-10-CM | POA: Insufficient documentation

## 2014-02-20 DIAGNOSIS — S51852A Open bite of left forearm, initial encounter: Secondary | ICD-10-CM | POA: Insufficient documentation
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
## 2014-02-20 NOTE — Assessment & Plan Note (Signed)
He will start taking a daily MV while he and his wife are trying to conceive.

## 2014-02-20 NOTE — Progress Notes (Signed)
   Subjective:    Patient ID: Roy Jennings, male    DOB: Jun 21, 1981, 33 y.o.   MRN: [id number]  Diabetes Pertinent negatives for hypoglycemia include no dizziness. Pertinent negatives for diabetes include no chest pain and no weakness.   [name] is a [age] yr man with PMH of DM2, obesity, and hyperlipidemia who presents for follow up visit for his diabetes. He has lost 4 lbs since his last visit last month. He reports that he continues to eat a healthy diet and is exercising regularly. He stopped taking glipizide on his own as he felt like this medication was making his blood sugars run low. He is taking metformin 1000mg  once per day with dinner. He has brought his CBG meter but only has readings for this past week which show one low of 64. He tells me he was asymptomatic at that time and had skipped breakfast.  He and his wife are planning to get pregnant and have tried for one month. They both have a healthy son that is 34 years old. [name] tells me that his libido is reduced but denies symptoms of erectile dysfunction.    Review of Systems  Constitutional: Negative for fever, chills, activity change and appetite change.  Respiratory: Negative for cough and shortness of breath.   Cardiovascular: Negative for chest pain and palpitations.  Gastrointestinal: Negative for abdominal pain.  Neurological: Negative for dizziness, weakness and light-headedness.  Psychiatric/Behavioral: Negative for agitation.       Objective:   Physical Exam  Nursing note and vitals reviewed. Constitutional: He is oriented to person, place, and time. He appears well-developed and well-nourished. No distress.  Cardiovascular: Normal rate and regular rhythm.   Pulmonary/Chest: Effort normal. No respiratory distress. He has no wheezes.  Abdominal: Soft.  Musculoskeletal: He exhibits no edema.  Neurological: He is alert and oriented to person, place, and time.  Skin: He is not diaphoretic.  Left anterior  forearm with ~2cm yellow scab with no surrounding edema, erythema, no increased warmth, no discharge, no TTP, does not appear infected.   Psychiatric: He has a normal mood and affect. His behavior is normal.          Assessment & Plan:

## 2014-02-20 NOTE — Assessment & Plan Note (Signed)
Lab Results  Component Value Date   HGBA1C 5.8 01/01/2014   HGBA1C 5.6 10/30/2013   HGBA1C 5.7 05/08/2013     Assessment: Diabetes control: good control (HgbA1C at goal) Progress toward A1C goal:  at goal Comments: He took himself off glipizide. He has been taking metformin 1000mg  with dinner. He has CBG readings for the past week with readings in the 1202-130s before meals and one reading of 64 before lunch. He denies feeling s/s of hypoglycemia at that time and explains that he had skipped breakfast that day.   Plan: Medications:  discontinued glipizide. Reduce metforming to 500mg  with lunch daily.  Home glucose monitoring: Frequency: 3 times a day Timing: before meals Instruction/counseling given: reminded to bring blood glucose meter & log to each visit and discussed the need for weight loss Educational resources provided: brochure;other (see comments) Self management tools provided: copy of home glucose meter download Other plans: He was advised to check his blood sugar at least once per day and call us if it getting higher. He may eventually not need metformin as he continues to exercise and lose weight.

## 2014-02-20 NOTE — Assessment & Plan Note (Signed)
He has lost 4 lbs since last month. His ideal body weight for a BMI <25 is 133-179 lbs. He is has increased muscle mass from working out and his current weight is 258lbs.

## 2014-02-21 NOTE — Progress Notes (Signed)
Case discussed with Dr. Kennerly soon after the resident saw the patient.  We reviewed the resident's history and exam and pertinent patient test results.  I agree with the assessment, diagnosis, and plan of care documented in the resident's note. 

## 2014-06-18 ENCOUNTER — Encounter: Payer: Self-pay | Admitting: Internal Medicine

## 2014-06-18 ENCOUNTER — Ambulatory Visit (INDEPENDENT_AMBULATORY_CARE_PROVIDER_SITE_OTHER): Payer: 59 | Admitting: Internal Medicine

## 2014-06-18 VITALS — BP 125/78 | HR 82 | Temp 98.0°F | Ht 71.0 in | Wt 262.7 lb

## 2014-06-18 DIAGNOSIS — K529 Noninfective gastroenteritis and colitis, unspecified: Secondary | ICD-10-CM

## 2014-06-18 DIAGNOSIS — E7800 Pure hypercholesterolemia, unspecified: Secondary | ICD-10-CM

## 2014-06-18 DIAGNOSIS — Z0000 Encounter for general adult medical examination without abnormal findings: Secondary | ICD-10-CM

## 2014-06-18 DIAGNOSIS — E119 Type 2 diabetes mellitus without complications: Secondary | ICD-10-CM

## 2014-06-18 DIAGNOSIS — E1165 Type 2 diabetes mellitus with hyperglycemia: Secondary | ICD-10-CM

## 2014-06-18 DIAGNOSIS — E669 Obesity, unspecified: Secondary | ICD-10-CM

## 2014-06-18 LAB — HM DIABETES EYE EXAM

## 2014-06-18 LAB — POCT GLYCOSYLATED HEMOGLOBIN (HGB A1C): Hemoglobin A1C: 5.8

## 2014-06-18 LAB — GLUCOSE, CAPILLARY: Glucose-Capillary: 94 mg/dL (ref 70–99)

## 2014-06-18 NOTE — Assessment & Plan Note (Signed)
He declined the flu vaccine today.

## 2014-06-18 NOTE — Patient Instructions (Signed)
General Instructions: -You may use Preparation H or Tucks pads as needed.  -Continue taking metformin 500mg  daily.  -Continue cutting back on carbohydrates and sweets.  -You may take a daily multivitamin.  -Please consider getting the flu vaccine.  -Follow up in 3 months.   Have a wonderful Holiday Season!   Please bring your medicines with you each time you come to clinic.  Medicines may include prescription medications, over-the-counter medications, herbal remedies, eye drops, vitamins, or other pills.   Progress Toward Treatment Goals:  Treatment Goal 06/18/2014  Hemoglobin A1C at goal    Self Care Goals & Plans:  Self Care Goal 06/18/2014  Manage my medications take my medicines as prescribed; bring my medications to every visit  Monitor my health keep track of my blood glucose; bring my glucose meter and log to each visit  Eat healthy foods drink diet soda or water instead of juice or soda; eat more vegetables; eat foods that are low in salt; eat baked foods instead of fried foods; eat fruit for snacks and desserts  Be physically active -  Meeting treatment goals -    Home Blood Glucose Monitoring 06/18/2014  Check my blood sugar once a day  When to check my blood sugar before breakfast     Care Management & Community Referrals:  Referral 06/18/2014  Referrals made for care management support none needed      ,

## 2014-06-18 NOTE — Progress Notes (Signed)
Patient ID: Roy Jennings, male   DOB: [birth year], 33 y.o.   MRN: [id number] Case discussed with Dr. Garald BraverKennerly soon after the resident saw the patient.  We reviewed the resident's history and exam and pertinent patient test results.  I agree with the assessment, diagnosis, and plan of care documented in the resident's note.

## 2014-06-18 NOTE — Assessment & Plan Note (Signed)
He had fever/chills with multiple diarrhea BMs last week that is improving.  He has mild rectal itching from multiple BMs.   -Advised pt to use preparation H of Tucks pads to help with anal discomfort. Pt to follow up with us if diarrhea worsens.

## 2014-06-18 NOTE — Assessment & Plan Note (Addendum)
Lab Results  Component Value Date   HGBA1C 5.8 06/18/2014   HGBA1C 5.8 01/01/2014   HGBA1C 5.6 10/30/2013     Assessment: Diabetes control: good control (HgbA1C at goal) Progress toward A1C goal:  at goal Comments: He is on metformin 500mg  daily with meal  Plan: Medications:  continue current medications Home glucose monitoring: Frequency: once a day Timing: before breakfast Instruction/counseling given: reminded to get eye exam, discussed the need for weight loss and discussed diet Educational resources provided: brochure (has information) Self management tools provided:   Other plans: Follow in 3 months. Performed diabetic foot exam. Checked lipid panel and urine micro/cr. Had retinal scan today.

## 2014-06-18 NOTE — Assessment & Plan Note (Signed)
Checked lipid panel today.  He may need statin, he is considering this tx.

## 2014-06-18 NOTE — Progress Notes (Signed)
   Subjective:    Patient ID: Roy Jennings, male    DOB: [birth year], 33 y.o.   MRN: [id number]  HPI [name] is a 33 year old man with PMH of Dm2, obesity, and hyperlipidemia, presenting for follow up visit for diabetes. He tells me that Thursday last week he started having liquid non bloody diarrhea multiple times per day with fever and chills. His wife also had similar symptoms. His stools are still soft/liquid but he now has a BM only once or twice per day. He is tolerating a diet well. He has had rectal itching due to multiple BMs.  Denies hypoglycemia, has had glycemic control with before breakfast reads in the 100-110 mg/dL per his recall (he forgot to bring his glucometer).    Review of Systems  Constitutional: Negative for fever, chills, diaphoresis, activity change, appetite change, fatigue and unexpected weight change.  Respiratory: Negative for cough and shortness of breath.   Cardiovascular: Negative for chest pain and leg swelling.  Gastrointestinal: Positive for abdominal pain, diarrhea and rectal pain. Negative for nausea, vomiting and blood in stool.  Genitourinary: Negative for dysuria, frequency and difficulty urinating.  Musculoskeletal: Negative for back pain.  Neurological: Negative for dizziness and light-headedness.  Psychiatric/Behavioral: Negative for agitation.       Objective:   Physical Exam  Constitutional: He is oriented to person, place, and time. He appears well-developed and well-nourished. No distress.  Moist mucus membranes   Cardiovascular: Normal rate and regular rhythm.   No murmur heard. Pulmonary/Chest: Effort normal and breath sounds normal. No respiratory distress. He has no wheezes. He has no rales.  Abdominal: Soft. Bowel sounds are normal. He exhibits no distension and no mass. There is no tenderness. There is no rebound and no guarding.  Musculoskeletal: He exhibits no edema or tenderness.  Neurological: He is alert and oriented to person,  place, and time. Coordination normal.  Skin: Skin is warm and dry. He is not diaphoretic.  Psychiatric: He has a normal mood and affect.  Nursing note and vitals reviewed.         Assessment & Plan:

## 2014-06-18 NOTE — Assessment & Plan Note (Signed)
He gained 4 lbs since his last visit. He is cutting back on sweet drinks, no longer drinks sweet tea.

## 2014-06-19 LAB — LIPID PANEL
CHOLESTEROL: 178 mg/dL (ref 0–200)
HDL: 27 mg/dL — ABNORMAL LOW (ref >39)
LDL CALC: 96 mg/dL (ref 0–99)
Total CHOL/HDL Ratio: 6.6 Ratio
Triglycerides: 275 mg/dL — ABNORMAL HIGH (ref <150)
VLDL: 55 mg/dL — AB (ref 0–40)

## 2014-06-19 LAB — MICROALBUMIN / CREATININE URINE RATIO
CREATININE, URINE: 402.8 mg/dL
MICROALB UR: 1.9 mg/dL (ref <2.0)
MICROALB/CREAT RATIO: 4.7 mg/g (ref 0.0–30.0)

## 2014-06-22 ENCOUNTER — Encounter: Payer: Self-pay | Admitting: *Deleted

## 2014-10-03 ENCOUNTER — Ambulatory Visit (INDEPENDENT_AMBULATORY_CARE_PROVIDER_SITE_OTHER): Payer: 59 | Admitting: Family Medicine

## 2014-10-03 ENCOUNTER — Encounter: Payer: Self-pay | Admitting: Family Medicine

## 2014-10-03 VITALS — BP 120/80 | HR 80 | Temp 98.1°F | Ht 71.0 in | Wt 275.0 lb

## 2014-10-03 DIAGNOSIS — E7800 Pure hypercholesterolemia, unspecified: Secondary | ICD-10-CM

## 2014-10-03 DIAGNOSIS — E119 Type 2 diabetes mellitus without complications: Secondary | ICD-10-CM

## 2014-10-03 DIAGNOSIS — K6289 Other specified diseases of anus and rectum: Secondary | ICD-10-CM

## 2014-10-03 MED ORDER — HYDROCORTISONE 2.5 % RE CREA: 1.0000 "application " | TOPICAL_CREAM | Freq: Two times a day (BID) | RECTAL | Status: DC

## 2014-10-03 NOTE — Assessment & Plan Note (Signed)
a1c up to 13 at diagnosis. Lost 30 lbs. Now off all medications-was on insulin and metformin before. Check a1c today. Weight trending up and we planned a strategy to turn this tide by working on eating fewer processed foods and exercising again.

## 2014-10-03 NOTE — Assessment & Plan Note (Signed)
2015.LDL <100 on no rx. Trig >200 . Consider statin for primary prevention at age 34.

## 2014-10-03 NOTE — Assessment & Plan Note (Signed)
Rectal pain without clear etiology on exam with no fissure or active hemorrhoid. Anoscopy did not show any internal hemorrhoids (though would not expect these to cause itching or pain). Did have hemorrhoid tag which makes me suspicious for hemorrhoids so we will trial 2.5% hydrocortisone cream. He has had some spotting on toilet paper but never any in toilet which also makes me lean towards hemorrhoids. I offered GI referral for repeat evaluation and consideration of colonoscopy but patient declines. No family history colon cancer is reassuring and typically would not present as rectal pain.

## 2014-10-03 NOTE — Patient Instructions (Addendum)
Update a1c, kidney function Check blood counts given blood in stool at times  Journal your symptoms. 3-6 month follow up depending on a1c.   Rectal pain-Could be hemorrhoids coming and going. If this persists, may consider GI referral. You can also call and we will refer you if desired.   Witch hazel or preparation H would be fine if cream is expensive that I sent in for you

## 2014-10-03 NOTE — Progress Notes (Signed)
Pre visit review using our clinic review tool, if applicable. No additional management support is needed unless otherwise documented below in the visit note. 

## 2014-10-03 NOTE — Progress Notes (Signed)
Roy ConchStephen Hunter, MD Phone: [phone]  Subjective:  Patient presents today to establish care. Chief complaint-noted.   Rectal Pain Started after a stomach virus in late 2015 with no history prior. Pain is worst with bowel movements. He also experiences a Burning and itching. Has tried hemorrhoid cream and helps some. Has felt no bulge. Small light stain of blood about once a week. Sometimes can be several days in a row, at other times every other day. Bowel movements twice a day with no straining.  ROS-  No melena. No dysuria (or other urinary symptoms as below)  DIABETES Type II-good control previously  Lab Results  Component Value Date   HGBA1C 5.8 06/18/2014   HGBA1C 5.8 01/01/2014   HGBA1C 5.6 10/30/2013  Medications taking and tolerating-no longer on medication after >30 lbs weight gain, but has regained some weight and is no longer exercising Daily foot monitoring-yes  ROS- Denies Polyuria,Polydipsia, nocturia, Vision changes, feet or hand numbness/pain/tingling. Denies  Hypoglycemia symptoms (shaky, sweaty, hungry, weak anxious, tremor, palpitations, confusion, behavior change).   Hyperlipidemia-good control of LDL not on medication but trig >200 Lab Results  Component Value Date   LDLCALC 96 06/18/2014  On statin: no Regular exercise: no, advised Diet: has been eating a lot of processed foods ROS- no chest pain or shortness of breath. No myalgias   The following were reviewed and entered/updated in epic: Past Medical History  Diagnosis Date  . DKA (diabetic ketoacidoses) 12/30/11    new dx DM a1c 13  . Diabetes mellitus 12/30/11  . PVC (premature ventricular contraction)     LBB inferior axis QRS // [facility]  . Right bundle branch block    Patient Active Problem List   Diagnosis Date Noted  . Diabetes type 2, controlled 12/31/2011    Priority: High  . Hypercholesteremia 01/05/2012    Priority: Medium  . Obesity, Class II, BMI 35-39.9  01/07/2011    Priority: Medium  . Rectal pain 10/03/2014    Priority: Low  . PVC (premature ventricular contraction) 01/07/2011    Priority: Low   Past Surgical History  Procedure Laterality Date  . Knee arthroscopy  1999-2000    bilateral    Family History  Problem Relation Age of Onset  . Diabetes Father   . Diabetes Maternal Grandmother     Medications- reviewed and updated Current Outpatient Prescriptions  Medication Sig Dispense Refill  . aspirin EC 81 MG tablet Take 81 mg by mouth daily.    . Multiple Vitamin (MULTIVITAMIN WITH MINERALS) TABS tablet Take 1 tablet by mouth daily. (Patient not taking: Reported on 10/03/2014)       Allergies-reviewed and updated No Known Allergies  History   Social History  . Marital Status: Married    Spouse Name: N/A  . Number of Children: 1  . Years of Education: BS   Occupational History  .      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 Smoking status: Never Smoker   . Smokeless tobacco: Never Used  . Alcohol Use: 0.0 oz/week    0 Standard drinks or equivalent per week     Comment: Wine/beer every 1-2 weeks  . Drug Use: No  . Sexual Activity: Yes   Other Topics Concern  . None   Social History Narrative   Married. Son '08. 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 Oropharynx normal. TM normal. Good dentition Eyes: sclera and lids normal, PERRLA Neck: no thyromegaly, no cervical lymphadenopathy CV: RRR no murmurs rubs or gallops Lungs: CTAB no crackles, wheeze,  rhonchi Abdomen: soft/nontender/nondistended/normal bowel sounds. No rebound or guarding.  Rectal: hemorrhoid tag at 7 pm, anoscope attempted x 2, patient did not tolerate first attempt, 2nd attempt limited but in views obtained, no internal hemorrhoids noted Ext: no edema Skin: warm, dry Neuro: 5/5 strength in upper and lower extremities, normal gait, normal reflexes   Assessment/Plan:  Diabetes type 2, controlled a1c up to 13 at diagnosis. Lost 30 lbs. Now off all medications-was on insulin and metformin before. Check a1c today. Weight trending up and we planned a strategy to turn this tide by working on eating fewer processed foods and exercising again.    Hypercholesteremia 2015.LDL <100 on no rx. Trig >200 . Consider statin for primary prevention at age 34.    Rectal pain Rectal pain without clear etiology on exam with no fissure or active hemorrhoid. Anoscopy did not show any internal hemorrhoids (though would not expect these to cause itching or pain). Did have hemorrhoid tag which makes me suspicious for hemorrhoids so we will trial 2.5% hydrocortisone cream. He has had some spotting on toilet paper but never any in toilet which also makes me lean towards hemorrhoids. I offered GI referral for repeat evaluation and consideration of colonoscopy but patient declines. No family history colon cancer is reassuring and typically would not present as rectal pain.    3-6 month follow up depending on a1c or prn follow up for rectal pain.   Meds ordered this encounter  Medications  . hydrocortisone (ANUSOL-HC) 2.5 % rectal cream    Sig: Place 1 application rectally 2 (two) times daily. 7 days max    Dispense:  30 g    Refill:  3

## 2014-10-04 LAB — CBC
HCT: 43.7 % (ref 39.0–52.0)
HEMOGLOBIN: 14.3 g/dL (ref 13.0–17.0)
MCHC: 32.7 g/dL (ref 30.0–36.0)
MCV: 81.8 fl (ref 78.0–100.0)
PLATELETS: 349 10*3/uL (ref 150.0–400.0)
RBC: 5.34 Mil/uL (ref 4.22–5.81)
RDW: 14.2 % (ref 11.5–15.5)
WBC: 7.8 10*3/uL (ref 4.0–10.5)

## 2014-10-04 LAB — COMPREHENSIVE METABOLIC PANEL
ALBUMIN: 4.5 g/dL (ref 3.5–5.2)
ALT: 19 U/L (ref 0–53)
AST: 17 U/L (ref 0–37)
Alkaline Phosphatase: 65 U/L (ref 39–117)
BUN: 11 mg/dL (ref 6–23)
CALCIUM: 9.9 mg/dL (ref 8.4–10.5)
CHLORIDE: 103 meq/L (ref 96–112)
CO2: 29 mEq/L (ref 19–32)
Creatinine, Ser: 1.21 mg/dL (ref 0.40–1.50)
GFR: 87.88 mL/min (ref >60.00)
Glucose, Bld: 88 mg/dL (ref 70–99)
POTASSIUM: 3.9 meq/L (ref 3.5–5.1)
SODIUM: 138 meq/L (ref 135–145)
TOTAL PROTEIN: 8.1 g/dL (ref 6.0–8.3)
Total Bilirubin: 0.6 mg/dL (ref 0.2–1.2)

## 2014-10-04 LAB — HEMOGLOBIN A1C: HEMOGLOBIN A1C: 6.5 % (ref 4.6–6.5)

## 2014-11-13 ENCOUNTER — Ambulatory Visit (INDEPENDENT_AMBULATORY_CARE_PROVIDER_SITE_OTHER): Payer: 59 | Admitting: Family Medicine

## 2014-11-13 ENCOUNTER — Encounter: Payer: Self-pay | Admitting: Family Medicine

## 2014-11-13 VITALS — BP 115/80 | HR 88 | Temp 98.2°F | Ht 71.0 in | Wt 279.0 lb

## 2014-11-13 DIAGNOSIS — K648 Other hemorrhoids: Secondary | ICD-10-CM

## 2014-11-13 DIAGNOSIS — K6289 Other specified diseases of anus and rectum: Secondary | ICD-10-CM

## 2014-11-13 DIAGNOSIS — K644 Residual hemorrhoidal skin tags: Secondary | ICD-10-CM

## 2014-11-13 MED ORDER — BENZOCAINE 20 % RE OINT: TOPICAL_OINTMENT | RECTAL | Status: DC | PRN

## 2014-11-13 MED ORDER — HYDROCORTISONE 2.5 % RE CREA: 1.0000 "application " | TOPICAL_CREAM | Freq: Two times a day (BID) | RECTAL | Status: DC

## 2014-11-13 NOTE — Progress Notes (Signed)
Pre visit review using our clinic review tool, if applicable. No additional management support is needed unless otherwise documented below in the visit note. 

## 2014-11-13 NOTE — Assessment & Plan Note (Signed)
External hemorrhoid on exam today as likely cause. Offered GI referral but declined. Not using hydrocortisone BID as instructed and we discussed BID dosing and then stopping after a week. Also, We will trial benzocaine 20% rectal ointment first and if does not have signficant improvement will call in for us to place referral.

## 2014-11-13 NOTE — Patient Instructions (Signed)
Active hemorrhoid around 7 or 8 o clock  Can use hydrocortisone cream twice a day still  Then use the numbing agent I gave you in between  If you are not satisfied with results within 2-3 weeks or if recurrence not helped by this, call me and I will refer you to GI for evaluation. You do not have to come see me for me to enter this for you but id be happy to see you if you want.

## 2014-11-13 NOTE — Progress Notes (Signed)
  Tana ConchStephen Mory Herrman, MD Phone: [phone]  Subjective:   Roy FewJames R Jennings is a 34 y.o. year old very pleasant male patient who presents with the following:  Rectal Pain/hemorrhoids Used hydrocortisone cream but mostly once a day for many days in a row. Has a new area that is very tender and itchy. Using baby wipes to wipe. Seems to be stable in pain level rated as mild to moderate worse with BM over about a week. Has noted slight tinge of pink staining on toilet paper. Daily BM no straining.  ROS- no fever/chills/nauseaa/vomiting. Does not feel any rectal tear or open sore.   Past Medical History- Patient Active Problem List   Diagnosis Date Noted  . Diabetes type 2, controlled 12/31/2011    Priority: High  . Hypercholesteremia 01/05/2012    Priority: Medium  . Obesity, Class II, BMI 35-39.9 01/07/2011    Priority: Medium  . Rectal pain 10/03/2014    Priority: Low  . PVC (premature ventricular contraction) 01/07/2011    Priority: Low   Medications- reviewed and updated Current Outpatient Prescriptions  Medication Sig Dispense Refill  . hydrocortisone (ANUSOL-HC) 2.5 % rectal cream Place 1 application rectally 2 (two) times daily. 7 days max 30 g 3  . aspirin EC 81 MG tablet Take 81 mg by mouth daily.     Objective: BP 115/80 mmHg  Pulse 88  Temp(Src) 98.2 F (36.8 C) (Oral)  Ht 5\' 11"  (1.803 m)  Wt 279 lb (126.554 kg)  BMI 38.93 kg/m2 NAD, resting comfortably on table Rectal with good tone. Defers internal exam. External exam with hemorrhoid external at aroudn 7 or 8 o clock. Not thrombosed but tender to touch.   Assessment/Plan:  Rectal pain External hemorrhoid on exam today as likely cause. Offered GI referral but declined. Not using hydrocortisone BID as instructed and we discussed BID dosing and then stopping after a week. Also, We will trial benzocaine 20% rectal ointment first and if does not have signficant improvement will call in for us to place referral.    will  call if not significant improvement and we will call in GI referral. May have internal hemorrhoids and benefit from banding.   Meds ordered this encounter  Medications  . benzocaine (AMERICAINE) 20 % rectal ointment    Sig: Place rectally every 3 (three) hours as needed for pain.    Dispense:  28.4 g    Refill:  3  . hydrocortisone (ANUSOL-HC) 2.5 % rectal cream    Sig: Place 1 application rectally 2 (two) times daily. 7 days max    Dispense:  30 g    Refill:  3

## 2014-12-10 ENCOUNTER — Telehealth: Payer: Self-pay | Admitting: Family Medicine

## 2014-12-10 DIAGNOSIS — K6289 Other specified diseases of anus and rectum: Secondary | ICD-10-CM

## 2014-12-10 NOTE — Telephone Encounter (Signed)
Pt advised pt cb if not better form visit 4/12. Pt still having hemmorrid issues.

## 2014-12-10 NOTE — Telephone Encounter (Signed)
[facility] will call pt to schedule appointment.

## 2014-12-10 NOTE — Telephone Encounter (Signed)
Referral entered  

## 2014-12-11 ENCOUNTER — Encounter: Payer: Self-pay | Admitting: Physician Assistant

## 2014-12-28 ENCOUNTER — Encounter: Payer: Self-pay | Admitting: Physician Assistant

## 2014-12-28 ENCOUNTER — Ambulatory Visit (INDEPENDENT_AMBULATORY_CARE_PROVIDER_SITE_OTHER): Payer: 59 | Admitting: Physician Assistant

## 2014-12-28 VITALS — BP 132/74 | HR 75 | Ht 71.0 in | Wt 281.0 lb

## 2014-12-28 DIAGNOSIS — K6289 Other specified diseases of anus and rectum: Secondary | ICD-10-CM

## 2014-12-28 DIAGNOSIS — K602 Anal fissure, unspecified: Secondary | ICD-10-CM | POA: Diagnosis not present

## 2014-12-28 MED ORDER — DILTIAZEM GEL 2 %: 1.0000 "application " | Freq: Two times a day (BID) | CUTANEOUS | Status: DC

## 2014-12-28 NOTE — Patient Instructions (Addendum)
Continue the Americaine 3-4 times daily. Do tub soaks and use moist wipes instead of toilet paper after a bowel movement. Try to keep stools soft. You can use a stool softener if needed.   We called in a prescription to [facility], [address]  This is located in the [address] between [address] and [address]. Their number is [phone].   Normal BMI (Body Mass Index- based on height and weight) is between 19 and 25. Your BMI today is Body mass index is 39.21 kg/(m^2). Marland Kitchen. Please consider follow up  regarding your BMI with your Primary Care Provider.

## 2014-12-28 NOTE — Progress Notes (Signed)
Patient ID: Roy Jennings, male   DOB: 1980-12-19, 34 y.o.   MRN: [id number]   Subjective:    Patient ID: Roy Jennings, male    DOB: 1980-12-19, 34 y.o.   MRN: [id number]  HPI Roy Jennings is a pleasant 34 year old African-American male referred today by Dr. Durene CalHunter. He is new to GI. He has history of adult-onset diabetes mellitus and hyperlipidemia. He has not had any prior GI issues. He says said 2-3 month history of rectal burning itching and discomfort. He has had scans amount of bright red blood intermittently. His pain seems to be aggravated by bowel movements and more annoying after bowel movements. Initially hemorrhoid medication had not been helping but he has been using benzocaine over the past couple of weeks and that does seem to be helping his symptoms. He says his itching and burning has improved but has not resolved. Appendectomy says he has not had any problems with abdominal pain and changes in bowel habits constipation and excessive straining etc. No family history of colon cancer or IBD. Rectal exam per Dr. Durene CalHunter concerning for external hemorrhoid.  Review of Systems Pertinent positive and negative review of systems were noted in the above HPI section.  All other review of systems was otherwise negative.  Outpatient Encounter Prescriptions as of 12/28/2014  Medication Sig  . aspirin EC 81 MG tablet Take 81 mg by mouth daily.  . benzocaine (AMERICAINE) 20 % rectal ointment Place rectally every 3 (three) hours as needed for pain.  . hydrocortisone (ANUSOL-HC) 2.5 % rectal cream Place 1 application rectally 2 (two) times daily. 7 days max  . diltiazem 2 % GEL Apply 1 application topically 2 (two) times daily.   No facility-administered encounter medications on file as of 12/28/2014.   No Known Allergies Patient Active Problem List   Diagnosis Date Noted  . Rectal pain 10/03/2014  . Hypercholesteremia 01/05/2012  . Diabetes type 2, controlled 12/31/2011  . PVC (premature ventricular  contraction) 01/07/2011  . Obesity, Class II, BMI 35-39.9 01/07/2011   History   Social History  . Marital Status: Married    Spouse Name: N/A  . Number of Children: 1  . Years of Education: BS   Occupational History  .      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 Smoking status: Never Smoker   . Smokeless tobacco: Never Used  . Alcohol Use: 0.0 oz/week    0 Standard drinks or equivalent per week     Comment: Wine/beer every 1-2 weeks  . Drug Use: No  . Sexual Activity: Yes   Other Topics Concern  . Not on file   Social History Narrative   Married. Son '08. 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's family history includes Diabetes in his father and maternal grandmother.      Objective:    Filed Vitals:   12/28/14 0826  BP: 132/74  Pulse: 75    Physical Exam  well-developed young African-American male in no acute distress, anxious blood pressure 132/74 pulse 75 height 5 foot 11 weight 281 HEENT; nontraumatic normocephalic EOMI PERRLA sclera anicteric, Cardiovascular; regular rate and rhythm with S1-S2 no murmur or gallop, Pulmonary clear, Abdomen; soft nontender nondistended bowel sounds present, Rectal; exam there is no  external hemorrhoid or lesion noted he does have a small sentinel tag at the 7:00 position, no excoriation of the perianal skin no evidence of fistula he is exquisitely tender with digital exam and cannot relax enough to permit good exam, stool heme negative, Neuropsych ;mood and affect appropriate       Assessment & Plan:    #1  34 yo male with 2-3 month hx of rectal pain, burning and itching. Sxs  and exam most consistent with anal fissure. #2 AODM  Plan; hot tub soaks twice daily Baby wipes Exline continue Americaine 20% ointment 3-4 times daily as needed Start  diltiazem gel 2% apply internally 3-4 times daily. Long discussion regarding natural course of anal fissures and prolonged healing time. Patient is asked to give this another 6-8 weeks and come back for follow-up if he has not had significant improvement at that time.  Roy Lartigue Oswald Hillock PA-C 12/28/2014   Cc: Shelva Majestic, MD

## 2015-01-01 NOTE — Progress Notes (Signed)
i agree with the above note, plan 

## 2015-07-03 ENCOUNTER — Other Ambulatory Visit: Payer: Self-pay | Admitting: Family Medicine

## 2015-07-30 ENCOUNTER — Emergency Department (HOSPITAL_COMMUNITY)
Admission: EM | Admit: 2015-07-30 | Discharge: 2015-07-30 | Discharge status: Absent without leave | Payer: 59 | Source: Home / Self Care

## 2015-07-30 ENCOUNTER — Ambulatory Visit (INDEPENDENT_AMBULATORY_CARE_PROVIDER_SITE_OTHER): Payer: 59 | Admitting: Family Medicine

## 2015-07-30 ENCOUNTER — Telehealth: Payer: Self-pay | Admitting: Family Medicine

## 2015-07-30 VITALS — BP 138/92 | HR 118 | Temp 98.2°F | Resp 16 | Ht 72.5 in | Wt 274.6 lb

## 2015-07-30 DIAGNOSIS — E1165 Type 2 diabetes mellitus with hyperglycemia: Secondary | ICD-10-CM | POA: Diagnosis not present

## 2015-07-30 DIAGNOSIS — R350 Frequency of micturition: Secondary | ICD-10-CM | POA: Diagnosis not present

## 2015-07-30 LAB — POCT URINALYSIS DIP (MANUAL ENTRY)
Bilirubin, UA: NEGATIVE
Leukocytes, UA: NEGATIVE
Nitrite, UA: NEGATIVE
Protein Ur, POC: 30 — AB
UROBILINOGEN UA: 0.2
pH, UA: 5

## 2015-07-30 LAB — POC MICROSCOPIC URINALYSIS (UMFC)

## 2015-07-30 LAB — GLUCOSE, POCT (MANUAL RESULT ENTRY)

## 2015-07-30 LAB — POCT GLYCOSYLATED HEMOGLOBIN (HGB A1C): HEMOGLOBIN A1C: 11.2

## 2015-07-30 MED ORDER — BLOOD GLUCOSE MONITOR KIT: PACK | Status: DC

## 2015-07-30 MED ORDER — METFORMIN HCL 1000 MG PO TABS: 1000.0000 mg | ORAL_TABLET | Freq: Two times a day (BID) | ORAL | Status: DC

## 2015-07-30 MED ORDER — INSULIN GLARGINE 100 UNIT/ML ~~LOC~~ SOLN
10.0000 [IU] | Freq: Once | SUBCUTANEOUS | Status: AC
  Administered 2015-07-30: 10 [IU] via SUBCUTANEOUS

## 2015-07-30 NOTE — Telephone Encounter (Signed)
Noted  

## 2015-07-30 NOTE — Progress Notes (Addendum)
Subjective:  By signing my name below, I, Roy Jennings, attest that this documentation has been prepared under the direction and in the presence of Delman Cheadle, MD.  [name], Medical Scribe. 07/30/2015.  8:19 PM.   Patient ID: Roy Jennings, male    DOB: [birth year], 34 y.o.   MRN: [id number]  Chief Complaint  Patient presents with  . Abdominal Pain  . Urinary Frequency    HPI HPI Comments: Roy Jennings is a 34 y.o. male who presents to [facility] complaining of abdominal pain.  Pt called his PCP today complaining of abdominal pain, fatigue, polyuria, polydipsia, and urinary frequency. He has a hx of type II DM that resolved with weight loss. Pt was on Lantice 10, and Novolog 7:30:50 BID. His A1C was 13 initially on diagnose, was on Insulin and metformin. He lost 30 lbs and was able to come off medication. A1C was 6.5 at last visit with PCP 10 months ago. He has lost 7 lbs since that visit. Pt reports that he does not regularly checks his blood sugar, he does have a glucometer. Pt used to use 50:50 mix of insulin, and reports doing well on metformin. Pt reports mild light headedness, and lower abdominal pain. He is not currently on any medications. He denies change in appetite, chest pain, dizziness, headaches, vision changes, numbness, weakness, nausea, vomiting, or bowel changes.     Patient Active Problem List   Diagnosis Date Noted  . Rectal pain 10/03/2014  . Hypercholesteremia 01/05/2012  . Diabetes type 2, controlled ([place]) 12/31/2011  . PVC (premature ventricular contraction) 01/07/2011  . Obesity, Class II, BMI 35-39.9 01/07/2011   Past Medical History  Diagnosis Date  . DKA (diabetic ketoacidoses) ([place]) 12/30/11    new dx DM a1c 13  . Diabetes mellitus 12/30/11  . PVC (premature ventricular contraction)     LBB inferior axis QRS // followed by [name] cardiology  . Right bundle branch block    Past Surgical History  Procedure Laterality  Date  . Knee arthroscopy  1999-2000    bilateral   No Known Allergies Prior to Admission medications   Medication Sig Start Date End Date Taking? Authorizing Provider  AMERICAINE 20 % rectal ointment PLACE RECTALLY EVERY 3 HOURS AS NEEDED FOR PAIN 07/03/15  Yes Marin Olp, MD  aspirin EC 81 MG tablet Take 81 mg by mouth daily. Reported on 07/30/2015    Historical Provider, MD  diltiazem 2 % GEL Apply 1 application topically 2 (two) times daily. Patient not taking: Reported on 07/30/2015 12/28/14   Amy S Esterwood, PA-C  hydrocortisone (ANUSOL-HC) 2.5 % rectal cream Place 1 application rectally 2 (two) times daily. 7 days max Patient not taking: Reported on 07/30/2015 11/13/14   Marin Olp, MD   Social History   Social History  . Marital Status: Married    Spouse Name: N/A  . Number of Children: 1  . Years of Education: BS   Occupational History  .      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 Smoking status: Never Smoker   . Smokeless tobacco: Never Used  . Alcohol Use: 0.0 oz/week    0 Standard drinks or equivalent per week     Comment: Wine/beer every 1-2 weeks  . Drug Use: No  . Sexual Activity: Yes   Other Topics Concern  . Not on file   Social History Narrative  Married. Son '08. 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 Negative for appetite change.  Eyes: Negative for visual disturbance.  Respiratory: Negative for shortness of breath.   Cardiovascular: Negative for chest pain.  Gastrointestinal: Positive for abdominal pain. Negative for nausea, vomiting, diarrhea and constipation.  Endocrine: Positive for polydipsia and polyuria.  Genitourinary: Positive for frequency.  Neurological: Positive for light-headedness. Negative for  dizziness, weakness, numbness and headaches.      Objective:   Physical Exam  Constitutional: He is oriented to person, place, and time. He appears well-developed and well-nourished. No distress.  HENT:  Head: Normocephalic and atraumatic.  Eyes: EOM are normal. Pupils are equal, round, and reactive to light.  Neck: Neck supple.  Cardiovascular: Normal rate.   Pulmonary/Chest: Effort normal.  Neurological: He is alert and oriented to person, place, and time. No cranial nerve deficit.  Skin: Skin is warm and dry.  Psychiatric: He has a normal mood and affect. His behavior is normal.  Nursing note and vitals reviewed.   Results for orders placed or performed in visit on 07/30/15  POCT urinalysis dipstick  Result Value Ref Range   Color, UA yellow yellow   Clarity, UA clear clear   Glucose, UA =500 (A) negative   Bilirubin, UA negative negative   Ketones, POC UA trace (5) (A) negative   Spec Grav, UA <=1.005    Blood, UA trace-lysed (A) negative   pH, UA 5.0    Protein Ur, POC =30 (A) negative   Urobilinogen, UA 0.2    Nitrite, UA Negative Negative   Leukocytes, UA Negative Negative  POCT Microscopic Urinalysis ([facility])  Result Value Ref Range   WBC,UR,HPF,POC None None WBC/hpf   RBC,UR,HPF,POC None None RBC/hpf   Bacteria Few (A) None, Too numerous to count   Mucus Present (A) Absent   Epithelial Cells, UR Per Microscopy Few (A) None, Too numerous to count cells/hpf  POCT glycosylated hemoglobin (Hb A1C)  Result Value Ref Range   Hemoglobin A1C 11.2   POCT glucose (manual entry)  Result Value Ref Range   POC Glucose =>444 70 - 99 mg/dl    BP 138/92 mmHg  Pulse 118  Temp(Src) 98.2 F (36.8 C) (Oral)  Resp 16  Ht 6' 0.5" (1.842 m)  Wt 274 lb 9.6 oz (124.558 kg)  BMI 36.71 kg/m2  SpO2 98%      Assessment & Plan:   1. Urinary frequency - due to acute glucose spike  2. Type 2 diabetes mellitus with hyperglycemia, without long-term current use of insulin (HCC)     Given 10u Lantus in office - pt self-administed under guidance. Given 1 Lantus pen and pen needles - cont 10u qhs and start metformin 1000 bid. Will want cbgs to come down quickly before pt becomes dehydrated/hyperosmolar/ARF picture so advised to call in both tomorrow and the following day with cbgs so we can adjust lantus prn and/or add in short acting coverage.  Pt does not know where his glucometer is so rx given for new to get tonight. Reminded pt of low carb/low sugar diet. Push fluids. Recheck in 3d, sooner if any sxs worsen. I anticipate that he will quickly be able to transition off insulin onto oral meds alone hopefully within  this first month but needs f/u w/ PCP at first avail time (sooner than his appt in 6 wks) and may benefit from endocrine eval as i suspect pt has a mixed type 1/2 picture since his current presentation is so acute.  Orders Placed This Encounter  Procedures  . Basic metabolic panel    Order Specific Question:  Has the patient fasted?    Answer:  No  . POCT urinalysis dipstick  . POCT Microscopic Urinalysis ([facility])  . POCT glycosylated hemoglobin (Hb A1C)  . POCT glucose (manual entry)    Meds ordered this encounter  Medications  . metFORMIN (GLUCOPHAGE) 1000 MG tablet    Sig: Take 1 tablet (1,000 mg total) by mouth 2 (two) times daily with a meal.    Dispense:  60 tablet    Refill:  0  . insulin glargine (LANTUS) injection 10 Units    Sig:   . blood glucose meter kit and supplies KIT    Sig: Dispense based on patient and insurance preference. Use up to four times daily as directed. (FOR ICD-9 250.00, 250.01).    Dispense:  1 each    Refill:  0    Order Specific Question:  Number of strips    Answer:  1000    Order Specific Question:  Number of lancets    Answer:  1000    I personally performed the services described in this documentation, which was scribed in my presence. The recorded information has been reviewed and considered, and addended by me as  needed.  Delman Cheadle, MD MPH

## 2015-07-30 NOTE — Telephone Encounter (Signed)
Roy FearingJames called wondering if he could be seen this week due to having stomach pain, tiredness, and frequent urination. I offered him a Friday appointment but he has to work. I also transferred him to the triage nurse. He's not checked his BP recently either. Please give him a call if needed.  Pt's ph# [phone] Thank you.

## 2015-07-30 NOTE — Telephone Encounter (Signed)
Please advise if pt needs to see another provider or if can wait until January appointment.

## 2015-07-30 NOTE — Telephone Encounter (Signed)
Patient Name: Roy MattesJAMES Jennings  DOB: 04-05-1981    Initial Comment caller states he has freq urination   Nurse Assessment  Nurse: Roy FiddlerAdkins, RN, Roy Jennings Date/Time Roy Jennings(Eastern Time): 07/30/2015 3:43:08 PM  Confirm and document reason for call. If symptomatic, describe symptoms. ---Caller states he has had increased urinary frequency in the last 24 hrs but confirms he has been drinking a lot of fluid. He denies burning or painful, no fever or back pain and is very concerned his BS may be elevated again.  Has the patient traveled out of the country within the last 30 days? ---Not Applicable  Does the patient have any new or worsening symptoms? ---Yes  Will a triage be completed? ---Yes  Related visit to physician within the last 2 weeks? ---No  Does the PT have any chronic conditions? (i.e. diabetes, asthma, etc.) ---Yes  List chronic conditions. ---HX Type 2 diabetes/(weight loss was able to come off all medications).  Is this a behavioral health or substance abuse call? ---No     Guidelines    Guideline Title Affirmed Question Affirmed Notes  Urinary Symptoms Urinating more frequently than usual (i.e., frequency)    Final Disposition User   See Physician within 24 Hours Roy FiddlerAdkins, RN, Roy Jennings    Comments  Roy Jennings verbalized understanding that there were no appts available, [facility] work and cb as needed.   [facility]   Disagree/Comply: Comply

## 2015-07-30 NOTE — Patient Instructions (Signed)
Hyperglycemia Hyperglycemia occurs when the glucose (sugar) in your blood is too high. Hyperglycemia can happen for many reasons, but it most often happens to people who do not know they have diabetes or are not managing their diabetes properly.  CAUSES  Whether you have diabetes or not, there are other causes of hyperglycemia. Hyperglycemia can occur when you have diabetes, but it can also occur in other situations that you might not be as aware of, such as: Diabetes  If you have diabetes and are having problems controlling your blood glucose, hyperglycemia could occur because of some of the following reasons:  Not following your meal plan.  Not taking your diabetes medications or not taking it properly.  Exercising less or doing less activity than you normally do.  Being sick. Pre-diabetes  This cannot be ignored. Before people develop Type 2 diabetes, they almost always have "pre-diabetes." This is when your blood glucose levels are higher than normal, but not yet high enough to be diagnosed as diabetes. Research has shown that some long-term damage to the body, especially the heart and circulatory system, may already be occurring during pre-diabetes. If you take action to manage your blood glucose when you have pre-diabetes, you may delay or prevent Type 2 diabetes from developing. Stress  If you have diabetes, you may be "diet" controlled or on oral medications or insulin to control your diabetes. However, you may find that your blood glucose is higher than usual in the hospital whether you have diabetes or not. This is often referred to as "stress hyperglycemia." Stress can elevate your blood glucose. This happens because of hormones put out by the body during times of stress. If stress has been the cause of your high blood glucose, it can be followed regularly by your caregiver. That way he/she can make sure your hyperglycemia does not continue to get worse or progress to  diabetes. Steroids  Steroids are medications that act on the infection fighting system (immune system) to block inflammation or infection. One side effect can be a rise in blood glucose. Most people can produce enough extra insulin to allow for this rise, but for those who cannot, steroids make blood glucose levels go even higher. It is not unusual for steroid treatments to "uncover" diabetes that is developing. It is not always possible to determine if the hyperglycemia will go away after the steroids are stopped. A special blood test called an A1c is sometimes done to determine if your blood glucose was elevated before the steroids were started. SYMPTOMS  Thirsty.  Frequent urination.  Dry mouth.  Blurred vision.  Tired or fatigue.  Weakness.  Sleepy.  Tingling in feet or leg. DIAGNOSIS  Diagnosis is made by monitoring blood glucose in one or all of the following ways:  A1c test. This is a chemical found in your blood.  Fingerstick blood glucose monitoring.  Laboratory results. TREATMENT  First, knowing the cause of the hyperglycemia is important before the hyperglycemia can be treated. Treatment may include, but is not be limited to:  Education.  Change or adjustment in medications.  Change or adjustment in meal plan.  Treatment for an illness, infection, etc.  More frequent blood glucose monitoring.  Change in exercise plan.  Decreasing or stopping steroids.  Lifestyle changes. HOME CARE INSTRUCTIONS   Test your blood glucose as directed.  Exercise regularly. Your caregiver will give you instructions about exercise. Pre-diabetes or diabetes which comes on with stress is helped by exercising.  Eat wholesome,   balanced meals. Eat often and at regular, fixed times. Your caregiver or nutritionist will give you a meal plan to guide your sugar intake.  Being at an ideal weight is important. If needed, losing as little as 10 to 15 pounds may help improve blood  glucose levels. SEEK MEDICAL CARE IF:   You have questions about medicine, activity, or diet.  You continue to have symptoms (problems such as increased thirst, urination, or weight gain). SEEK IMMEDIATE MEDICAL CARE IF:   You are vomiting or have diarrhea.  Your breath smells fruity.  You are breathing faster or slower.  You are very sleepy or incoherent.  You have numbness, tingling, or pain in your feet or hands.  You have chest pain.  Your symptoms get worse even though you have been following your caregiver's orders.  If you have any other questions or concerns.   This information is not intended to replace advice given to you by your health care provider. Make sure you discuss any questions you have with your health care provider.   Document Released: 01/13/2001 Document Revised: 10/12/2011 Document Reviewed: 03/26/2015 Elsevier Interactive Patient Education 2016 [employer] Blood Glucose Monitoring, Adult Monitoring your blood glucose (also know as blood sugar) helps you to manage your diabetes. It also helps you and your health care provider monitor your diabetes and determine how well your treatment plan is working. WHY SHOULD YOU MONITOR YOUR BLOOD GLUCOSE?  It can help you understand how food, exercise, and medicine affect your blood glucose.  It allows you to know what your blood glucose is at any given moment. You can quickly tell if you are having low blood glucose (hypoglycemia) or high blood glucose (hyperglycemia).  It can help you and your health care provider know how to adjust your medicines.  It can help you understand how to manage an illness or adjust medicine for exercise. WHEN SHOULD YOU TEST? Your health care provider will help you decide how often you should check your blood glucose. This may depend on the type of diabetes you have, your diabetes control, or the types of medicines you are taking. Be sure to write down all of your blood glucose  readings so that this information can be reviewed with your health care provider. See below for examples of testing times that your health care provider may suggest. Type 1 Diabetes  Test at least 2 times per day if your diabetes is well controlled, if you are using an insulin pump, or if you perform multiple daily injections.  If your diabetes is not well controlled or if you are sick, you may need to test more often.  It is a good idea to also test:  Before every insulin injection.  Before and after exercise.  Between meals and 2 hours after a meal.  Occasionally between 2:00 a.m. and 3:00 a.m. Type 2 Diabetes  If you are taking insulin, test at least 2 times per day. However, it is best to test before every insulin injection.  If you take medicines by mouth (orally), test 2 times a day.  If you are on a controlled diet, test once a day.  If your diabetes is not well controlled or if you are sick, you may need to monitor more often. HOW TO MONITOR YOUR BLOOD GLUCOSE Supplies Needed  Blood glucose meter.  Test strips for your meter. Each meter has its own strips. You must use the strips that go with your own meter.  A pricking  needle (lancet).  A device that holds the lancet (lancing device).  A journal or log book to write down your results. Procedure  Wash your hands with soap and water. Alcohol is not preferred.  Prick the side of your finger (not the tip) with the lancet.  Gently milk the finger until a small drop of blood appears.  Follow the instructions that come with your meter for inserting the test strip, applying blood to the strip, and using your blood glucose meter. Other Areas to Get Blood for Testing Some meters allow you to use other areas of your body (other than your finger) to test your blood. These areas are called alternative sites. The most common alternative sites are:  The forearm.  The thigh.  The back area of the lower leg.  The palm  of the hand. The blood flow in these areas is slower. Therefore, the blood glucose values you get may be delayed, and the numbers are different from what you would get from your fingers. Do not use alternative sites if you think you are having hypoglycemia. Your reading will not be accurate. Always use a finger if you are having hypoglycemia. Also, if you cannot feel your lows (hypoglycemia unawareness), always use your fingers for your blood glucose checks. ADDITIONAL TIPS FOR GLUCOSE MONITORING  Do not reuse lancets.  Always carry your supplies with you.  All blood glucose meters have a 24-hour "hotline" number to call if you have questions or need help.  Adjust (calibrate) your blood glucose meter with a control solution after finishing a few boxes of strips. BLOOD GLUCOSE RECORD KEEPING It is a good idea to keep a daily record or log of your blood glucose readings. Most glucose meters, if not all, keep your glucose records stored in the meter. Some meters come with the ability to download your records to your home computer. Keeping a record of your blood glucose readings is especially helpful if you are wanting to look for patterns. Make notes to go along with the blood glucose readings because you might forget what happened at that exact time. Keeping good records helps you and your health care provider to work together to achieve good diabetes management.    This information is not intended to replace advice given to you by your health care provider. Make sure you discuss any questions you have with your health care provider.   Document Released: 07/23/2003 Document Revised: 08/10/2014 Document Reviewed: 12/12/2012 Elsevier Interactive Patient Education 2016 [employer] Insulin Treatment for Diabetes Diabetes is a disease that does not go away (chronic). It occurs when the body does not properly use the sugar (glucose) that is released from food after it is digested. Glucose levels are  controlled by a hormone called insulin, which is made by your pancreas. Depending on the type of diabetes you have, either of the following will apply:   The pancreas does not make any insulin (type 1 diabetes).  The pancreas makes too little insulin, and the body cannot respond normally to the insulin that is made (type 2 diabetes). Without insulin, death can occur. However, with the addition of insulin, blood sugar monitoring, and treatment, someone with diabetes can live a full and productive life. This document will discuss the role of insulin in your treatment and provide information about its use.  HOW IS INSULIN GIVEN? Insulin is a medicine that can only be given by injection. Taking it by mouth makes it inactive because of the acid in  your stomach. Insulin is injected under the skin by a syringe and needle, an insulin pen, a pump, or a jet injector. Your dose will be determined by your health care provider based on your individual needs. You will also be given guidance on which method of giving insulin is right for you. Remember that if you give insulin with a needle and syringe, you must do so using only a special insulin syringe made for this purpose. WHERE ON THE BODY SHOULD INSULIN BE INJECTED? Insulin is injected into the fatty layer of tissue just under your skin. Good places to inject insulin include the upper arm, the front and outer area of the thigh, the hips, and the abdomen. Giving your insulin in the abdomen is preferred because this provides the most rapid and consistent absorption. Avoid the area 2 inches (5 cm) around the navel and avoid injecting into areas on your body with scar tissue. In addition, it is important to rotate your injection sites with every shot to prevent irritation and improve absorption. WHAT ARE THE DIFFERENT TYPES OF INSULIN?  If you have type 1 diabetes, you must take insulin to stay alive. Your body does not produce it. If you have type 2 diabetes, you  might require insulin in addition to, or instead of, other medicines. In either case, proper use of insulin is critical to control your diabetes.  There are a number of different types of insulin. Usually, you will give yourself injections, though others can be trained to give them to you. Some people have an insulin pump that delivers insulin continuously through a tube (cannula) that is placed under the skin. Using insulin requires that you check your blood sugar several times a day. The exact number of times and time of day to check will vary depending on your type of diabetes, your type of insulin, and treatment goals. Your health care provider will direct you.  Generally, different insulins have different properties. The following is a general guide. Specifics will vary by product, and new products are introduced periodically.   Rapid-acting insulin starts working quickly (in as little as 5 minutes) and wears off in 4 to 6 hours (sometimes longer). This type of insulin works well when taken just before a meal to bring your blood sugar quickly back to normal.   Short-acting insulin starts working in about 30 minutes and can last 6 to 10 hours. This type of insulin should be taken about 30 minutes before you start eating a meal.  Intermediate-acting insulin starts working in 1-2 hours and wears off after about 10 to 18 hours. This insulin will lower your blood sugar for a longer period of time, but it will not be as effective in lowering your blood sugar right after a meal.   Long-acting insulin mimics the small amount of insulin that your pancreas usually produces throughout the day. You need to have some insulin present at all times. It is crucial to the metabolism of brain cells and other cells. Long-acting insulin is meant to be used either once or twice a day. It is usually used in combination with other types of insulin, or in combination with other diabetes medicines.  Discuss the type of  insulin you are taking with your health care provider or pharmacist. You will then be aware of when the insulin can be expected to peak and when it will wear off. This is important to know so you can plan for meal times and periods of exercise.  Your health care provider will usually have a strategy in mind when treating you with insulin. This will vary with your type of diabetes, your diabetes treatment goals, and your health history. It is important that you understand this strategy so you can be an active partner in treating your diabetes. Here are some terms you might hear:   Basal insulin. This refers to the small amount of insulin that needs to be present in your blood at all times. Sometimes oral medicines will be enough. For other people, and especially for people with type 1 diabetes, insulin is needed. Usually, intermediate-acting or long-acting insulin is used once or twice a day to accomplish this.   Prandial (meal-related) insulin. Your blood sugar will rise rapidly after a meal. Rapid-acting or short-acting insulin can be used right before the meal to bring your blood sugar back to normal quickly. You might be instructed to adjust the amount of insulin depending on how much carbohydrate (starch) is in your meal.   Corrective insulin. You might be instructed to check your blood sugar at certain times of the day. You then might use a small amount of rapid-acting or short-acting insulin to bring the blood sugar down to normal if it is elevated.   Tight control (also called intensive therapy). Tight control means keeping your blood sugar as close to your target as possible and keeping it from going too high after meals. People with tight control of their diabetes are shown to have fewer long-term problems from their diabetes.   Glycohemoglobin (also called glyco, glycosylated hemoglobin, hemoglobin A1c, or A1c) level. This measures how well your blood sugar has been controlled during the  past 1 to 3 months. It helps your health care provider see how effective your treatment is and decide if any changes are needed. Your health care provider will discuss your target glycohemoglobin level with you.  Insulin treatment requires your careful attention. While you are being treated with insulin, you should check your blood glucose at least two times each day. Treatment plans will be different for different people. Some people do well with a simple program. Others require more complicated programs, with multiple insulin injections daily. You will work with your health care provider to develop the best program for you. Regardless of your insulin treatment plan, you must also do your best on weight control, diet and food choices, exercise, blood pressure control, cholesterol control, and stress levels.  WHAT ARE THE SIDE EFFECTS OF INSULIN? Although insulin treatment is important, it does have some side effects, such as:   Insulin can cause your blood sugar to go too low (hypoglycemia).   Weight gain can occur.   Improper injection technique can cause hypoglycemia, blood sugar to go too high (hyperglycemia), skin injury or irritation, or other problems. You must learn to inject insulin properly.   This information is not intended to replace advice given to you by your health care provider. Make sure you discuss any questions you have with your health care provider.   Document Released: 10/16/2008 Document Revised: 08/10/2014 Document Reviewed: 01/01/2013 Elsevier Interactive Patient Education [employer]

## 2015-07-31 ENCOUNTER — Telehealth: Payer: Self-pay | Admitting: Physician Assistant

## 2015-07-31 LAB — BASIC METABOLIC PANEL
BUN: 14 mg/dL (ref 7–25)
CALCIUM: 9.5 mg/dL (ref 8.6–10.3)
CO2: 26 mmol/L (ref 20–31)
Chloride: 91 mmol/L — ABNORMAL LOW (ref 98–110)
Creat: 1.36 mg/dL — ABNORMAL HIGH (ref 0.60–1.35)
GLUCOSE: 478 mg/dL — AB (ref 65–99)
Potassium: 4.2 mmol/L (ref 3.5–5.3)
SODIUM: 130 mmol/L — AB (ref 135–146)

## 2015-07-31 NOTE — Telephone Encounter (Signed)
Appears he was seen yesterday at urgent care?

## 2015-07-31 NOTE — Telephone Encounter (Signed)
[facility]

## 2015-07-31 NOTE — Telephone Encounter (Signed)
High Alert Glucose: 478.  Will route to Dr. Clelia CroftShaw. Patient was seen here yesterday with uncontrolled diabetes and started on insulin.

## 2015-08-01 NOTE — Telephone Encounter (Signed)
Expected, thanks 

## 2015-08-09 ENCOUNTER — Encounter: Payer: Self-pay | Admitting: Family Medicine

## 2015-08-09 ENCOUNTER — Ambulatory Visit (INDEPENDENT_AMBULATORY_CARE_PROVIDER_SITE_OTHER): Payer: 59 | Admitting: Family Medicine

## 2015-08-09 VITALS — BP 126/76 | HR 60 | Ht 72.75 in | Wt 281.4 lb

## 2015-08-09 DIAGNOSIS — K219 Gastro-esophageal reflux disease without esophagitis: Secondary | ICD-10-CM | POA: Diagnosis not present

## 2015-08-09 DIAGNOSIS — Z7189 Other specified counseling: Secondary | ICD-10-CM

## 2015-08-09 DIAGNOSIS — Z7689 Persons encountering health services in other specified circumstances: Secondary | ICD-10-CM

## 2015-08-09 DIAGNOSIS — IMO0001 Reserved for inherently not codable concepts without codable children: Secondary | ICD-10-CM

## 2015-08-09 DIAGNOSIS — E1165 Type 2 diabetes mellitus with hyperglycemia: Secondary | ICD-10-CM

## 2015-08-09 MED ORDER — DEXLANSOPRAZOLE 60 MG PO CPDR: 60.0000 mg | DELAYED_RELEASE_CAPSULE | Freq: Every day | ORAL | Status: DC

## 2015-08-09 MED FILL — TRUE METRIX GLUCOSE TEST ST: 25 days supply | Qty: 50 | Fill #0

## 2015-08-09 NOTE — Progress Notes (Signed)
   Subjective:    Patient ID: Roy Jennings, male    DOB: [birth year], 35 y.o.   MRN: [id number]  HPI Chief Complaint  Patient presents with  . new pt    new pt, diabetes.- went ot ER last week and a1c was 11.2. checking bs once daily    he is new to the practice and here to establish primary care. He is also here for a diabetes visit. He was having symptoms approximately one week ago, urinary frequency and headaches, and decided to go to the urgent care. They did blood work and discovered that his blood sugar was significantly elevated and his A1c was 11.2. He states he was diagnosed 2014 with Diabetes type 2. He states he was taking Metformin and insulin and his A1C was at 4.5 over a year ago and they took him off his medications. He did not take any medications for about one year.   States he is taking Lantus 10 units nightly at present and Metformin 1000 mg bid. He states he is having some mild diarrhea and stomach feeling funny since taking metformin.  He is currently checking his blood sugar once daily, no particular time, and yesterday his blood sugar in the evening was 190. Denies headaches, urinary frequency.  Denies fever, chills, body aches, fatigue, unexplained weight loss.   He works as Theatre managermental health counselor.  Denies smoking, drinking alcohol, or drug use.    Review of Systems  pertinent positives and negatives in the history of present illness.    Objective:   Physical Exam BP 126/76 mmHg  Pulse 60  Ht 6' 0.75" (1.848 m)  Wt 281 lb 6.4 oz (127.642 kg)  BMI 37.38 kg/m2   alert and oriented and in no acute distress. Not otherwise examined     Assessment & Plan:  Uncontrolled type 2 diabetes mellitus without complication, without long-term current use of insulin (HCC)  Encounter to establish care  Gastroesophageal reflux disease, esophagitis presence not specified - Plan: dexlansoprazole (DEXILANT) 60 MG capsule  Discussed diabetes spectrum and the importance of  getting his blood sugar under control. Discussed lifestyle modifications and watching his carbohydrates and being more physically active. He has been to nutritionist since his diagnosis 2 years ago. He has also had an eye exam in past 2 years. Recommend that he continue on Lantus 10 units daily and Metformin 1000 bid until he returns next week with his blood sugar readings.  He will check his blood sugar twice daily for the next week once in the morning fasting and another time 2 hours after a meal.  Will check his labs next week, mainly to see if his kidney function has improved since his visit to urgent care. He was most likely dehydrated when these labs were drawn since he was having urinary frequency at that time. Recommend that he is well hydrated when he returns for his next visit.  Discussed that he seems to be describing reflux symptoms with burning in the throat and upper chest after eating. Samples of Dexilant given and he will let me know if this is helping. Instructions given to eat small frequent meals and to avoid eating 3 hours prior to laying down. Also recommend that he keep an eye out for offending foods.

## 2015-08-09 NOTE — Patient Instructions (Addendum)
Check your blood sugars twice daily for the next week, once in the morning before breakfast and again later 2 hours after a meal. Bring these numbers in when you come back next week.  Try taking the Dexilant once daily 30 minutes before breakfast and see if this helps with the reflux symptoms.  Follow up next week for diabetes check.    Basic Carbohydrate Counting for Diabetes Mellitus Carbohydrate counting is a method for keeping track of the amount of carbohydrates you eat. Eating carbohydrates naturally increases the level of sugar (glucose) in your blood, so it is important for you to know the amount that is okay for you to have in every meal. Carbohydrate counting helps keep the level of glucose in your blood within normal limits. The amount of carbohydrates allowed is different for every person. A dietitian can help you calculate the amount that is right for you. Once you know the amount of carbohydrates you can have, you can count the carbohydrates in the foods you want to eat. Carbohydrates are found in the following foods:  Grains, such as breads and cereals.  Dried beans and soy products.  Starchy vegetables, such as potatoes, peas, and corn.  Fruit and fruit juices.  Milk and yogurt.  Sweets and snack foods, such as cake, cookies, candy, chips, soft drinks, and fruit drinks. CARBOHYDRATE COUNTING There are two ways to count the carbohydrates in your food. You can use either of the methods or a combination of both. Reading the "Nutrition Facts" on Packaged Food The "Nutrition Facts" is an area that is included on the labels of almost all packaged food and beverages in the [place]. It includes the serving size of that food or beverage and information about the nutrients in each serving of the food, including the grams (g) of carbohydrate per serving.  Decide the number of servings of this food or beverage that you will be able to eat or drink. Multiply that number of servings  by the number of grams of carbohydrate that is listed on the label for that serving. The total will be the amount of carbohydrates you will be having when you eat or drink this food or beverage. Learning Standard Serving Sizes of Food When you eat food that is not packaged or does not include "Nutrition Facts" on the label, you need to measure the servings in order to count the amount of carbohydrates.A serving of most carbohydrate-rich foods contains about 15 g of carbohydrates. The following list includes serving sizes of carbohydrate-rich foods that provide 15 g ofcarbohydrate per serving:   1 slice of bread (1 oz) or 1 six-inch tortilla.    of a hamburger bun or English muffin.  4-6 crackers.   cup unsweetened dry cereal.    cup hot cereal.   cup rice or pasta.    cup mashed potatoes or  of a large baked potato.  1 cup fresh fruit or one small piece of fruit.    cup canned or frozen fruit or fruit juice.  1 cup milk.   cup plain fat-free yogurt or yogurt sweetened with artificial sweeteners.   cup cooked dried beans or starchy vegetable, such as peas, corn, or potatoes.  Decide the number of standard-size servings that you will eat. Multiply that number of servings by 15 (the grams of carbohydrates in that serving). For example, if you eat 2 cups of strawberries, you will have eaten 2 servings and 30 g of carbohydrates (2 servings x 15  g = 30 g). For foods such as soups and casseroles, in which more than one food is mixed in, you will need to count the carbohydrates in each food that is included. EXAMPLE OF CARBOHYDRATE COUNTING Sample Dinner  3 oz chicken breast.   cup of brown rice.   cup of corn.  1 cup milk.   1 cup strawberries with sugar-free whipped topping.  Carbohydrate Calculation Step 1: Identify the foods that contain carbohydrates:   Rice.   Corn.   Milk.   Strawberries. Step 2:Calculate the number of servings eaten of each:    2 servings of rice.   1 serving of corn.   1 serving of milk.   1 serving of strawberries. Step 3: Multiply each of those number of servings by 15 g:   2 servings of rice x 15 g = 30 g.   1 serving of corn x 15 g = 15 g.   1 serving of milk x 15 g = 15 g.   1 serving of strawberries x 15 g = 15 g. Step 4: Add together all of the amounts to find the total grams of carbohydrates eaten: 30 g + 15 g + 15 g + 15 g = 75 g.   This information is not intended to replace advice given to you by your health care provider. Make sure you discuss any questions you have with your health care provider.   Document Released: 07/20/2005 Document Revised: 08/10/2014 Document Reviewed: 06/16/2013 Elsevier Interactive Patient Education [employer]

## 2015-08-14 ENCOUNTER — Ambulatory Visit (INDEPENDENT_AMBULATORY_CARE_PROVIDER_SITE_OTHER): Payer: 59 | Admitting: Family Medicine

## 2015-08-14 VITALS — BP 126/74 | HR 76 | Wt 283.0 lb

## 2015-08-14 DIAGNOSIS — R7309 Other abnormal glucose: Secondary | ICD-10-CM

## 2015-08-14 DIAGNOSIS — R7989 Other specified abnormal findings of blood chemistry: Secondary | ICD-10-CM | POA: Diagnosis not present

## 2015-08-14 DIAGNOSIS — E1165 Type 2 diabetes mellitus with hyperglycemia: Secondary | ICD-10-CM

## 2015-08-14 DIAGNOSIS — E669 Obesity, unspecified: Secondary | ICD-10-CM | POA: Diagnosis not present

## 2015-08-14 DIAGNOSIS — R748 Abnormal levels of other serum enzymes: Secondary | ICD-10-CM | POA: Diagnosis not present

## 2015-08-14 DIAGNOSIS — E119 Type 2 diabetes mellitus without complications: Secondary | ICD-10-CM | POA: Diagnosis not present

## 2015-08-14 DIAGNOSIS — R81 Glycosuria: Secondary | ICD-10-CM

## 2015-08-14 DIAGNOSIS — R809 Proteinuria, unspecified: Secondary | ICD-10-CM

## 2015-08-14 DIAGNOSIS — IMO0001 Reserved for inherently not codable concepts without codable children: Secondary | ICD-10-CM | POA: Insufficient documentation

## 2015-08-14 LAB — POCT URINALYSIS DIPSTICK
BILIRUBIN UA: NEGATIVE
Ketones, UA: NEGATIVE
Leukocytes, UA: NEGATIVE
Nitrite, UA: NEGATIVE
Protein, UA: NEGATIVE
RBC UA: NEGATIVE
SPEC GRAV UA: 1.025
Urobilinogen, UA: NEGATIVE
pH, UA: 6

## 2015-08-14 MED ORDER — INSULIN GLARGINE 100 UNIT/ML ~~LOC~~ SOLN: SUBCUTANEOUS | Status: DC

## 2015-08-14 MED FILL — LANTUS 100 UNITS/ML VIAL: 100 | 28 days supply | Qty: 10 | Fill #0

## 2015-08-14 NOTE — Patient Instructions (Signed)
Take 12 units of Lantus tonight and for the next 3 nights then if your blood sugar is not less than 120, increase the Lantus dose by 2 units. So on Saturday evening if your blood sugar is still greater than 120 when you wake up you will increase to 14 units and then 3 days later you would increase to 16 units if your blood sugar is not less than 120. I am referring you to a endocrinologist based on your complicated history of diabetes.  Since the reflux medication seemed to help, try taking over-the-counter reflux medication and do the things we talked about to prevent reflux.

## 2015-08-14 NOTE — Progress Notes (Signed)
   Subjective:    Patient ID: Roy Jennings, male    DOB: [birth year], 35 y.o.   MRN: [id number]  HPI Chief Complaint  Patient presents with  . diabetic check    follow-up bs ranging 194-260.    He is here for follow up on diabetes and elevated serum creatinine and proteinuria. He states he was diagnosed with diabetes proximally 2.5 -3 years ago and was taking Metformin and Lantus to control his blood sugar. States he was told approximate one year ago that he no longer had diabetes and was taken off his medications.  He reports taking lantus 10 units nightly and taking Metformin 1000 mg at least once daily in morning and states he occasionally forgets to take his evening dose. He has only been checking his blood sugar in the each morning, fasting and his readings have been between 194-260. He has not been checking post prandial blood, he states he does not like needles.  He has been non-discriminatory with his diet. Does not exercise. He is aware that his serum creatinine was elevated at his urgent care visit a few days ago. He states he is well hydrated today and plan to recheck his kidney function.  He reports significant improvement with reflux symptoms since taking Dexilant samples.  Denies polyuria, polydipsia, polyphagia, headache, chest pain, palpitations, DOE, GI or GU symptoms.   Reviewed allergies, medications, past medical history.   Review of Systems Pertinent positives and negatives in the history of present illness.    Objective:   Physical Exam BP 126/74 mmHg  Pulse 76  Wt 283 lb (128.368 kg)  Alert and in no distress.  Cardiac exam shows a regular sinus rhythm without murmurs or gallops. Lungs are clear to auscultation.  Urinalysis dipstick 3+ glucose, no protein or ketones    Assessment & Plan:  Uncontrolled diabetes mellitus type 2 without complications, unspecified long term insulin use status (HCC) - Plan: Ambulatory referral to Endocrinology, COMPLETE METABOLIC  PANEL WITH GFR, POCT urinalysis dipstick, CANCELED: COMPLETE METABOLIC PANEL WITH GFR  Obesity  Elevated serum creatinine - Plan: COMPLETE METABOLIC PANEL WITH GFR, POCT urinalysis dipstick, CANCELED: COMPLETE METABOLIC PANEL WITH GFR  Elevated serum glucose with glucosuria - Plan: POCT urinalysis dipstick  Proteinuria  Discussed that his blood sugar is still way out of goal range. Referral made to endocrinologist due to his complicated history of diabetes.  Reviewing his records, his previous provider was concerned that patient may have mixed diabetes and recommended that he see a specialist.  In the meantime, he will increase his Lantus from 10 units to 12 units starting this evening and if his blood sugar is not <120 fasting he will increase his dose by 2 units every 3 days until his blood sugar is <120. Lantus prescription sent to pharmacy. Recommend setting alarm or doing something to remind him to take his evening dose of metformin. Recommend he watch his carb and simple sugars by reducing portion sizes and try to include more protein.  Urinalysis dipstick does show that he is continuing to spill glucose however no protein or ketones.   Recommend he return for a physical exam and fasting blood work. He does have a history of elevated lipids. He is not fasting today.

## 2015-08-15 LAB — COMPLETE METABOLIC PANEL WITH GFR
ALT: 34 U/L (ref 9–46)
AST: 17 U/L (ref 10–40)
Albumin: 4.2 g/dL (ref 3.6–5.1)
Alkaline Phosphatase: 93 U/L (ref 40–115)
BILIRUBIN TOTAL: 0.4 mg/dL (ref 0.2–1.2)
BUN: 9 mg/dL (ref 7–25)
CHLORIDE: 96 mmol/L — AB (ref 98–110)
CO2: 28 mmol/L (ref 20–31)
Calcium: 9.2 mg/dL (ref 8.6–10.3)
Creat: 1.14 mg/dL (ref 0.60–1.35)
GFR, EST NON AFRICAN AMERICAN: 83 mL/min (ref >=60)
GLUCOSE: 337 mg/dL — AB (ref 65–99)
POTASSIUM: 4.4 mmol/L (ref 3.5–5.3)
SODIUM: 133 mmol/L — AB (ref 135–146)
Total Protein: 6.8 g/dL (ref 6.1–8.1)

## 2015-08-16 ENCOUNTER — Ambulatory Visit: Payer: 59 | Admitting: Family Medicine

## 2015-08-28 ENCOUNTER — Ambulatory Visit (INDEPENDENT_AMBULATORY_CARE_PROVIDER_SITE_OTHER): Payer: 59 | Admitting: Family Medicine

## 2015-08-28 ENCOUNTER — Encounter: Payer: Self-pay | Admitting: Family Medicine

## 2015-08-28 VITALS — BP 128/72 | HR 84 | Ht 71.0 in | Wt 278.0 lb

## 2015-08-28 DIAGNOSIS — E1165 Type 2 diabetes mellitus with hyperglycemia: Secondary | ICD-10-CM | POA: Diagnosis not present

## 2015-08-28 DIAGNOSIS — IMO0001 Reserved for inherently not codable concepts without codable children: Secondary | ICD-10-CM

## 2015-08-28 DIAGNOSIS — E669 Obesity, unspecified: Secondary | ICD-10-CM

## 2015-08-28 NOTE — Progress Notes (Signed)
   Subjective:    Patient ID: Roy Jennings, male    DOB: 12-22-1980, 35 y.o.   MRN: [id number]  HPI Chief Complaint  Patient presents with  . Follow-up    2 week follow up on blood sugars, non fasting.    He is here for follow up on blood sugars. He is currently taking Lantus 14 units and Metformin twice daily.  States fasting blood sugar today was 153 and the highest was 240. Reports fluctuations in blood sugars and he has forgotten to take the Lantus one night since his last visit.  He started walking 30 minutes 2-3 days per week. He is eating grilled chicken but also having pasta and sugary drinks.  Denies polyuria, polydipsia. States he feels fine.    Review of Systems Pertinent positives and negatives in history of present illness.    Objective:   Physical Exam BP 128/72 mmHg  Pulse 84  Ht  (1.803 m)  Wt 278 lb (126.1 kg)  BMI 38.79 kg/m2  Alert and oriented and in no acute distress. Not otherwise examined     Assessment & Plan:  Uncontrolled type 2 diabetes mellitus without complication, without long-term current use of insulin (HCC)  Obesity  Discussed that he should increase his Lantus dose to 16 units tonight and then if his FBG is still >120 Friday morning to increase to 18 units Friday night. Recommend that he continue watching his carbohydrates and sugar intake. He should minimize sugary drinks, he has been drinking ginger ale, sweet tea, and apple juice. Education provided but he needs additional education on nutrition. Also recommend that he walks 30 minutes as many days as possible. He has lost almost 5 lbs since his last visit 2 weeks ago. He has an appointment with endocrinologist on January 30 and I will appreciate their input on his disease.

## 2015-08-28 NOTE — Patient Instructions (Signed)
Start using Lantus 16 units tonight and increase by 2 units every third night until your fasting blood sugar is <120. Continue walking 30 minutes per day as many days as you can.   Basic Carbohydrate Counting for Diabetes Mellitus Carbohydrate counting is a method for keeping track of the amount of carbohydrates you eat. Eating carbohydrates naturally increases the level of sugar (glucose) in your blood, so it is important for you to know the amount that is okay for you to have in every meal. Carbohydrate counting helps keep the level of glucose in your blood within normal limits. The amount of carbohydrates allowed is different for every person. A dietitian can help you calculate the amount that is right for you. Once you know the amount of carbohydrates you can have, you can count the carbohydrates in the foods you want to eat. Carbohydrates are found in the following foods:  Grains, such as breads and cereals.  Dried beans and soy products.  Starchy vegetables, such as potatoes, peas, and corn.  Fruit and fruit juices.  Milk and yogurt.  Sweets and snack foods, such as cake, cookies, candy, chips, soft drinks, and fruit drinks. CARBOHYDRATE COUNTING There are two ways to count the carbohydrates in your food. You can use either of the methods or a combination of both. Reading the "Nutrition Facts" on Packaged Food The "Nutrition Facts" is an area that is included on the labels of almost all packaged food and beverages in the [place]. It includes the serving size of that food or beverage and information about the nutrients in each serving of the food, including the grams (g) of carbohydrate per serving.  Decide the number of servings of this food or beverage that you will be able to eat or drink. Multiply that number of servings by the number of grams of carbohydrate that is listed on the label for that serving. The total will be the amount of carbohydrates you will be having when you eat  or drink this food or beverage. Learning Standard Serving Sizes of Food When you eat food that is not packaged or does not include "Nutrition Facts" on the label, you need to measure the servings in order to count the amount of carbohydrates.A serving of most carbohydrate-rich foods contains about 15 g of carbohydrates. The following list includes serving sizes of carbohydrate-rich foods that provide 15 g ofcarbohydrate per serving:   1 slice of bread (1 oz) or 1 six-inch tortilla.    of a hamburger bun or English muffin.  4-6 crackers.   cup unsweetened dry cereal.    cup hot cereal.   cup rice or pasta.    cup mashed potatoes or  of a large baked potato.  1 cup fresh fruit or one small piece of fruit.    cup canned or frozen fruit or fruit juice.  1 cup milk.   cup plain fat-free yogurt or yogurt sweetened with artificial sweeteners.   cup cooked dried beans or starchy vegetable, such as peas, corn, or potatoes.  Decide the number of standard-size servings that you will eat. Multiply that number of servings by 15 (the grams of carbohydrates in that serving). For example, if you eat 2 cups of strawberries, you will have eaten 2 servings and 30 g of carbohydrates (2 servings x 15 g = 30 g). For foods such as soups and casseroles, in which more than one food is mixed in, you will need to count the carbohydrates in each food  that is included. EXAMPLE OF CARBOHYDRATE COUNTING Sample Dinner  3 oz chicken breast.   cup of brown rice.   cup of corn.  1 cup milk.   1 cup strawberries with sugar-free whipped topping.  Carbohydrate Calculation Step 1: Identify the foods that contain carbohydrates:   Rice.   Corn.   Milk.   Strawberries. Step 2:Calculate the number of servings eaten of each:   2 servings of rice.   1 serving of corn.   1 serving of milk.   1 serving of strawberries. Step 3: Multiply each of those number of servings by 15 g:    2 servings of rice x 15 g = 30 g.   1 serving of corn x 15 g = 15 g.   1 serving of milk x 15 g = 15 g.   1 serving of strawberries x 15 g = 15 g. Step 4: Add together all of the amounts to find the total grams of carbohydrates eaten: 30 g + 15 g + 15 g + 15 g = 75 g.   This information is not intended to replace advice given to you by your health care provider. Make sure you discuss any questions you have with your health care provider.   Document Released: 07/20/2005 Document Revised: 08/10/2014 Document Reviewed: 06/16/2013 Elsevier Interactive Patient Education [employer]

## 2015-09-02 ENCOUNTER — Encounter: Payer: Self-pay | Admitting: Endocrinology

## 2015-09-02 ENCOUNTER — Ambulatory Visit (INDEPENDENT_AMBULATORY_CARE_PROVIDER_SITE_OTHER): Payer: 59 | Admitting: Endocrinology

## 2015-09-02 VITALS — BP 136/84 | HR 96 | Temp 97.9°F | Ht 72.75 in | Wt 281.0 lb

## 2015-09-02 DIAGNOSIS — E109 Type 1 diabetes mellitus without complications: Secondary | ICD-10-CM | POA: Diagnosis not present

## 2015-09-02 DIAGNOSIS — E119 Type 2 diabetes mellitus without complications: Secondary | ICD-10-CM | POA: Insufficient documentation

## 2015-09-02 MED ORDER — INSULIN GLARGINE 100 UNIT/ML SOLOSTAR PEN: 30.0000 [IU] | PEN_INJECTOR | Freq: Every day | SUBCUTANEOUS | Status: DC

## 2015-09-02 NOTE — Patient Instructions (Addendum)
good diet and exercise significantly improve the control of your diabetes.  please let me know if you wish to be referred to a dietician.  high blood sugar is very risky to your health.  you should see an eye doctor and dentist every year.  It is very important to get all recommended vaccinations.  controlling your blood pressure and cholesterol drastically reduces the damage diabetes does to your body.  Those who smoke should quit.  please discuss these with your doctor.  check your blood sugar twice a day.  vary the time of day when you check, between before the 3 meals, and at bedtime.  also check if you have symptoms of your blood sugar being too high or too low.  please keep a record of the readings and bring it to your next appointment here (or you can bring the meter itself).  You can write it on any piece of paper.  please call us sooner if your blood sugar goes below 70, or if you have a lot of readings over 200. The next step is to increase the insulin up to the amount you need.  For now, please increase the lantus to 30 units daily.  Please call in a few days, to tell us how the blood sugar is doing.   You can stop taking the metformin.  Please come back for a follow-up appointment in 2 weeks.  We'll refine the insulin schedule next time.

## 2015-09-02 NOTE — Progress Notes (Signed)
Subjective:    Patient ID: Roy Jennings, male    DOB: 1981/03/28, 35 y.o.   MRN: [id number]  HPI pt states DM was dx'ed in 2013, when he presented with nonketotic hyperosmolar hyperglycemic state, ketonuria, but no acodosis; he has mild if any neuropathy of the lower extremities, but he has associated renal insufficiency; he has been back on insulin since late 2016; pt says his diet and exercise are improved recently; he has never had pancreatitis, DKA, or severe hypoglycemia.  He has titrated lantus to 18 units qd.  He says cbg's are still in the 200's.  Past Medical History  Diagnosis Date  . DKA (diabetic ketoacidoses) (HCC) 12/30/11    new dx DM a1c 13  . Diabetes mellitus 12/30/11  . 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 Right bundle branch block     Past Surgical History  Procedure Laterality Date  . Knee arthroscopy  1999-2000    bilateral    Social History   Social History  . Marital Status: Married    Spouse Name: N/A  . Number of Children: 1  . Years of Education: BS   Occupational History  .      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 Smoking status: Never Smoker   . Smokeless tobacco: Never Used  . Alcohol Use: 0.0 oz/week    0 Standard drinks or equivalent per week     Comment: Wine/beer every 1-2 weeks  . Drug Use: No  . Sexual Activity: Yes   Other Topics Concern  . Not on file   Social History Narrative   Married. Son '08. 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 aspirin EC 81 MG tablet Take 81 mg by mouth daily. Reported on 09/02/2015     No current facility-administered medications on file prior to visit.    No Known  Allergies  Family History  Problem Relation Age of Onset  . Diabetes Father   . Diabetes Maternal Grandmother     BP 136/84 mmHg  Pulse 96  Temp(Src) 97.9 F (36.6 C) (Oral)  Ht 6' 0.75" (1.848 m)  Wt 281 lb (127.461 kg)  BMI 37.32 kg/m2  SpO2 94%  Review of Systems denies weight loss, blurry vision, chest pain, sob, n/v, muscle cramps, excessive diaphoresis, depression, cold intolerance, rhinorrhea, and easy bruising.  He has gained a few lbs.  He has slight headache and urinary frequency.      Objective:   Physical Exam VS: see vs page GEN: no distress HEAD: head: no deformity eyes: no periorbital swelling, no proptosis external nose and ears are normal mouth: no lesion seen NECK: supple, thyroid is not enlarged CHEST WALL: no deformity LUNGS:  Clear to auscultation CV: reg rate and rhythm, no murmur ABD: abdomen is soft, nontender.  no hepatosplenomegaly.  not distended.  no hernia MUSCULOSKELETAL: muscle bulk and strength are grossly normal.  no obvious joint swelling.  gait is normal and steady EXTEMITIES: no deformity.  no ulcer on the feet.  feet are of normal color and temp.  no edema PULSES: dorsalis pedis intact bilat.  no carotid bruit NEURO:  cn 2-12 grossly intact.   readily moves all 4's.  sensation is intact to touch on the feet SKIN:  Normal texture and temperature.  No rash or suspicious lesion is visible.   NODES:  None palpable at the neck PSYCH: alert, well-oriented.  Does not appear anxious nor depressed.  I have reviewed outside records, and summarized: Pt was noted to have severely elevated a1c, and referred here.   Lab Results  Component Value Date   HGBA1C 11.2 07/30/2015   i personally reviewed electrocardiogram tracing (01/03/12): Indication: dizziness Impression: ST, RBBB    Assessment & Plan:  DM, prob type 2: severe exacerbation Obesity: new to me: He declines surgery  Patient is advised the following: Patient Instructions  good  diet and exercise significantly improve the control of your diabetes.  please let me know if you wish to be referred to a dietician.  high blood sugar is very risky to your health.  you should see an eye doctor and dentist every year.  It is very important to get all recommended vaccinations.  controlling your blood pressure and cholesterol drastically reduces the damage diabetes does to your body.  Those who smoke should quit.  please discuss these with your doctor.  check your blood sugar twice a day.  vary the time of day when you check, between before the 3 meals, and at bedtime.  also check if you have symptoms of your blood sugar being too high or too low.  please keep a record of the readings and bring it to your next appointment here (or you can bring the meter itself).  You can write it on any piece of paper.  please call us sooner if your blood sugar goes below 70, or if you have a lot of readings over 200. The next step is to increase the insulin up to the amount you need.  For now, please increase the lantus to 30 units daily.  Please call in a few days, to tell us how the blood sugar is doing.   You can stop taking the metformin.  Please come back for a follow-up appointment in 2 weeks.  We'll refine the insulin schedule next time.

## 2015-09-23 ENCOUNTER — Ambulatory Visit: Payer: 59 | Admitting: Endocrinology

## 2015-09-25 ENCOUNTER — Encounter: Payer: Self-pay | Admitting: Family Medicine

## 2015-09-25 ENCOUNTER — Ambulatory Visit (INDEPENDENT_AMBULATORY_CARE_PROVIDER_SITE_OTHER): Payer: 59 | Admitting: Family Medicine

## 2015-09-25 VITALS — BP 126/80 | HR 68 | Wt 285.6 lb

## 2015-09-25 DIAGNOSIS — IMO0001 Reserved for inherently not codable concepts without codable children: Secondary | ICD-10-CM

## 2015-09-25 DIAGNOSIS — E1165 Type 2 diabetes mellitus with hyperglycemia: Secondary | ICD-10-CM

## 2015-09-25 DIAGNOSIS — E119 Type 2 diabetes mellitus without complications: Secondary | ICD-10-CM | POA: Diagnosis not present

## 2015-09-25 MED ORDER — INSULIN GLARGINE 100 UNIT/ML SOLOSTAR PEN
30.0000 [IU] | PEN_INJECTOR | Freq: Every day | SUBCUTANEOUS | Status: DC
Start: 2015-09-25 — End: 2016-01-06

## 2015-09-25 NOTE — Progress Notes (Signed)
   Subjective:    Patient ID: Roy Jennings, male    DOB: [birth year], 35 y.o.   MRN: [id number]  HPI Chief Complaint  Patient presents with  . follow-up    1 month follow-up   He is here to follow-up on diabetes. He saw Dr. Everardo All on 09/02/2015 and states he was told that he was possibly type 1 or maybe mixed type 1 and 2 diabetes. He has stopped taking metformin and is currently taking Lantus 30 units nightly. He reports his fasting blood sugars are in the 180s and 200s. He states he made the appointment with the dietitian. He states he does not plan to return to the endocrinologist as he did not have a pleasant experience. He denies polyuria, polyphagia, polydipsia.   He states he has been drinking more water and trying to watch his diet. He has also been walking more.    Review of Systems Pertinent positives and negatives in the history of present illness.     Objective:   Physical Exam BP 126/80 mmHg  Pulse 68  Wt 285 lb 9.6 oz (129.547 kg)  Alert and oriented in no acute distress. Not otherwise examined.      Assessment & Plan:  Uncontrolled type 2 diabetes mellitus without complication, without long-term current use of insulin (HCC) - Plan: C-peptide  Discussed that his fasting blood sugars are still too high.  States patient with Dr. Susann Givens and agree that patient should start taking metformin twice daily again.  Will also consider starting him on an SG LT 2 such as Invokana or Jardiance. Will check and see which one his insurance will cover. He meets criteria for this since his kidney function is good and he is circumcised. Will consider referring him to a different endocrinologist if his blood sugars cannot be normalized.  Recommend that he check his blood sugars twice daily once fasting and 2 hours after lunch or supper. He will keep a log and return with his blood sugar readings.

## 2015-09-26 ENCOUNTER — Ambulatory Visit: Payer: 59 | Admitting: Dietician

## 2015-09-26 LAB — C-PEPTIDE: C PEPTIDE: 7.96 ng/mL — AB (ref 0.80–3.85)

## 2015-10-01 ENCOUNTER — Encounter: Payer: Self-pay | Admitting: *Deleted

## 2015-10-01 ENCOUNTER — Encounter: Payer: 59 | Attending: Family Medicine | Admitting: *Deleted

## 2015-10-01 VITALS — Ht 72.0 in | Wt 281.0 lb

## 2015-10-01 DIAGNOSIS — E109 Type 1 diabetes mellitus without complications: Secondary | ICD-10-CM | POA: Diagnosis not present

## 2015-10-01 DIAGNOSIS — E119 Type 2 diabetes mellitus without complications: Secondary | ICD-10-CM

## 2015-10-01 NOTE — Patient Instructions (Signed)
Plan:  Aim for 3-4 Carb Choices per meal (45-60 grams) +/- 1 either way  Aim for 0-15 Carbs per snack if hungry  Include protein in moderation with your meals and snacks Consider reading food labels for Total Carbohydrate and Fat Grams of foods Consider  increasing your activity level by walking for 30 minutes daily as tolerated Continue checking BG at alternate times per day to include fasting and 2 hours after a mealas directed by MD  Continue taking medication as directed by MD  Stay away from fruit juice  Baby Steps - Realistic expectations  Come back to see me either in class or 1:1

## 2015-10-05 NOTE — Progress Notes (Signed)
Diabetes Self-Management Education  Visit Type: First/Initial  Appt. Start Time: 1530 Appt. End Time: 1700  10/05/2015  [name], identified by name and date of birth, is a 35 y.o. male with a diagnosis of Diabetes: Type 2. Roy Jennings is accompanied by his wife Roy Jennings for continued education of dietary needs related to diabetes management. She notes that he has seen a dietitian in the past.  ASSESSMENT  Height 6' (1.829 m), weight 281 lb (127.461 kg). Body mass index is 38.1 kg/(m^2).      Diabetes Self-Management Education - 10/01/15 1602    Visit Information   Visit Type First/Initial   Initial Visit   Diabetes Type Type 2   Are you currently following a meal plan? No   Are you taking your medications as prescribed? Yes   Health Coping   How would you rate your overall health? Good   Psychosocial Assessment   Patient Belief/Attitude about Diabetes Defeat/Burnout   Self-care barriers None   Self-management support Doctor's office;Family;CDE visits   Other persons present Patient;Spouse/SO  [facility]   Patient Concerns Nutrition/Meal planning;Medication;Monitoring;Healthy Lifestyle;Problem Solving;Glycemic Control;Weight Control   Special Needs None   Preferred Learning Style No preference indicated   Learning Readiness Change in progress   How often do you need to have someone help you when you read instructions, pamphlets, or other written materials from your doctor or pharmacy? 2 - Rarely   What is the last grade level you completed in school? [employer]   Complications   Last HgB A1C per patient/outside source 11 %   How often do you check your blood sugar? 1-2 times/day   Fasting Blood glucose range (mg/dL) [phone]  been on a 3 day fast except for dinner   Postprandial Blood glucose range (mg/dL) [phone]   Have you had a dilated eye exam in the past 12 months? Yes   Have you had a dental exam in the past 12 months? Yes   Are you checking your feet? Yes    How many days per week are you checking your feet? 1   Dietary Intake   Breakfast none/ grits 1/2C, sausage, eggs /pancake 2 6", sausage, egg   Lunch frozen beef & brocolli chinese meal  pop in microwave   Snack (afternoon) fruit   Dinner hamburger helper, cabbage, toast / cream chicken & rice / baked fried chicken / ziti / macaroni & cheese /  salade ( greens, bacon, tomatoes, cucumber, chicken,   Snack (evening) chips, pop corn, crackers   Beverage(s) water, hot tea & honey,    Exercise   Exercise Type Light (walking / raking leaves)   How many days per week to you exercise? 3   How many minutes per day do you exercise? 30   Total minutes per week of exercise 90   Patient Education   Previous Diabetes Education Yes (please comment)  2013   Nutrition management  Role of diet in the treatment of diabetes and the relationship between the three main macronutrients and blood glucose level;Food label reading, portion sizes and measuring food.;Carbohydrate counting;Information on hints to eating out and maintain blood glucose control.   Physical activity and exercise  Role of exercise on diabetes management, blood pressure control and cardiac health.   Medications Reviewed patients medication for diabetes, action, purpose, timing of dose and side effects.   Monitoring Purpose and frequency of SMBG.   Chronic complications Relationship between chronic complications and blood glucose control  Individualized Goals (developed by patient)   Nutrition General guidelines for healthy choices and portions discussed   Physical Activity Exercise 3-5 times per week;30 minutes per day   Medications take my medication as prescribed   Outcomes   Expected Outcomes Demonstrated interest in learning. Expect positive outcomes   Future DMSE PRN   Program Status Completed      Individualized Plan for Diabetes Self-Management Training:   Learning Objective:  Patient will have a greater understanding of  diabetes self-management. Patient education plan is to attend individual and/or group sessions per assessed needs and concerns.   Plan:   Patient Instructions  Plan:  Aim for 3-4 Carb Choices per meal (45-60 grams) +/- 1 either way  Aim for 0-15 Carbs per snack if hungry  Include protein in moderation with your meals and snacks Consider reading food labels for Total Carbohydrate and Fat Grams of foods Consider  increasing your activity level by walking for 30 minutes daily as tolerated Continue checking BG at alternate times per day to include fasting and 2 hours after a mealas directed by MD  Continue taking medication as directed by MD  Stay away from fruit juice  Baby Steps - Realistic expectations  Come back to see me either in class or 1:1   Expected Outcomes:  Demonstrated interest in learning. Expect positive outcomes  Education material provided: Living Well with Diabetes, A1C conversion sheet, Meal plan card, My Plate, Snack sheet and Support group flyer  If problems or questions, patient to contact team via:  Phone  Future DSME appointment: PRN

## 2015-10-14 DIAGNOSIS — Z0100 Encounter for examination of eyes and vision without abnormal findings: Secondary | ICD-10-CM | POA: Diagnosis not present

## 2015-10-16 MED FILL — TRUE METRIX GLUCOSE TEST ST: 25 days supply | Qty: 50 | Fill #1

## 2015-11-08 ENCOUNTER — Ambulatory Visit: Payer: Self-pay

## 2015-11-11 MED FILL — LANTUS SOLOSTAR 100 UNITS/M: 100 | 30 days supply | Qty: 9 | Fill #0

## 2015-11-11 MED FILL — UNIFINE PENTIPS 31GX3/16: 31G X 5 MM | 90 days supply | Qty: 100 | Fill #0

## 2015-11-25 ENCOUNTER — Other Ambulatory Visit: Payer: Self-pay

## 2015-11-25 VITALS — BP 130/88 | HR 98 | Resp 16 | Ht 71.0 in | Wt 291.8 lb

## 2015-11-25 DIAGNOSIS — IMO0001 Reserved for inherently not codable concepts without codable children: Secondary | ICD-10-CM

## 2015-11-25 DIAGNOSIS — E1165 Type 2 diabetes mellitus with hyperglycemia: Principal | ICD-10-CM

## 2015-11-25 NOTE — Patient Outreach (Signed)
[facility]) Care Management   11/25/2015  Roy FewJames R Chandler May 13, 1981 [id number]  Roy FewJames R Jennings is an 35 y.o. male.  Member seen for initial office visit for Link to Wellness program for self management of Type 2 diabetes  Subjective: Member states that he has been trying to eat better and exercise more but his blood sugars are still up.  States he has higher blood sugars in the morning.  States he does not have an appointment with his provider at this time.  States he saw an endocrinologist but he did not like him and he does not plan to go back to him.  States his blood sugars range in the 200s in the morning and around 160-200 after meals.  States he is exercising 5-6 times a week and he averages 7000-8000 steps a day.  States that he wants to lose weight but he is not getting any results at this time.    Objective:   Review of Systems  All other systems reviewed and are negative.   Physical Exam Today's Vitals   11/25/15 1444  BP: 130/88  Pulse: 98  Resp: 16  Height: 1.803 m (5\' 11" )  Weight: 291 lb 12.8 oz (132.36 kg)  SpO2: 96%  PainSc: 0-No pain   Encounter Medications:   Outpatient Encounter Prescriptions as of 11/25/2015  Medication Sig Note  . aspirin EC 81 MG tablet Take 81 mg by mouth daily. Reported on 10/01/2015   . Insulin Glargine (LANTUS SOLOSTAR) 100 UNIT/ML Solostar Pen Inject 30 Units into the skin daily. And pen needles 1/day   . metFORMIN (GLUCOPHAGE) 1000 MG tablet Take 1,000 mg by mouth 2 (two) times daily with a meal. 09/25/2015: Received from: External Pharmacy  . Insulin Glargine (LANTUS SOLOSTAR) 100 UNIT/ML Solostar Pen Inject 30 Units into the skin daily at 10 pm. (Patient not taking: Reported on 11/25/2015)    No facility-administered encounter medications on file as of 11/25/2015.    Functional Status:   In your present state of health, do you have any difficulty performing the following activities: 11/25/2015  Hearing? N  Vision? N   Difficulty concentrating or making decisions? N  Walking or climbing stairs? N  Dressing or bathing? N  Doing errands, shopping? N    Fall/Depression Screening:    PHQ 2/9 Scores 11/25/2015 10/05/2015 07/30/2015 06/18/2014 02/20/2014 01/01/2014 10/30/2013  PHQ - 2 Score 0 0 0 0 0 0 0    Assessment:  Member seen for initial office visit for Link to Wellness program for self management of Type 2 diabetes.  Member is not meeting diabetes self management goal of hemoglobin A1C of 7% or below with last reading of 11.2.  Member now back on Metformin and Lantus insulin.  Reports increased exercise 5-6 times a week.  Member struggles with CHO counting and watching portions.  Member is up to date with annual eye exam.   Plan:  Plan to eat 45-60 GM (3-4) servings of carbohydrate a meal and 15 GM for snacks.  Plan to eat protein with your snacks Plan to check blood sugar daily either fasting or 1 -2hrs after a meal.  Goals of 80-130 fasting and 180 or less after eating. Plan to try My Fitness Pal or Calorie Brooke DareKing applications to track carbohydrates Plan to exercise 5-6 times a week for 30 minutes.   Plan to complete EMMI programs by 01/02/16 Plan to call provider to schedule a follow up appointment Plan to return to Link to  Wellness on 01/06/16 at [facility] Care Plan Problem One        Most Recent Value   Care Plan Problem One  Elevated blood sugars related to dx of Type 2 DM   Role Documenting the Problem One  Care Management Coordinator   Care Plan for Problem One  Active   [facility] Long Term Goal (31-90 days)  Member will decrease hemoglobin A1C by 2 points in the next 90 days   [facility] Long Term Goal Start Date  11/25/15   Interventions for Problem One Long Term Goal  Given Link to Wellness diabetes teaching packet and reviewed program requirements, Instructed on CHO counting and portion control, Instructed on Type 2 DM and insulin resistance, Instructed on use of hi s Metformin and  insulin, Instructed on  blood sugar goals and how they relate to his hemoglobin A1C reading, Enourgaed to use phone applications such as My Fitness Pal or Calorie King to help count CHO and calories, Encourged to continue to exercise daily and to do weight training twice a week, Instructed to cal to schedule a follow up appointment with his provider, Instructed on how to view EMMI education programs    Dudley Major RN, [facility] Care Management Coordinator-Link to Wellness [facility] Care Management [phone]

## 2015-11-26 NOTE — Patient Instructions (Signed)
1. Plan to eat 45-60 GM (3-4) servings of carbohydrate a meal and 15 GM for snacks.  Plan to eat protein with your snacks 2. Plan to check blood sugar daily either fasting or 1 -2hrs after a meal.  Goals of 80-130 fasting and 180 or less after eating. 3. Plan to try My Fitness Pal or Calorie Brooke DareKing applications to track carbohydrates 4. Plan to exercise 5-6 times a week for 30 minutes.   5. Plan to complete EMMI programs by 01/02/16 6. Plan to call provider to schedule a follow up appointment 7. Plan to return to Link to Wellness on 01/06/16 at [facility]

## 2015-12-18 MED FILL — LANTUS SOLOSTAR 100 UNITS/M: 100 | 30 days supply | Qty: 9 | Fill #1

## 2015-12-27 MED FILL — TRUE METRIX GLUCOSE TEST ST: 90 days supply | Qty: 200 | Fill #2

## 2016-01-01 ENCOUNTER — Ambulatory Visit: Payer: 59 | Admitting: Family Medicine

## 2016-01-06 ENCOUNTER — Ambulatory Visit: Payer: Self-pay

## 2016-01-06 ENCOUNTER — Ambulatory Visit (INDEPENDENT_AMBULATORY_CARE_PROVIDER_SITE_OTHER): Payer: 59 | Admitting: Medical

## 2016-01-06 ENCOUNTER — Encounter: Payer: Self-pay | Admitting: Medical

## 2016-01-06 VITALS — BP 158/102 | HR 109 | Wt 291.0 lb

## 2016-01-06 DIAGNOSIS — IMO0001 Reserved for inherently not codable concepts without codable children: Secondary | ICD-10-CM

## 2016-01-06 DIAGNOSIS — E1165 Type 2 diabetes mellitus with hyperglycemia: Secondary | ICD-10-CM

## 2016-01-06 DIAGNOSIS — R030 Elevated blood-pressure reading, without diagnosis of hypertension: Secondary | ICD-10-CM

## 2016-01-06 DIAGNOSIS — R000 Tachycardia, unspecified: Secondary | ICD-10-CM | POA: Diagnosis not present

## 2016-01-06 MED ORDER — EMPAGLIFLOZIN-METFORMIN HCL 5-500 MG PO TABS: 1.0000 | ORAL_TABLET | Freq: Two times a day (BID) | ORAL | Status: DC

## 2016-01-06 MED ORDER — INSULIN GLARGINE 100 UNIT/ML SOLOSTAR PEN: 35.0000 [IU] | PEN_INJECTOR | Freq: Every day | SUBCUTANEOUS | Status: DC

## 2016-01-06 NOTE — Patient Instructions (Signed)
Recommendations:  Begin Synjardy 5/500mg  tablets 1 tablet daily.  After 3-4 days if no side effects, increase to 1 tablet twice daily  If you are tolerating this by the time you run out, then increase to the 5/1000mg  tablets twice daily  Increase Lantus to 35 units daily at bedtime  Lets keep you at 35 units daily for now of Lantus  Monitor sugars regularly  The goal is to get under 130 morning readings  We absolutely don't want low readings under 70  So check your sugar any time you feels sweaty, weak  Have some orange juice or other quick source of sugar around for low sugar emergency  Increase your water significantly while on the HollisSynjardy  Records your sugars and return in 2 weeks.   Hypoglycemia Hypoglycemia occurs when the glucose in your blood is too low. Glucose is a type of sugar that is your body's main energy source. Hormones, such as insulin and glucagon, control the level of glucose in the blood. Insulin lowers blood glucose and glucagon increases blood glucose. Having too much insulin in your blood stream, or not eating enough food containing sugar, can result in hypoglycemia. Hypoglycemia can happen to people with or without diabetes. It can develop quickly and can be a medical emergency.  CAUSES   Missing or delaying meals.  Not eating enough carbohydrates at meals.  Taking too much diabetes medicine.  Not timing your oral diabetes medicine or insulin doses with meals, snacks, and exercise.  Nausea and vomiting.  Certain medicines.  Severe illnesses, such as hepatitis, kidney disorders, and certain eating disorders.  Increased activity or exercise without eating something extra or adjusting medicines.  Drinking too much alcohol.  A nerve disorder that affects body functions like your heart rate, blood pressure, and digestion (autonomic neuropathy).  A condition where the stomach muscles do not function properly (gastroparesis). Therefore, medicines and  food may not absorb properly.  Rarely, a tumor of the pancreas can produce too much insulin. SYMPTOMS   Hunger.  Sweating (diaphoresis).  Change in body temperature.  Shakiness.  Headache.  Anxiety.  Lightheadedness.  Irritability.  Difficulty concentrating.  Dry mouth.  Tingling or numbness in the hands or feet.  Restless sleep or sleep disturbances.  Altered speech and coordination.  Change in mental status.  Seizures or prolonged convulsions.  Combativeness.  Drowsiness (lethargic).  Weakness.  Increased heart rate or palpitations.  Confusion.  Pale, gray skin color.  Blurred or double vision.  Fainting. DIAGNOSIS  A physical exam and medical history will be performed. Your caregiver may make a diagnosis based on your symptoms. Blood tests and other lab tests may be performed to confirm a diagnosis. Once the diagnosis is made, your caregiver will see if your signs and symptoms go away once your blood glucose is raised.  TREATMENT  Usually, you can easily treat your hypoglycemia when you notice symptoms.  Check your blood glucose. If it is less than 70 mg/dl, take one of the following:   3-4 glucose tablets.    cup juice.    cup regular soda.   1 cup skim milk.   -1 tube of glucose gel.   5-6 hard candies.   Avoid high-fat drinks or food that may delay a rise in blood glucose levels.  Do not take more than the recommended amount of sugary foods, drinks, gel, or tablets. Doing so will cause your blood glucose to go too high.   Wait 10-15 minutes and recheck your  blood glucose. If it is still less than 70 mg/dl or below your target range, repeat treatment.   Eat a snack if it is more than 1 hour until your next meal.  There may be a time when your blood glucose may go so low that you are unable to treat yourself at home when you start to notice symptoms. You may need someone to help you. You may even faint or be unable to  swallow. If you cannot treat yourself, someone will need to bring you to the hospital.  HOME CARE INSTRUCTIONS  If you have diabetes, follow your diabetes management plan by:  Taking your medicines as directed.  Following your exercise plan.  Following your meal plan. Do not skip meals. Eat on time.  Testing your blood glucose regularly. Check your blood glucose before and after exercise. If you exercise longer or different than usual, be sure to check blood glucose more frequently.  Wearing your medical alert jewelry that says you have diabetes.  Identify the cause of your hypoglycemia. Then, develop ways to prevent the recurrence of hypoglycemia.  Do not take a hot bath or shower right after an insulin shot.  Always carry treatment with you. Glucose tablets are the easiest to carry.  If you are going to drink alcohol, drink it only with meals.  Tell friends or family members ways to keep you safe during a seizure. This may include removing hard or sharp objects from the area or turning you on your side.  Maintain a healthy weight. SEEK MEDICAL CARE IF:   You are having problems keeping your blood glucose in your target range.  You are having frequent episodes of hypoglycemia.  You feel you might be having side effects from your medicines.  You are not sure why your blood glucose is dropping so low.  You notice a change in vision or a new problem with your vision. SEEK IMMEDIATE MEDICAL CARE IF:   Confusion develops.  A change in mental status occurs.  The inability to swallow develops.  Fainting occurs.   This information is not intended to replace advice given to you by your health care provider. Make sure you discuss any questions you have with your health care provider.   Document Released: 07/20/2005 Document Revised: 07/25/2013 Document Reviewed: 03/26/2015 Elsevier Interactive Patient Education [employer]

## 2016-01-06 NOTE — Progress Notes (Signed)
Subjective: Chief Complaint  Patient presents with  . blood sugar running high    was 401 this am before he ate. light headed and sweaty. no problems with vision.    Here for elevated sugars.    He normally sees Vickie here.   He notes that he checks sugars some days per week in the mornings.  Has plenty of testing supplies.  In recent weeks sugars have been no lower tan 180, and lately in the 300+ range.  Glucose was over 400 this morning.  He is going out of the town tomorrow until the weekends for a trip.  He is currently using Lantus 30 u QHS.  He quit metformin due to nausea and diarrhea.   He has a consult with Dr. Everardo All in January 2017 but didn't feel comfortable with there discussion, was taken aback about recommendation for weight loss surgery.  At this point he wants advice to get sugars down.  Denies chest pain, SOB, edema.    He was diagnosed back in 2013 with diabetes.  Father is a diabetic.   Past Medical History  Diagnosis Date  . DKA (diabetic ketoacidoses) (HCC) 12/30/11    new dx DM a1c 13  . Diabetes mellitus 12/30/11  . 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 Right bundle branch block    Current Outpatient Prescriptions on File Prior to Visit  Medication Sig Dispense Refill  . aspirin EC 81 MG tablet Take 81 mg by mouth daily. Reported on 10/01/2015    . metFORMIN (GLUCOPHAGE) 1000 MG tablet Take 1,000 mg by mouth 2 (two) times daily with a meal.  0   No current facility-administered medications on file prior to visit.     ROS as in subjective   Objective: BP 158/102 mmHg  Pulse 109  Wt 291 lb (131.997 kg)  Wt Readings from Last 3 Encounters:  01/06/16 291 lb (131.997 kg)  11/25/15 291 lb 12.8 oz (132.36 kg)  10/01/15 281 lb (127.461 kg)   BP Readings from Last 3 Encounters:  01/06/16 158/102  11/25/15 130/88  09/25/15 126/80   Of note, weight of 275 lb back in 2014  Gen: wd, wn,  nad Heart RRR, normal s1, s2, no murmurs Lungs clear Pulses 2+   Assessment: Encounter Diagnoses  Name Primary?  Marland Kitchen Uncontrolled type 2 diabetes mellitus without complication, without long-term current use of insulin (HCC) Yes  . Elevated blood-pressure reading without diagnosis of hypertension   . Tachycardia     Plan: After reviewing his readings and discussing diet, prior chart records over the past few years, I advised that he seems have quite large fluctuations with glucose control along with weight fluctuations now and in the past.  He will change to [facility], will increase to 35 units Lantus, will monitor sugars, and f/u in 2wk.  If glucose >450 in the meantime, or if other concerns call or recheck sooner.  Discussed hydration, diet, and will need to recheck and address the elevated BPs and pulse next visit.  Hopefully he will tolerate Synjardy since he hasn't tolerated metformin generic. Discussed risk/benefits of medication.  F/u 2wk, sooner prn. Samples of Synjardy given.  Nashaun was seen today for blood sugar running high.  Diagnoses and all orders for this visit:  Uncontrolled type 2 diabetes mellitus without complication, without long-term current use of insulin (HCC)  Elevated blood-pressure reading without diagnosis of hypertension  Tachycardia  Other  orders -     Empagliflozin-Metformin HCl (SYNJARDY) 5-500 MG TABS; Take 1 tablet by mouth 2 (two) times daily. -     Insulin Glargine (LANTUS SOLOSTAR) 100 UNIT/ML Solostar Pen; Inject 35 Units into the skin daily. And pen needles 1/day

## 2016-01-07 MED FILL — LANTUS SOLOSTAR 100 UNITS/M: 100 | 34 days supply | Qty: 12 | Fill #0

## 2016-01-15 ENCOUNTER — Ambulatory Visit: Payer: 59 | Admitting: Family Medicine

## 2016-01-20 ENCOUNTER — Other Ambulatory Visit: Payer: Self-pay

## 2016-01-20 VITALS — BP 134/84 | HR 98 | Resp 16 | Ht 71.0 in | Wt 290.4 lb

## 2016-01-20 DIAGNOSIS — IMO0001 Reserved for inherently not codable concepts without codable children: Secondary | ICD-10-CM

## 2016-01-20 DIAGNOSIS — E1165 Type 2 diabetes mellitus with hyperglycemia: Principal | ICD-10-CM

## 2016-01-20 NOTE — Patient Outreach (Signed)
[facility]) Care Management   01/20/2016  Roy Jennings [birth year] [id number]  Roy Jennings is an 35 y.o. male.   Member seen for follow up office visit for Link to Wellness program for self management of Type 2 diabetes  Subjective: Member states that he has been under a lot of stress.  States that he was layed off of his job last week.  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  States that he has been eating due to stress.  States he was on vacation and was eating foods that he normally would not eat.  States that when he saw the PA 2 wks ago he put him on a new medication but he only took a few doses and quit because he was driving to [facility] and it made him urinate more and upset his stomach.  States he lost his glucometer and has not checked his blood sugars since they returned from his trip.  States he is trying to walk every morning and he is planning on joining the [facility] soon.   Objective:   Review of Systems  All other systems reviewed and are negative.   Physical Exam Today's Vitals   01/20/16 1525  BP: 134/84  Pulse: 98  Resp: 16  Height: 1.803 m ( )  Weight: 290 lb 6.4 oz (131.725 kg)  SpO2: 96%  PainSc: 0-No pain   Encounter Medications:   Outpatient Encounter Prescriptions as of 01/20/2016  Medication Sig Note  . aspirin EC 81 MG tablet Take 81 mg by mouth daily. Reported on 10/01/2015   . Insulin Glargine (LANTUS SOLOSTAR) 100 UNIT/ML Solostar Pen Inject 35 Units into the skin daily. And pen needles 1/day   . Empagliflozin-Metformin HCl (SYNJARDY) 5-500 MG TABS Take 1 tablet by mouth 2 (two) times daily. (Patient not taking: Reported on 01/20/2016)   . metFORMIN (GLUCOPHAGE) 1000 MG tablet Take 1,000 mg by mouth 2 (two) times daily with a meal. Reported on 01/20/2016 09/25/2015: Received from: External Pharmacy   No facility-administered encounter medications on file as of 01/20/2016.    Functional Status:   In your present  state of health, do you have any difficulty performing the following activities: 01/20/2016 11/25/2015  Hearing? N N  Vision? N N  Difficulty concentrating or making decisions? N N  Walking or climbing stairs? N N  Dressing or bathing? N N  Doing errands, shopping? N N    Fall/Depression Screening:    PHQ 2/9 Scores 01/20/2016 11/25/2015 10/05/2015 07/30/2015 06/18/2014 02/20/2014 01/01/2014  PHQ - 2 Score 0 0 0 0 0 0 0    Assessment:  Member seen for follow up office visit for Link to Wellness program for self management of Type 2 diabetes. Member is not meeting diabetes self management goal of hemoglobin A1C of 7% or below with last reading of 11.2%. Member given Synjardy 2 wks ago but only took a few doses before stopping while he was on a trip but he is taking his  Lantus insulin. Member lost glucometer and has not checked blood sugars since he returned from his trip.  Member issued a new True Metrix glucometer. Reports increase exercising 5-6 times a week. Member trying to watch CHO portions but reported stress eating and eating junk food on his trip.  Reports being very stress since he was layed off his job. Member is up to date with annual eye exam.   Plan:  Plan to eat 45-60  GM (3-4) servings of carbohydrate a meal and 15 GM for snacks.  Plan to eat protein with your snacks Plan to check blood sugar twice a day fasting or 1 -2hrs after a meal.  Goals of 80-130 fasting and 180 or less after eating. Plan to start taking Synjardy tonight Plan to see counselor on 01/27/16 Plan to walk 5-6 times a week for 30 minutes.  [facility]  Plan to complete EMMI programs by 02/15/16 Plan to keep 01/22/16  follow up appointment with provider Plan to return to Link to Wellness on 02/27/16 at 1:30PM  [facility] Problem One        Most Recent Value   Care Plan Problem One  Elevated blood sugars related to dx of Type 2 DM   Role Documenting the Problem One  Care Management Coordinator    Care Plan for Problem One  Active   [facility] Long Term Goal (31-90 days)  Member will decrease hemoglobin A1C by 2 points in the next 90 days   [facility] Long Term Goal Start Date  01/20/16 [Continue has not had hemoglobin A1C rechecked yet]   Interventions for Problem One Long Term Goal  Reinforced on CHO counting and portion control, Reinforced Instructions on Type 2 DM and insulin resistance, Instructed to try to restart his Synjardy and take with food, Instructed to discuss medication options with provider on 01/22/16 appt adn given list of free Link to Wellness medications to take to appt, Instructed on the role stress can play on blood sugars and given contact information for the Employee Assistance Counseling Program, Member had wife call to schedule appt during the Link to Wellness visit,  Issued a new True Metrix glucometer  Reinforced  blood sugar goals and how they relate to his hemoglobin A1C reading,  Encourged to continue to exercise daily and to do weight training twice a week, Reassigned EMMI education programs    Dudley MajorMelissa Sandlin RN, [facility],CCM Care Management Coordinator-Link to Wellness [facility] Care Management [phone]

## 2016-01-20 NOTE — Patient Instructions (Signed)
1. Plan to eat 45-60 GM (3-4) servings of carbohydrate a meal and 15 GM for snacks.  Plan to eat protein with your snacks 2. Plan to check blood sugar twice a day fasting or 1 -2hrs after a meal.  Goals of 80-130 fasting and 180 or less after eating. 3. Plan to start taking Synjardy tonight 4. Plan to see counselor on 01/27/16 5. Plan to walk 5-6 times a week for 30 minutes.  [facility]  6. Plan to complete EMMI programs by 02/15/16 7. Plan to keep 01/22/16  follow up appointment with provider 8. Plan to return to Link to Wellness on 02/27/16 at 1:30PM

## 2016-01-21 ENCOUNTER — Telehealth: Payer: Self-pay | Admitting: Medical

## 2016-01-21 NOTE — Telephone Encounter (Signed)
Make sure we make him a f/u appt

## 2016-01-21 NOTE — Telephone Encounter (Signed)
Pt has an appt for tomorrow.  ?

## 2016-01-22 ENCOUNTER — Encounter: Payer: Self-pay | Admitting: Medical

## 2016-01-22 ENCOUNTER — Ambulatory Visit (INDEPENDENT_AMBULATORY_CARE_PROVIDER_SITE_OTHER): Payer: 59 | Admitting: Medical

## 2016-01-22 VITALS — BP 130/88 | HR 112 | Wt 286.0 lb

## 2016-01-22 DIAGNOSIS — F411 Generalized anxiety disorder: Secondary | ICD-10-CM

## 2016-01-22 DIAGNOSIS — E1165 Type 2 diabetes mellitus with hyperglycemia: Secondary | ICD-10-CM

## 2016-01-22 DIAGNOSIS — IMO0001 Reserved for inherently not codable concepts without codable children: Secondary | ICD-10-CM

## 2016-01-22 DIAGNOSIS — Z566 Other physical and mental strain related to work: Secondary | ICD-10-CM | POA: Diagnosis not present

## 2016-01-22 DIAGNOSIS — Z630 Problems in relationship with spouse or partner: Secondary | ICD-10-CM | POA: Diagnosis not present

## 2016-01-22 MED ORDER — EMPAGLIFLOZIN-METFORMIN HCL 5-500 MG PO TABS: 1.0000 | ORAL_TABLET | Freq: Two times a day (BID) | ORAL | Status: DC

## 2016-01-22 MED ORDER — CITALOPRAM HYDROBROMIDE 20 MG PO TABS: 20.0000 mg | ORAL_TABLET | Freq: Every day | ORAL | Status: DC

## 2016-01-22 MED FILL — CITALOPRAM HBR 20 MG TABLET: 20 | 30 days supply | Qty: 30 | Fill #0

## 2016-01-22 NOTE — Patient Instructions (Signed)
Recommendations  Seek counseling  Begin Citalopram daily at bedtime  Continue Synjardy tablets twice daily  Increase to Lantus 40 u nightly.  Increase Lantus 5 units every 3 days until morning sugars are running mid 100s  Recheck in 2-3 weeks.

## 2016-01-22 NOTE — Progress Notes (Signed)
Subjective: Chief Complaint  Patient presents with  . Follow-up    lost job and has been very anxious and stressed. would like to see about getting on medication for this   Here for f/u. I saw him recently for uncontrolled diabetes.  He began samples of Synjardy and increased Lantus.  However, he didn't start Synjardy until a few days ago so sugars still in the 200-300s.    He is here for anxiety.  Been having symptoms including anxious feeling, more irritable, short with people, not really down or depressed, sometimes gets mood swings, problems sleeping.  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  His boss learned of this and fired him few days ago.   He had been at the prior job 4 years.  Thus he is having anxiety regarding finances, work, new business, he has a 16mo baby, and marital stress is a problem.  He has seen a counselor in the past, but not currently.   He goes to church, has support system at [employer].  He notes long history of anxiety in the past.  Was on some type of medication 5 years ago but he can't recall the name.   He is taking an OTC herbal anxiety medication that he just started.     Family history: Grandmother with history od depression.   Parents without mental health issues.    No other aggravating or relieving factors. No other complaint.    Past Medical History  Diagnosis Date  . DKA (diabetic ketoacidoses) (HCC) 12/30/11    new dx DM a1c 13  . Diabetes mellitus 12/30/11  . PVC (premature ventricular contraction)     LBB inferior axis QRS // [facility]  . Right bundle branch block    ROS as in subjective    Objective: BP 130/88 mmHg  Pulse 112  Wt 286 lb (129.729 kg)  Gen: wd, wn, nad Psych: pleasant, good eye contact, answers questions appropriately   Assessment: Encounter Diagnoses  Name Primary?  . Generalized anxiety disorder  Yes  . Stressful job   . Marital stress   . Uncontrolled type 2 diabetes mellitus without complication, without long-term current use of insulin (HCC)     Plan: Anxiety - discuss his concerns, recent stressors, but also prior issues with anxiety.  Advised he make a new appt with his counselor ASAP.  He wished to try medication.  Begin trial of Citalopram. discussed risks/benefits of medication.  Recheck 2wk, sooner prn  Diabetes - c/t Synjardy, increase lantus to 40u QHS, and increase 5 units every 3 days until morning sugars in mid 150s.  Recheck 2wk.   Patient Instructions  Recommendations  Seek counseling  Begin Citalopram daily at bedtime  Continue Synjardy tablets twice daily  Increase to Lantus 40 u nightly.  Increase Lantus 5 units every 3 days until morning sugars are running mid 100s  Recheck in 2-3 weeks.

## 2016-01-31 MED FILL — SYNJARDY 5-500 MG TABLET: 5-500 | 30 days supply | Qty: 60 | Fill #0

## 2016-02-06 ENCOUNTER — Ambulatory Visit: Payer: 59 | Admitting: Medical

## 2016-02-07 ENCOUNTER — Ambulatory Visit: Payer: 59 | Admitting: Medical

## 2016-02-07 ENCOUNTER — Encounter: Payer: Self-pay | Admitting: Family Medicine

## 2016-02-07 ENCOUNTER — Ambulatory Visit (INDEPENDENT_AMBULATORY_CARE_PROVIDER_SITE_OTHER): Payer: 59 | Admitting: Family Medicine

## 2016-02-07 VITALS — BP 122/80 | HR 106 | Resp 18 | Wt 289.2 lb

## 2016-02-07 DIAGNOSIS — IMO0001 Reserved for inherently not codable concepts without codable children: Secondary | ICD-10-CM

## 2016-02-07 DIAGNOSIS — F411 Generalized anxiety disorder: Secondary | ICD-10-CM | POA: Diagnosis not present

## 2016-02-07 DIAGNOSIS — E1165 Type 2 diabetes mellitus with hyperglycemia: Secondary | ICD-10-CM

## 2016-02-07 LAB — CBC WITH DIFFERENTIAL/PLATELET
BASOS ABS: 0 {cells}/uL (ref 0–200)
Basophils Relative: 0 %
EOS PCT: 2 %
Eosinophils Absolute: 140 cells/uL (ref 15–500)
HEMATOCRIT: 43.5 % (ref 38.5–50.0)
HEMOGLOBIN: 14 g/dL (ref 13.2–17.1)
LYMPHS ABS: 2380 {cells}/uL (ref 850–3900)
LYMPHS PCT: 34 %
MCH: 26.2 pg — AB (ref 27.0–33.0)
MCHC: 32.2 g/dL (ref 32.0–36.0)
MCV: 81.3 fL (ref 80.0–100.0)
MONO ABS: 560 {cells}/uL (ref 200–950)
MPV: 9.2 fL (ref 7.5–12.5)
Monocytes Relative: 8 %
NEUTROS PCT: 56 %
Neutro Abs: 3920 cells/uL (ref 1500–7800)
Platelets: 359 10*3/uL (ref 140–400)
RBC: 5.35 MIL/uL (ref 4.20–5.80)
RDW: 14.3 % (ref 11.0–15.0)
WBC: 7 10*3/uL (ref 4.0–10.5)

## 2016-02-07 LAB — POCT GLYCOSYLATED HEMOGLOBIN (HGB A1C): HEMOGLOBIN A1C: 10.6

## 2016-02-07 NOTE — Progress Notes (Signed)
   Subjective:    Patient ID: Roy Jennings, male    DOB: 04-21-1981, [age] y.o.   MRN: [id number]  HPI Chief Complaint  Patient presents with  . Follow-up    diabetes and anxiety, pt states that medication is helping him and he feels better. blood sugar running about 126. has not checked in past few days.    He is here for 2 week follow up on anxiety and diabetes. He states he got fired from his job and anxiety was directly related to that. 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 He saw Vincenza HewsShane 2 wks ago and was started on Celexa 20 mg at night. He reports doing well on medication, no side effects and would like to continue on medication. He is also seeing his counselor.  Current Medication regimen for diabetes is Synjardy and lantus.  C/o of increased urination since starting medication and some loose stools but reports blood sugars have improved significantly. He did not bring in meter or readings today and states he thought he was only here for anxiety follow up. He states he is being followed by wellness center for diabetes and nutrition. Sees them every 1-2 months.  States he has to leave and does not really have time to have labs or A1c today but agreeable to do so.   Denies fever, chills, headache, dizziness, chest pain, palpitations, DOE, abdominal pain, nausea, vomiting, LE edema.    Review of Systems Pertinent positives and negatives in the history of present illness.     Objective:   Physical Exam BP 122/80 mmHg  Pulse 106  Resp 18  Wt 289 lb 3.2 oz (131.18 kg)  SpO2 98% Alert and oriented and in no acute distress.   A1c 10.6% improved from 11.2    Assessment & Plan:  GAD (generalized anxiety disorder) - Plan: TSH  Uncontrolled diabetes mellitus type 2 without complications, unspecified long term insulin use status (HCC) - Plan: CBC with Differential/Platelet, Comprehensive metabolic panel, TSH  Morbid obesity, unspecified obesity type  (HCC)  Patient was not in the room when I returned to give him his A1c result. He did have labs drawn prior to leaving. Was not able to discuss adjust plan for diabetes. He reports better control based on home blood sugars in past 2 weeks but cannot confirm this.  Anxiety seems to be much improved. He was smiling and mood was cheerful. No changes to medication. Continue seeing counselor.  Will need to contact patient with lab results and advise follow up to discuss A1c and further recommendations.

## 2016-02-07 NOTE — Patient Instructions (Signed)
Scheduled to return for fasting labs, nothing to eat or drink for 8 hours, and a complete physical exam.

## 2016-02-08 LAB — COMPREHENSIVE METABOLIC PANEL
ALBUMIN: 4.4 g/dL (ref 3.6–5.1)
ALT: 19 U/L (ref 9–46)
AST: 14 U/L (ref 10–40)
Alkaline Phosphatase: 74 U/L (ref 40–115)
BUN: 10 mg/dL (ref 7–25)
CALCIUM: 9.5 mg/dL (ref 8.6–10.3)
CHLORIDE: 101 mmol/L (ref 98–110)
CO2: 24 mmol/L (ref 20–31)
Creat: 1.18 mg/dL (ref 0.60–1.35)
Glucose, Bld: 214 mg/dL — ABNORMAL HIGH (ref 65–99)
POTASSIUM: 3.8 mmol/L (ref 3.5–5.3)
Sodium: 139 mmol/L (ref 135–146)
Total Bilirubin: 0.4 mg/dL (ref 0.2–1.2)
Total Protein: 7.4 g/dL (ref 6.1–8.1)

## 2016-02-08 LAB — TSH: TSH: 0.47 m[IU]/L (ref 0.40–4.50)

## 2016-02-14 MED FILL — LANTUS SOLOSTAR 100 UNITS/M: 100 | 34 days supply | Qty: 12 | Fill #1

## 2016-02-18 MED FILL — CITALOPRAM HBR 20 MG TABLET: 20 | 30 days supply | Qty: 30 | Fill #1

## 2016-02-27 ENCOUNTER — Ambulatory Visit: Payer: 59

## 2016-03-16 ENCOUNTER — Ambulatory Visit: Payer: Self-pay

## 2016-03-19 MED FILL — LANTUS SOLOSTAR 100 UNITS/M: 100 | 34 days supply | Qty: 12 | Fill #2

## 2016-03-19 MED FILL — CITALOPRAM HBR 20 MG TABLET: 20 | 30 days supply | Qty: 30 | Fill #2

## 2016-04-13 MED FILL — SYNJARDY 5-500 MG TABLET: 5-500 | 30 days supply | Qty: 60 | Fill #1

## 2016-05-05 MED FILL — LANTUS SOLOSTAR 100 UNITS/M: 100 | 25 days supply | Qty: 9 | Fill #3

## 2016-05-21 ENCOUNTER — Other Ambulatory Visit: Payer: Self-pay | Admitting: Medical

## 2016-05-21 MED FILL — LANTUS SOLOSTAR 100 UNITS/M: 100 | 42 days supply | Qty: 15 | Fill #0

## 2016-06-04 ENCOUNTER — Other Ambulatory Visit: Payer: Self-pay | Admitting: Physician Assistant

## 2016-07-06 MED FILL — LANTUS SOLOSTAR 100 UNITS/M: 100 | 42 days supply | Qty: 15 | Fill #1

## 2016-07-15 ENCOUNTER — Ambulatory Visit: Payer: 59 | Admitting: Family Medicine

## 2016-07-16 ENCOUNTER — Encounter: Payer: Self-pay | Admitting: Family Medicine

## 2016-07-16 ENCOUNTER — Ambulatory Visit (INDEPENDENT_AMBULATORY_CARE_PROVIDER_SITE_OTHER): Payer: 59 | Admitting: Family Medicine

## 2016-07-16 VITALS — BP 120/78 | HR 105 | Wt 284.8 lb

## 2016-07-16 DIAGNOSIS — E669 Obesity, unspecified: Secondary | ICD-10-CM | POA: Diagnosis not present

## 2016-07-16 DIAGNOSIS — E1165 Type 2 diabetes mellitus with hyperglycemia: Secondary | ICD-10-CM

## 2016-07-16 DIAGNOSIS — Z91199 Patient's noncompliance with other medical treatment and regimen due to unspecified reason: Secondary | ICD-10-CM | POA: Insufficient documentation

## 2016-07-16 DIAGNOSIS — IMO0001 Reserved for inherently not codable concepts without codable children: Secondary | ICD-10-CM

## 2016-07-16 DIAGNOSIS — Z9119 Patient's noncompliance with other medical treatment and regimen: Secondary | ICD-10-CM

## 2016-07-16 LAB — CBC WITH DIFFERENTIAL/PLATELET
BASOS ABS: 0 {cells}/uL (ref 0–200)
Basophils Relative: 0 %
EOS ABS: 74 {cells}/uL (ref 15–500)
Eosinophils Relative: 1 %
HCT: 41.1 % (ref 38.5–50.0)
Hemoglobin: 13.3 g/dL (ref 13.2–17.1)
LYMPHS PCT: 35 %
Lymphs Abs: 2590 cells/uL (ref 850–3900)
MCH: 26.9 pg — AB (ref 27.0–33.0)
MCHC: 32.4 g/dL (ref 32.0–36.0)
MCV: 83.2 fL (ref 80.0–100.0)
MONOS PCT: 7 %
MPV: 9.5 fL (ref 7.5–12.5)
Monocytes Absolute: 518 cells/uL (ref 200–950)
Neutro Abs: 4218 cells/uL (ref 1500–7800)
Neutrophils Relative %: 57 %
PLATELETS: 348 10*3/uL (ref 140–400)
RBC: 4.94 MIL/uL (ref 4.20–5.80)
RDW: 14.2 % (ref 11.0–15.0)
WBC: 7.4 10*3/uL (ref 4.0–10.5)

## 2016-07-16 LAB — POCT URINALYSIS DIPSTICK
Bilirubin, UA: NEGATIVE
Blood, UA: NEGATIVE
Ketones, UA: NEGATIVE
Leukocytes, UA: NEGATIVE
Nitrite, UA: NEGATIVE
PROTEIN UA: NEGATIVE
SPEC GRAV UA: 1.015
UROBILINOGEN UA: NEGATIVE
pH, UA: 5.5

## 2016-07-16 LAB — POCT GLYCOSYLATED HEMOGLOBIN (HGB A1C)

## 2016-07-16 LAB — GLUCOSE, POCT (MANUAL RESULT ENTRY): POC Glucose: 496 mg/dl — AB (ref 70–99)

## 2016-07-16 MED ORDER — EMPAGLIFLOZIN 10 MG PO TABS: 10.0000 mg | ORAL_TABLET | Freq: Every day | ORAL | 1 refills | Status: DC

## 2016-07-16 MED ORDER — MICROLET LANCETS MISC: 1 refills | Status: DC

## 2016-07-16 MED ORDER — METFORMIN HCL ER 500 MG PO TB24: 500.0000 mg | ORAL_TABLET | Freq: Every day | ORAL | 2 refills | Status: DC

## 2016-07-16 MED ORDER — DULAGLUTIDE 0.75 MG/0.5ML ~~LOC~~ SOAJ: SUBCUTANEOUS | 2 refills | Status: DC

## 2016-07-16 MED ORDER — DULAGLUTIDE 0.75 MG/0.5ML ~~LOC~~ SOAJ: SUBCUTANEOUS | 0 refills | Status: DC

## 2016-07-16 MED ORDER — GLUCOSE BLOOD VI STRP: ORAL_STRIP | 1 refills | Status: DC

## 2016-07-16 MED FILL — METFORMIN HCL ER 500 MG TAB: 500 | 30 days supply | Qty: 30 | Fill #0

## 2016-07-16 MED FILL — CONTOUR NEXT STRIPS: 50 days supply | Qty: 100 | Fill #0

## 2016-07-16 MED FILL — JARDIANCE 10 MG TABLET: 10 | 30 days supply | Qty: 30 | Fill #0

## 2016-07-16 MED FILL — MICROLET LANCETS: 50 days supply | Qty: 100 | Fill #0

## 2016-07-16 NOTE — Patient Instructions (Signed)
Your hemoglobin A1C is greater than 14% today and this is getting close to being dangerous.   I recommend you stop the HudsonSynjardy. Start taking Glucophage and Jardiance once daily. Take the once weekly Trulicity as discussed. Continue taking Lantus.   It is extremely important that you check your blood sugar daily: Once every morning fasting and the goal range is 80-130.  2 hours after lunch or supper and the goal range is 130-180.   Call me in 2 weeks with your readings or call if you have any low readings less than 80.  I would like to get you in to see an endocrinologist so be aware that they may try to call you.   It is time to get serious about your diet. Reduce the amount of sugar and carbohydrate you eat. Avoid all drinks with sugar in them. Drink more water.   Keep walking daily.    Carbohydrate Counting for Diabetes Mellitus, Adult Carbohydrate counting is a method for keeping track of how many carbohydrates you eat. Eating carbohydrates naturally increases the amount of sugar (glucose) in the blood. Counting how many carbohydrates you eat helps keep your blood glucose within normal limits, which helps you manage your diabetes (diabetes mellitus). It is important to know how many carbohydrates you can safely have in each meal. This is different for every person. A diet and nutrition specialist (registered dietitian) can help you make a meal plan and calculate how many carbohydrates you should have at each meal and snack. Carbohydrates are found in the following foods:  Grains, such as breads and cereals.  Dried beans and soy products.  Starchy vegetables, such as potatoes, peas, and corn.  Fruit and fruit juices.  Milk and yogurt.  Sweets and snack foods, such as cake, cookies, candy, chips, and soft drinks. How do I count carbohydrates? There are two ways to count carbohydrates in food. You can use either of the methods or a combination of both. Reading "Nutrition Facts" on  packaged food  The "Nutrition Facts" list is included on the labels of almost all packaged foods and beverages in the [place] It includes:  The serving size.  Information about nutrients in each serving, including the grams (g) of carbohydrate per serving. To use the "Nutrition Facts":  Decide how many servings you will have.  Multiply the number of servings by the number of carbohydrates per serving.  The resulting number is the total amount of carbohydrates that you will be having. Learning standard serving sizes of other foods  When you eat foods containing carbohydrates that are not packaged or do not include "Nutrition Facts" on the label, you need to measure the servings in order to count the amount of carbohydrates:  Measure the foods that you will eat with a food scale or measuring cup, if needed.  Decide how many standard-size servings you will eat.  Multiply the number of servings by 15. Most carbohydrate-rich foods have about 15 g of carbohydrates per serving.  For example, if you eat 8 oz (170 g) of strawberries, you will have eaten 2 servings and 30 g of carbohydrates (2 servings x 15 g = 30 g).  For foods that have more than one food mixed, such as soups and casseroles, you must count the carbohydrates in each food that is included. The following list contains standard serving sizes of common carbohydrate-rich foods. Each of these servings has about 15 g of carbohydrates:   hamburger bun or  English muffin.  oz (15 mL) syrup.   oz (14 g) jelly.  1 slice of bread.  1 six-inch tortilla.  3 oz (85 g) cooked rice or pasta.  4 oz (113 g) cooked dried beans.  4 oz (113 g) starchy vegetable, such as peas, corn, or potatoes.  4 oz (113 g) hot cereal.  4 oz (113 g) mashed potatoes or  of a large baked potato.  4 oz (113 g) canned or frozen fruit.  4 oz (120 mL) fruit juice.  4-6 crackers.  6 chicken nuggets.  6 oz (170 g) unsweetened dry cereal.  6 oz  (170 g) plain fat-free yogurt or yogurt sweetened with artificial sweeteners.  8 oz (240 mL) milk.  8 oz (170 g) fresh fruit or one small piece of fruit.  24 oz (680 g) popped popcorn. Example of carbohydrate counting Sample meal  3 oz (85 g) chicken breast.  6 oz (170 g) brown rice.  4 oz (113 g) corn.  8 oz (240 mL) milk.  8 oz (170 g) strawberries with sugar-free whipped topping. Carbohydrate calculation 1. Identify the foods that contain carbohydrates:  Rice.  Corn.  Milk.  Strawberries. 2. Calculate how many servings you have of each food:  2 servings rice.  1 serving corn.  1 serving milk.  1 serving strawberries. 3. Multiply each number of servings by 15 g:  2 servings rice x 15 g = 30 g.  1 serving corn x 15 g = 15 g.  1 serving milk x 15 g = 15 g.  1 serving strawberries x 15 g = 15 g. 4. Add together all of the amounts to find the total grams of carbohydrates eaten:  30 g + 15 g + 15 g + 15 g = 75 g of carbohydrates total. This information is not intended to replace advice given to you by your health care provider. Make sure you discuss any questions you have with your health care provider. Document Released: 07/20/2005 Document Revised: 02/07/2016 Document Reviewed: 01/01/2016 Elsevier Interactive Patient Education  2017 [employer]

## 2016-07-16 NOTE — Progress Notes (Signed)
Subjective:    Patient ID: Roy Jennings, male    DOB: 12-12-1980, 35 y.o.   MRN: [id number]  Roy Jennings is a 35 y.o. male who presents for follow-up of Type 2 diabetes mellitus.  Patient is checking home blood sugars but not as recommended.  Home blood sugar records: BGs range between 120 and 330. Fasting 180.  How often is blood sugars being checked: once every couple days.  Supposed to check twice daily Current symptoms include: none. Patient denies increased appetite, nausea, visual disturbances and vomiting.  Patient is checking their feet daily. Any Foot concerns (callous, ulcer, wound, thickened nails, toenail fungus, skin fungus, hammer toe): none Last dilated eye exam: March 2017  Current treatments: taking DM meds . synjardy 1-2 times per week. lantus 52 units in the mornings.   Medication compliance: good with Lantus but poor with oral medication.   Current diet: in general, a "healthy" diet   vegetables and fruit. Eats sweets and drinks sweet tea.  Current exercise: walking 10,000 steps and basketball Known diabetic complications: none   The following portions of the patient's history were reviewed and updated as appropriate: allergies, current medications, past medical history, past social history and problem list.  ROS as in subjective above.     Objective:    Physical Exam Alert and in no distress otherwise not examined.  Urinalysis dipstick glucose 3+ but otherwise negative.   Blood pressure 120/78, pulse (!) 105, weight 284 lb 12.8 oz (129.2 kg).  Lab Review Diabetic Labs Latest Ref Rng & Units 07/16/2016 02/07/2016 08/14/2015 07/30/2015 10/03/2014  HbA1c - >14.0 10.6 - 11.2 6.5  Microalbumin <2.0 mg/dL - - - - -  Micro/Creat Ratio 0.0 - 30.0 mg/g - - - - -  Chol 0 - 200 mg/dL - - - - -  HDL >13>39 mg/dL - - - - -  Calc LDL 0 - 99 mg/dL - - - - -  Triglycerides <150 mg/dL - - - - -  Creatinine 2.440.60 - 1.35 mg/dL - 0.101.18 2.721.14 5.36(U1.36(H) 4.401.21  GFR >60.00 mL/min - - -  - 87.88   BP/Weight 07/16/2016 02/07/2016 01/22/2016 01/20/2016 01/06/2016  Systolic BP 120 122 130 134 158  Diastolic BP 78 80 88 84 102  Wt. (Lbs) 284.8 289.2 286 290.4 291  BMI 39.72 40.35 39.91 40.52 40.6   Foot/eye exam completion dates Latest Ref Rng & Units 09/02/2015 06/18/2014  Eye Exam No Retinopathy - No Retinopathy  Foot Form Completion - Done -    Roy Jennings  reports that he has never smoked. He has never used smokeless tobacco. He reports that he drinks alcohol. He reports that he does not use drugs.     Assessment & Plan:    Uncontrolled diabetes mellitus type 2 without complications, unspecified long term insulin use status (HCC) - Plan: HgB A1c, CBC with Differential/Platelet, COMPLETE METABOLIC PANEL WITH GFR, POCT glucose (manual entry), POCT urinalysis dipstick, metFORMIN (GLUCOPHAGE XR) 500 MG 24 hr tablet, empagliflozin (JARDIANCE) 10 MG TABS tablet, Dulaglutide (TRULICITY) 0.75 MG/0.5ML SOPN, Microalbumin / creatinine urine ratio  Obesity, Class II, BMI 35-39.9  Personal history of noncompliance with medical treatment, presenting hazards to health  1. Rx changes: stop Synjardy. Start Glucophage and Jardiance once daily. Do the once weekly Trulicity injection and continue taking daily Lantus 2. Education: Reviewed 'ABCs' of diabetes management (respective goals in parentheses):  A1C (<7), blood pressure (<130/80), and cholesterol (LDL <100). 3. Compliance at present is estimated to be poor.  Efforts to improve compliance (if necessary) will be directed at dietary modifications: reduce sugar and carbohydrate intake, increased exercise, regular blood sugar monitoring: twice times daily and enouraged good compliance with medication. 4. Follow up: 4 weeks  5. microalbumin ordered.  6. He will call in 2 weeks with blood sugar readings.  7. Counseled that he is putting himself at a great health risk by not taking care of his diabetes, including kidney disease, heart disease, stroke  and death. He verbalized understanding.  8. Referral back to endocrinology.

## 2016-07-17 ENCOUNTER — Telehealth: Payer: Self-pay

## 2016-07-17 DIAGNOSIS — E871 Hypo-osmolality and hyponatremia: Secondary | ICD-10-CM

## 2016-07-17 LAB — MICROALBUMIN / CREATININE URINE RATIO
Creatinine, Urine: 53 mg/dL (ref 20–370)
MICROALB/CREAT RATIO: 6 ug/mg{creat} (ref <30)
Microalb, Ur: 0.3 mg/dL

## 2016-07-17 LAB — COMPLETE METABOLIC PANEL WITH GFR
ALT: 33 U/L (ref 9–46)
AST: 19 U/L (ref 10–40)
Albumin: 4 g/dL (ref 3.6–5.1)
Alkaline Phosphatase: 126 U/L — ABNORMAL HIGH (ref 40–115)
BILIRUBIN TOTAL: 0.4 mg/dL (ref 0.2–1.2)
BUN: 11 mg/dL (ref 7–25)
CHLORIDE: 98 mmol/L (ref 98–110)
CO2: 23 mmol/L (ref 20–31)
Calcium: 9.2 mg/dL (ref 8.6–10.3)
Creat: 1.26 mg/dL (ref 0.60–1.35)
GFR, EST AFRICAN AMERICAN: 84 mL/min (ref >=60)
GFR, EST NON AFRICAN AMERICAN: 73 mL/min (ref >=60)
Glucose, Bld: 496 mg/dL — ABNORMAL HIGH (ref 65–99)
POTASSIUM: 4.4 mmol/L (ref 3.5–5.3)
Sodium: 131 mmol/L — ABNORMAL LOW (ref 135–146)
Total Protein: 6.9 g/dL (ref 6.1–8.1)

## 2016-07-17 NOTE — Telephone Encounter (Signed)
Yes, per Martie LeeSabrina

## 2016-07-17 NOTE — Telephone Encounter (Signed)
Fax rcvd with alert value of glucose at 490 on this pt.  Yesterday glucose noted at 496 in office and A1C over 14.0 via fingerstick. Vickie was aware of elevated glucose yesterday. Shane notified. Trixie Rude/RLB

## 2016-07-17 NOTE — Telephone Encounter (Signed)
Is this the one Vickie was aware of yesterday and handled?

## 2016-07-19 NOTE — Telephone Encounter (Signed)
Please call him Monday morning and see how his readings have been over the weekend. Get his readings and see if he is taking his medication please. Thanks.

## 2016-07-20 NOTE — Addendum Note (Signed)
Addended by: Herminio CommonsJOHNSON, Dallen Bunte A on: 07/20/2016 09:56 AM   Modules accepted: Orders

## 2016-07-20 NOTE — Telephone Encounter (Signed)
Pt was called and states his bs are in the 200's. He has not checked it this morning but over the weekend it was 209 fasting before meal and 200 2 hours after. Pt will come by tomorrow for repeat lab work

## 2016-07-21 ENCOUNTER — Other Ambulatory Visit: Payer: 59

## 2016-07-21 DIAGNOSIS — E871 Hypo-osmolality and hyponatremia: Secondary | ICD-10-CM | POA: Diagnosis not present

## 2016-07-21 MED FILL — AMOXICILLIN 500 MG CAPSULE: 500 | 9 days supply | Qty: 28 | Fill #0

## 2016-07-22 LAB — COMPREHENSIVE METABOLIC PANEL
ALBUMIN: 4.2 g/dL (ref 3.6–5.1)
ALT: 24 U/L (ref 9–46)
AST: 20 U/L (ref 10–40)
Alkaline Phosphatase: 94 U/L (ref 40–115)
BUN: 11 mg/dL (ref 7–25)
CHLORIDE: 100 mmol/L (ref 98–110)
CO2: 23 mmol/L (ref 20–31)
CREATININE: 1.19 mg/dL (ref 0.60–1.35)
Calcium: 9.5 mg/dL (ref 8.6–10.3)
Glucose, Bld: 218 mg/dL — ABNORMAL HIGH (ref 65–99)
Potassium: 4.2 mmol/L (ref 3.5–5.3)
Sodium: 137 mmol/L (ref 135–146)
Total Bilirubin: 0.4 mg/dL (ref 0.2–1.2)
Total Protein: 7.4 g/dL (ref 6.1–8.1)

## 2016-08-10 MED FILL — LANTUS SOLOSTAR 100 UNITS/M: 100 | 42 days supply | Qty: 15 | Fill #2

## 2016-08-14 MED FILL — TRULICITY 0.75 MG/0.5 ML PE: 0.75 | 28 days supply | Qty: 2 | Fill #0

## 2016-08-18 ENCOUNTER — Ambulatory Visit: Payer: 59 | Admitting: Family Medicine

## 2016-08-20 MED FILL — JARDIANCE 10 MG TABLET: 10 | 30 days supply | Qty: 30 | Fill #1

## 2016-08-20 MED FILL — METFORMIN HCL ER 500 MG TAB: 500 | 30 days supply | Qty: 30 | Fill #1

## 2016-08-21 ENCOUNTER — Ambulatory Visit (INDEPENDENT_AMBULATORY_CARE_PROVIDER_SITE_OTHER): Payer: 59 | Admitting: Family Medicine

## 2016-08-21 ENCOUNTER — Encounter: Payer: Self-pay | Admitting: Family Medicine

## 2016-08-21 VITALS — BP 130/70 | HR 100 | Resp 18 | Wt 291.4 lb

## 2016-08-21 DIAGNOSIS — E1165 Type 2 diabetes mellitus with hyperglycemia: Secondary | ICD-10-CM

## 2016-08-21 DIAGNOSIS — IMO0001 Reserved for inherently not codable concepts without codable children: Secondary | ICD-10-CM

## 2016-08-21 LAB — GLUCOSE, POCT (MANUAL RESULT ENTRY): POC Glucose: 166 mg/dl — AB (ref 70–99)

## 2016-08-21 NOTE — Progress Notes (Signed)
   Subjective:    Patient ID: Roy Jennings, male    DOB: [birth year], 36 y.o.   MRN: [id number]  HPI Chief Complaint  Patient presents with  . Follow-up    pt reports feeling better, but has not checked glucose since then.    He is here for diabetes follow up. States he feels good. No concerns or complaints today.  Denies fever, chills, polyuria, polydipsia.   Reports good medication compliance. No longer having diarrhea since switching to glucophage.  He has not been checking his blood sugars over the past 2 weeks. Did not bring in readings today.  States he has improved his diet and drinking sugar free drinks now.   He has been reluctant and refused to go back to endocrinology since having a negative experience in the past but he states he did make an appointment and plans to go later this month.     Review of Systems Pertinent positives and negatives in the history of present illness.     Objective:   Physical Exam BP 130/70   Pulse 100   Resp 18   Wt 291 lb 6.4 oz (132.2 kg)   SpO2 99%   BMI 40.64 kg/m  Alert and oriented and in no acute distress. Not otherwise examined.    POCT CBG 166.     Assessment & Plan:  Uncontrolled diabetes mellitus type 2 without complications, unspecified long term insulin use status (HCC) - Plan: Glucose (CBG)  Discussed that his visit today was to keep a close eye on his uncontrolled blood sugars and congratulated him on making healthy diet choices.  Current medication regimen seems to be improving his blood sugar. CGB in office is 166.  He will continue to eat healthier and continue all current medications.  He will see Dr. Lucianne MussKumar on January 29th and I will appreciate his help with controlling this patient's complex diabetes.

## 2016-08-21 NOTE — Patient Instructions (Signed)
Return on Jan 26th at 8am for a physical and fasting blood work with me.   appointment with Dr. Lucianne MussKumar is January 29th at 3:30pm   Check your blood sugars every morning fasting and 2 hours after a meal and take these numbers in to see Dr. Lucianne MussKumar.

## 2016-08-27 DIAGNOSIS — Z0100 Encounter for examination of eyes and vision without abnormal findings: Secondary | ICD-10-CM | POA: Diagnosis not present

## 2016-08-27 LAB — HM DIABETES EYE EXAM

## 2016-08-28 ENCOUNTER — Encounter: Payer: Self-pay | Admitting: Family Medicine

## 2016-08-28 ENCOUNTER — Ambulatory Visit (INDEPENDENT_AMBULATORY_CARE_PROVIDER_SITE_OTHER): Payer: 59 | Admitting: Family Medicine

## 2016-08-28 VITALS — BP 120/80 | HR 85 | Ht 71.75 in | Wt 285.8 lb

## 2016-08-28 DIAGNOSIS — Z0000 Encounter for general adult medical examination without abnormal findings: Secondary | ICD-10-CM | POA: Diagnosis not present

## 2016-08-28 DIAGNOSIS — E669 Obesity, unspecified: Secondary | ICD-10-CM

## 2016-08-28 DIAGNOSIS — E7800 Pure hypercholesterolemia, unspecified: Secondary | ICD-10-CM

## 2016-08-28 LAB — POCT URINALYSIS DIPSTICK
BILIRUBIN UA: NEGATIVE
Ketones, UA: NEGATIVE
Leukocytes, UA: NEGATIVE
NITRITE UA: NEGATIVE
PH UA: 6
Protein, UA: NEGATIVE
RBC UA: NEGATIVE
UROBILINOGEN UA: NEGATIVE

## 2016-08-28 LAB — LIPID PANEL
CHOL/HDL RATIO: 5.7 ratio — AB (ref <5.0)
CHOLESTEROL: 204 mg/dL — AB (ref <200)
HDL: 36 mg/dL — ABNORMAL LOW (ref >40)
LDL Cholesterol: 124 mg/dL — ABNORMAL HIGH (ref <100)
Triglycerides: 222 mg/dL — ABNORMAL HIGH (ref <150)
VLDL: 44 mg/dL — AB (ref <30)

## 2016-08-28 LAB — BASIC METABOLIC PANEL
BUN: 11 mg/dL (ref 7–25)
CALCIUM: 9.8 mg/dL (ref 8.6–10.3)
CO2: 25 mmol/L (ref 20–31)
CREATININE: 1.05 mg/dL (ref 0.60–1.35)
Chloride: 104 mmol/L (ref 98–110)
Glucose, Bld: 96 mg/dL (ref 65–99)
Potassium: 3.9 mmol/L (ref 3.5–5.3)
Sodium: 138 mmol/L (ref 135–146)

## 2016-08-28 LAB — CBC WITH DIFFERENTIAL/PLATELET
BASOS ABS: 0 {cells}/uL (ref 0–200)
Basophils Relative: 0 %
EOS ABS: 66 {cells}/uL (ref 15–500)
Eosinophils Relative: 1 %
HEMATOCRIT: 42 % (ref 38.5–50.0)
Hemoglobin: 13.7 g/dL (ref 13.2–17.1)
LYMPHS PCT: 35 %
Lymphs Abs: 2310 cells/uL (ref 850–3900)
MCH: 26.6 pg — AB (ref 27.0–33.0)
MCHC: 32.6 g/dL (ref 32.0–36.0)
MCV: 81.4 fL (ref 80.0–100.0)
MONOS PCT: 8 %
MPV: 9.1 fL (ref 7.5–12.5)
Monocytes Absolute: 528 cells/uL (ref 200–950)
NEUTROS PCT: 56 %
Neutro Abs: 3696 cells/uL (ref 1500–7800)
PLATELETS: 399 10*3/uL (ref 140–400)
RBC: 5.16 MIL/uL (ref 4.20–5.80)
RDW: 14.6 % (ref 11.0–15.0)
WBC: 6.6 10*3/uL (ref 4.0–10.5)

## 2016-08-28 LAB — TSH: TSH: 0.59 mIU/L (ref 0.40–4.50)

## 2016-08-28 NOTE — Progress Notes (Signed)
Subjective:    Patient ID: Roy Jennings, male    DOB: 1980/12/11, 36 y.o.   MRN: [id number]  HPI Chief Complaint  Patient presents with  . cpe    cpe and fasting. had eye checked on 08/27/16   He is here for a complete physical exam. Has no concerns or complaints today.  Last CPE: years ago  He is aware that he is obese by his BMI and is making efforts to lower this.  States he is not taking citalopram for anxiety. States he is no longer having any issues with anxiety and is not depressed. States he is doing well.  I have been managing his diabetes and saw him last week for a diabetes follow up.   Other providers: Dr. Lucianne MussKumar Endocrinologist- has appointment next week.   Social history: Lives with wife and children, works as a Equities tradermental health tech.  Denies smoking, drinking alcohol, drug use Diet: making healthier choices and has cut out sugary drinks. He asked a lot of questions today regarding healthy foods Exercise: he is walking 10,000 steps per day. States he did this already today.   Immunizations: refuses flu shot. Tdap up to date.   Health maintenance:  Colonoscopy: N/A Last PSA: N/A Last Dental Exam: twice annually  Last Eye Exam: yesterday. No retinopathy.   Wears seatbelt always,  smoke detectors in home and functioning, does not text while driving, feels safe in home environment.  Reviewed allergies, medications, past medical, surgical, family, and social history.    Review of Systems Review of Systems Constitutional: -fever, -chills, -sweats, -unexpected weight change,-fatigue ENT: -runny nose, -ear pain, -sore throat Cardiology:  -chest pain, -palpitations, -edema Respiratory: -cough, -shortness of breath, -wheezing Gastroenterology: -abdominal pain, -nausea, -vomiting, -diarrhea, -constipation  Hematology: -bleeding or bruising problems Musculoskeletal: -arthralgias, -myalgias, -joint swelling, -back pain Ophthalmology: -vision changes Urology: -dysuria,  -difficulty urinating, -hematuria, -urinary frequency, -urgency Neurology: -headache, -weakness, -tingling, -numbness       Objective:   Physical Exam BP 120/80   Pulse 85   Ht 5' 11.75" (1.822 m)   Wt 285 lb 12.8 oz (129.6 kg)   BMI 39.03 kg/m   General Appearance:    Alert, cooperative, no distress, appears stated age  Head:    Normocephalic, without obvious abnormality, atraumatic  Eyes:    PERRL, conjunctiva/corneas clear, EOM's intact, fundi    benign  Ears:    Normal TM's and external ear canals  Nose:   Nares normal, mucosa normal, no drainage or sinus   tenderness  Throat:   Lips, mucosa, and tongue normal; teeth and gums normal  Neck:   Supple, no lymphadenopathy;  thyroid:  no   enlargement/tenderness/nodules; no carotid   bruit or JVD  Back:    Spine nontender, no curvature, ROM normal, no CVA     tenderness  Lungs:     Clear to auscultation bilaterally without wheezes, rales or     ronchi; respirations unlabored  Chest Wall:    No tenderness or deformity   Heart:    Regular rate and rhythm, S1 and S2 normal, no murmur, rub   or gallop  Breast Exam:    No chest wall tenderness, masses or gynecomastia  Abdomen:     Soft, non-tender, nondistended, normoactive bowel sounds,    no masses, no hepatosplenomegaly  Genitalia:    Normal male external genitalia without lesions.  Testicles without masses.  No inguinal hernias.  Rectal:   Deferred due to age <40 and  lack of symptoms  Extremities:   No clubbing, cyanosis or edema  Pulses:   2+ and symmetric all extremities  Skin:   Skin color, texture, turgor normal, no rashes or lesions  Lymph nodes:   Cervical, supraclavicular, and axillary nodes normal  Neurologic:   CNII-XII intact, normal strength, sensation and gait; reflexes 2+ and symmetric throughout          Psych:   Normal mood, affect, hygiene and grooming.     Urinalysis dipstick: glucose 1+      Assessment & Plan:  Routine general medical examination at a  health care facility - Plan: CBC with Differential/Platelet, Basic metabolic panel, POCT urinalysis dipstick, TSH  Obesity, Class II, BMI 35-39.9 - Plan: TSH  Hypercholesteremia - Plan: Lipid panel  Counseled on healthy lifestyle with diet and exercise. Discussed eating more salads with light dressing, baked or grilled meats, steamed vegetables and drinking more water. He has stopped drinking sugary drinks and is making a real effort to eat better and to exercise. He appears interested in going back to the nutritionist and will check on this through [employer] since he is an employee.  He will go for his first visit with Dr. Lucianne Muss next week to try to get his diabetes under better control. 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 Will appreciate help with this.  Discussed how obesity contributes to diabetes and other chronic health conditions.  He is fasting and I will checking lipids today.  Discussed health promotion.  Advised on importance of self testicular exams since he refused a genital exam today.  Declines STD testing. Reports being in a monogamous relationship with his wife.  Refuses flu shot.  Discussed that I am turning management of his diabetes over to Dr. Lucianne Muss. He would still like to return in 3 months to our office for follow up on weight and I am ok with this.

## 2016-08-28 NOTE — Patient Instructions (Signed)
Preventative Care for Adults, Male       REGULAR HEALTH EXAMS:  A routine yearly physical is a good way to check in with your primary care provider about your health and preventive screening. It is also an opportunity to share updates about your health and any concerns you have, and receive a thorough all-over exam.   Most health insurance companies pay for at least some preventative services.  Check with your health plan for specific coverages.  WHAT PREVENTATIVE SERVICES DO MEN NEED?  Adult men should have their weight and blood pressure checked regularly.   Men age 35 and older should have their cholesterol levels checked regularly.  Beginning at age 50 and continuing to age 75, men should be screened for colorectal cancer.  Certain people should may need continued testing until age 85.  Other cancer screening may include exams for testicular and prostate cancer.  Updating vaccinations is part of preventative care.  Vaccinations help protect against diseases such as the flu.  Lab tests are generally done as part of preventative care to screen for anemia and blood disorders, to screen for problems with the kidneys and liver, to screen for bladder problems, to check blood sugar, and to check your cholesterol level.  Preventative services generally include counseling about diet, exercise, avoiding tobacco, drugs, excessive alcohol consumption, and sexually transmitted infections.    GENERAL RECOMMENDATIONS FOR GOOD HEALTH:  Healthy diet:  Eat a variety of foods, including fruit, vegetables, animal or vegetable protein, such as meat, fish, chicken, and eggs, or beans, lentils, tofu, and grains, such as rice.  Drink plenty of water daily.  Decrease saturated fat in the diet, avoid lots of red meat, processed foods, sweets, fast foods, and fried foods.  Exercise:  Aerobic exercise helps maintain good heart health. At least 30-40 minutes of moderate-intensity exercise is recommended.  For example, a brisk walk that increases your heart rate and breathing. This should be done on most days of the week.   Find a type of exercise or a variety of exercises that you enjoy so that it becomes a part of your daily life.  Examples are running, walking, swimming, water aerobics, and biking.  For motivation and support, explore group exercise such as aerobic class, spin class, Zumba, Yoga,or  martial arts, etc.    Set exercise goals for yourself, such as a certain weight goal, walk or run in a race such as a 5k walk/run.  Speak to your primary care provider about exercise goals.  Disease prevention:  If you smoke or chew tobacco, find out from your caregiver how to quit. It can literally save your life, no matter how long you have been a tobacco user. If you do not use tobacco, never begin.   Maintain a healthy diet and normal weight. Increased weight leads to problems with blood pressure and diabetes.   The Body Mass Index or BMI is a way of measuring how much of your body is fat. Having a BMI above 27 increases the risk of heart disease, diabetes, hypertension, stroke and other problems related to obesity. Your caregiver can help determine your BMI and based on it develop an exercise and dietary program to help you achieve or maintain this important measurement at a healthful level.  High blood pressure causes heart and blood vessel problems.  Persistent high blood pressure should be treated with medicine if weight loss and exercise do not work.   Fat and cholesterol leaves deposits in your arteries   that can block them. This causes heart disease and vessel disease elsewhere in your body.  If your cholesterol is found to be high, or if you have heart disease or certain other medical conditions, then you may need to have your cholesterol monitored frequently and be treated with medication.   Ask if you should have a stress test if your history suggests this. A stress test is a test done on  a treadmill that looks for heart disease. This test can find disease prior to there being a problem.  Avoid drinking alcohol in excess (more than two drinks per day).  Avoid use of street drugs. Do not share needles with anyone. Ask for professional help if you need assistance or instructions on stopping the use of alcohol, cigarettes, and/or drugs.  Brush your teeth twice a day with fluoride toothpaste, and floss once a day. Good oral hygiene prevents tooth decay and gum disease. The problems can be painful, unattractive, and can cause other health problems. Visit your dentist for a routine oral and dental check up and preventive care every 6-12 months.   Look at your skin regularly.  Use a mirror to look at your back. Notify your caregivers of changes in moles, especially if there are changes in shapes, colors, a size larger than a pencil eraser, an irregular border, or development of new moles.  Safety:  Use seatbelts 100% of the time, whether driving or as a passenger.  Use safety devices such as hearing protection if you work in environments with loud noise or significant background noise.  Use safety glasses when doing any work that could send debris in to the eyes.  Use a helmet if you ride a bike or motorcycle.  Use appropriate safety gear for contact sports.  Talk to your caregiver about gun safety.  Use sunscreen with a SPF (or skin protection factor) of 15 or greater.  Lighter skinned people are at a greater risk of skin cancer. Don't forget to also wear sunglasses in order to protect your eyes from too much damaging sunlight. Damaging sunlight can accelerate cataract formation.   Practice safe sex. Use condoms. Condoms are used for birth control and to help reduce the spread of sexually transmitted infections (or STIs).  Some of the STIs are gonorrhea (the clap), chlamydia, syphilis, trichomonas, herpes, HPV (human papilloma virus) and HIV (human immunodeficiency virus) which causes AIDS.  The herpes, HIV and HPV are viral illnesses that have no cure. These can result in disability, cancer and death.   Keep carbon monoxide and smoke detectors in your home functioning at all times. Change the batteries every 6 months or use a model that plugs into the wall.   Vaccinations:  Stay up to date with your tetanus shots and other required immunizations. You should have a booster for tetanus every 10 years. Be sure to get your flu shot every year, since 5%-20% of the [place] population comes down with the flu. The flu vaccine changes each year, so being vaccinated once is not enough. Get your shot in the fall, before the flu season peaks.   Other vaccines to consider:  Pneumococcal vaccine to protect against certain types of pneumonia.  This is normally recommended for adults age 65 or older.  However, adults younger than 37 years old with certain underlying conditions such as diabetes, heart or lung disease should also receive the vaccine.  Shingles vaccine to protect against Varicella Zoster if you are older than age 60, or younger   than 37 years old with certain underlying illness.  Hepatitis A vaccine to protect against a form of infection of the liver by a virus acquired from food.  Hepatitis B vaccine to protect against a form of infection of the liver by a virus acquired from blood or body fluids, particularly if you work in health care.  If you plan to travel internationally, check with your local health department for specific vaccination recommendations.  Cancer Screening:  Most routine colon cancer screening begins at the age of 50. On a yearly basis, doctors may provide special easy to use take-home tests to check for hidden blood in the stool. Sigmoidoscopy or colonoscopy can detect the earliest forms of colon cancer and is life saving. These tests use a small camera at the end of a tube to directly examine the colon. Speak to your caregiver about this at age 50, when routine  screening begins (and is repeated every 5 years unless early forms of pre-cancerous polyps or small growths are found).   At the age of 50 men usually start screening for prostate cancer every year. Screening may begin at a younger age for those with higher risk. Those at higher risk include African-Americans or having a family history of prostate cancer. There are two types of tests for prostate cancer:   Prostate-specific antigen (PSA) testing. Recent studies raise questions about prostate cancer using PSA and you should discuss this with your caregiver.   Digital rectal exam (in which your doctor's lubricated and gloved finger feels for enlargement of the prostate through the anus).   Screening for testicular cancer.  Do a monthly exam of your testicles. Gently roll each testicle between your thumb and fingers, feeling for any abnormal lumps. The best time to do this is after a hot shower or bath when the tissues are looser. Notify your caregivers of any lumps, tenderness or changes in size or shape immediately.     

## 2016-08-30 NOTE — Progress Notes (Signed)
Patient ID: Roy Jennings, male   DOB: 24-Mar-1981, 36 y.o.   MRN: [id number]            Reason for Appointment: Consultation for Type 2 Diabetes   History of Present Illness:          Date of diagnosis of type 2 diabetes mellitus: 12/2011       Background history:   He was initially diagnosed with marked hyperglycemia and A1c of 13.4% He was apparently started on insulin at onset and has taken various insulin regimens in the past He has had difficulty tolerating metformin and has not taken this most of the time He has probably been on Lantus since about 08/2015, previously on the 70/30 insulin His level of control had improved initially in 2014 and 2015  Recent history:   INSULIN regimen is: 55-60 Lantus     Non-insulin hypoglycemic drugs the patient is taking are: Metformin ER 500 mg  Current management, blood sugar patterns and problems identified:  Overall his blood sugars have been more difficult to control since late 2016 when his A1c went up to 11.2  Apparently prior to that he had been followed with diabetes education and was overall doing well  However he has had irregular follow-up   and blood sugars appear to be consistently high over the last year  Because of his blood sugars being much higher in 12/17 and lab glucose 496 his insulin dose was increased and he was started on metformin, Trulicity and Jardiance 10 mg  Even with increasing his insulin his blood sugars have been still mostly high although he thinks they are better at suppertime  He does not monitor his readings after meals   Although he has been to the dietitian in 1/17 he does not follow his diet and his frequently eating out at fast food restaurants; he thinks he tends to get more hungry in the afternoon  Recently has been trying to walk 1-1-1/2 hours at least 4 days a week in the mornings        Side effects from medications have been: Diarrhea from higher doses of metformin  Compliance with the  medical regimen: Poor  Hypoglycemia:   none  Glucose monitoring:  done 0-1 times a day         Glucometer: Contour   Blood Glucose readings by recall:   PREMEAL Breakfast Lunch Dinner Bedtime  Overall   Glucose range: 180-200  150-160    Median:        Self-care: The diet that the patient has been following SM:OLMB    Typical meal intake: Breakfast is fruit               Dietician visit, most recent: 08/2015               Exercise: 10k   Weight history:   Wt Readings from Last 3 Encounters:  08/31/16 293 lb (132.9 kg)  08/28/16 285 lb 12.8 oz (129.6 kg)  08/21/16 291 lb 6.4 oz (132.2 kg)    Glycemic control:   Lab Results  Component Value Date   HGBA1C >14.0 07/16/2016   HGBA1C 10.6 02/07/2016   HGBA1C 11.2 07/30/2015   Lab Results  Component Value Date   MICROALBUR 0.3 07/16/2016   LDLCALC 124 (H) 08/28/2016   CREATININE 1.05 08/28/2016   Lab Results  Component Value Date   MICRALBCREAT 6 07/16/2016       Allergies as of 08/31/2016  Reactions   Metformin And Related    Diarrhea, nausea      Medication List       Accurate as of 08/31/16  5:08 PM. Always use your most recent med list.          aspirin EC 81 MG tablet Take 81 mg by mouth daily. Reported on 10/01/2015   Dulaglutide 0.75 MG/0.5ML Sopn Commonly known as:  TRULICITY Inject 1 device weekly.   empagliflozin 10 MG Tabs tablet Commonly known as:  JARDIANCE Take 10 mg by mouth daily.   glucose blood test strip Commonly known as:  BAYER CONTOUR NEXT TEST Test twice a day   LANTUS SOLOSTAR 100 UNIT/ML Solostar Pen Generic drug:  Insulin Glargine INJECT 35 UNITS INTO THE SKIN DAILY   metFORMIN 500 MG 24 hr tablet Commonly known as:  GLUCOPHAGE XR Take 1 tablet (500 mg total) by mouth daily with breakfast.   MICROLET LANCETS Misc Test twice a day       Allergies:  Allergies  Allergen Reactions  . Metformin And Related     Diarrhea, nausea    Past Medical History:    Diagnosis Date  . Diabetes mellitus 12/30/11  . DKA (diabetic ketoacidoses) ([place]) 12/30/11   new dx DM a1c 13  . PVC (premature ventricular contraction)    LBB inferior axis QRS // followed by [name]  . Right bundle branch block     Past Surgical History:  Procedure Laterality Date  . KNEE ARTHROSCOPY  1999-2000   bilateral    Family History  Problem Relation Age of Onset  . Diabetes Father   . Diabetes Maternal Grandmother   . Hypertension Mother     Social History:  reports that he has never smoked. He has never used smokeless tobacco. He reports that he drinks alcohol. He reports that he does not use drugs.   Review of Systems  Eyes: Negative for blurred vision.  Respiratory: Negative for shortness of breath.   Cardiovascular: Negative for leg swelling and claudication.  Gastrointestinal: Negative for diarrhea.  Neurological: Negative for numbness.       His feet may burn at times     Lipid history: His lipids are higher than before.  Has not been told to take any medications after his labs last week     Lab Results  Component Value Date   CHOL 204 (H) 08/28/2016   HDL 36 (L) 08/28/2016   LDLCALC 124 (H) 08/28/2016   TRIG 222 (H) 08/28/2016   CHOLHDL 5.7 (H) 08/28/2016           Hypertension:Not present  Most recent eye exam was 1/18  Most recent foot exam: 1/18    LABS:  Abstract on 08/31/2016  Component Date Value Ref Range Status  . HM Diabetic Eye Exam 08/27/2016 No Retinopathy  No Retinopathy Final  Office Visit on 08/28/2016  Component Date Value Ref Range Status  . WBC 08/28/2016 6.6  4.0 - 10.5 K/uL Final  . RBC 08/28/2016 5.16  4.20 - 5.80 MIL/uL Final  . Hemoglobin 08/28/2016 13.7  13.2 - 17.1 g/dL Final  . HCT 08/28/2016 42.0  38.5 - 50.0 % Final  . MCV 08/28/2016 81.4  80.0 - 100.0 fL Final  . MCH 08/28/2016 26.6* 27.0 - 33.0 pg Final  . MCHC 08/28/2016 32.6  32.0 - 36.0 g/dL Final  . RDW 08/28/2016 14.6  11.0 - 15.0 %  Final  . Platelets 08/28/2016 399  140 - 400 K/uL  Final  . MPV 08/28/2016 9.1  7.5 - 12.5 fL Final  . Neutro Abs 08/28/2016 3696  1,500 - 7,800 cells/uL Final  . Lymphs Abs 08/28/2016 2310  850 - 3,900 cells/uL Final  . Monocytes Absolute 08/28/2016 528  200 - 950 cells/uL Final  . Eosinophils Absolute 08/28/2016 66  15 - 500 cells/uL Final  . Basophils Absolute 08/28/2016 0  0 - 200 cells/uL Final  . Neutrophils Relative % 08/28/2016 56  % Final  . Lymphocytes Relative 08/28/2016 35  % Final  . Monocytes Relative 08/28/2016 8  % Final  . Eosinophils Relative 08/28/2016 1  % Final  . Basophils Relative 08/28/2016 0  % Final  . Smear Review 08/28/2016 Criteria for review not met   Final  . Sodium 08/28/2016 138  135 - 146 mmol/L Final  . Potassium 08/28/2016 3.9  3.5 - 5.3 mmol/L Final  . Chloride 08/28/2016 104  98 - 110 mmol/L Final  . CO2 08/28/2016 25  20 - 31 mmol/L Final  . Glucose, Bld 08/28/2016 96  65 - 99 mg/dL Final  . BUN 08/28/2016 11  7 - 25 mg/dL Final  . Creat 08/28/2016 1.05  0.60 - 1.35 mg/dL Final  . Calcium 08/28/2016 9.8  8.6 - 10.3 mg/dL Final  . Color, UA 08/28/2016 yellow   Final  . Clarity, UA 08/28/2016 clear   Final  . Glucose, UA 08/28/2016 1+   Final  . Bilirubin, UA 08/28/2016 n   Final  . Ketones, UA 08/28/2016 n   Final  . Spec Grav, UA 08/28/2016 >=1.030   Final  . Blood, UA 08/28/2016 n   Final  . pH, UA 08/28/2016 6.0   Final  . Protein, UA 08/28/2016 n   Final  . Urobilinogen, UA 08/28/2016 negative   Final  . Nitrite, UA 08/28/2016 n   Final  . Leukocytes, UA 08/28/2016 Negative  Negative Final  . TSH 08/28/2016 0.59  0.40 - 4.50 mIU/L Final  . Cholesterol 08/28/2016 204* <200 mg/dL Final  . Triglycerides 08/28/2016 222* <150 mg/dL Final  . HDL 08/28/2016 36* >40 mg/dL Final  . Total CHOL/HDL Ratio 08/28/2016 5.7* <5.0 Ratio Final  . VLDL 08/28/2016 44* <30 mg/dL Final  . LDL Cholesterol 08/28/2016 124* <100 mg/dL Final    Physical  Examination:  BP 128/82   Pulse (!) 106   Ht 6' (1.829 m)   Wt 293 lb (132.9 kg)   SpO2 96%   BMI 39.74 kg/m   No lower leg edema present    ASSESSMENT:  Diabetes type 2, uncontrolled   Problems identified:  Inadequate compliance with diet, getting more fast food and high carbohydrate or high-fat meals and snacks  Tendency to increased hunger especially in the afternoons   Difficulty losing weight.  Some degree of insulin resistance with blood sugars still relatively high with 60 units of insulin  Probable post prandial hyperglycemia, currently not monitoring this  Inadequate doses of non-insulin drugs including metformin, Jardiance and Trulicity  Needs increased level of motivation to check blood sugar, followed diet and regular follow-up   Complications of diabetes: Mild symptoms of neuropathy  HYPERLIPIDEMIA: He should be on a statin drug, but defer to PCP  PLAN:    He needs to get back into the diabetes education program which is sponsored by his wife's work  He needs to avoid fast food restaurants and cut back on high-fat and high carbohydrate intake  Will increase his Trulicity up to 1.5 mg  weekly, he can use up current supply with taking 2 injections at a time  Also advised him to rotate injection sites to the softer areas of his abdomen instead of near the rib cage.  He will increase Jardiance to 20 mg in the mornings and neck prescription will need to be 25 mg  Change Lantus to the evening and you 65 units for now  Start monitoring readings after meals to help identify postprandial patterns and also may help him better understand the effects of various foods  If he continues to have significant postprandial hyperglycemia and may need mealtime insulin  Follow-up in 4 weeks and will need repeat BMP   Patient Instructions  Check blood sugars on waking up 3x weekly   Also check blood sugars about 2 hours after a meal and do this after different meals  by rotation  Recommended blood sugar levels on waking up is 90-130 and about 2 hours after meal is 130-160  Please bring your blood sugar monitor to each visit, thank you  LANTUS 65 IN PM  Trulicty 2 at a time on Frday  Jardiance 2 in am daily  No fast food!!  Enrol in Wellness  Counseling time on subjects discussed above is over 50% of today's 25 minute visit    Solina Heron 08/31/2016, 5:08 PM   Note: This office note was prepared with Dragon voice recognition system technology. Any transcriptional errors that result from this process are unintentional.

## 2016-08-31 ENCOUNTER — Other Ambulatory Visit: Payer: Self-pay | Admitting: Family Medicine

## 2016-08-31 ENCOUNTER — Encounter: Payer: Self-pay | Admitting: Family Medicine

## 2016-08-31 ENCOUNTER — Encounter: Payer: Self-pay | Admitting: Endocrinology

## 2016-08-31 ENCOUNTER — Ambulatory Visit (INDEPENDENT_AMBULATORY_CARE_PROVIDER_SITE_OTHER): Payer: 59 | Admitting: Endocrinology

## 2016-08-31 VITALS — BP 128/82 | HR 106 | Ht 72.0 in | Wt 293.0 lb

## 2016-08-31 DIAGNOSIS — Z794 Long term (current) use of insulin: Secondary | ICD-10-CM | POA: Diagnosis not present

## 2016-08-31 DIAGNOSIS — E1165 Type 2 diabetes mellitus with hyperglycemia: Secondary | ICD-10-CM

## 2016-08-31 NOTE — Patient Instructions (Addendum)
Check blood sugars on waking up 3x weekly   Also check blood sugars about 2 hours after a meal and do this after different meals by rotation  Recommended blood sugar levels on waking up is 90-130 and about 2 hours after meal is 130-160  Please bring your blood sugar monitor to each visit, thank you  LANTUS 65 IN PM  Trulicty 2 at a time on Frday  Jardiance 2 in am daily  No fast food!!  Enrol in Wellness

## 2016-09-01 ENCOUNTER — Other Ambulatory Visit: Payer: Self-pay | Admitting: Family Medicine

## 2016-09-01 ENCOUNTER — Telehealth: Payer: Self-pay | Admitting: Internal Medicine

## 2016-09-01 MED ORDER — ATORVASTATIN CALCIUM 20 MG PO TABS: 20.0000 mg | ORAL_TABLET | Freq: Every day | ORAL | 2 refills | Status: DC

## 2016-09-01 NOTE — Progress Notes (Signed)
Please call him and let him know that I recommend he start taking a medication to lower his cholesterol. I will send this to his pharmacy. If he has any questions he can call or come in.

## 2016-09-01 NOTE — Telephone Encounter (Signed)
-----   Message from Avanell ShackletonVickie L Henson, NP sent at 09/01/2016 10:23 AM EST -----   ----- Message ----- From: Reather LittlerAjay Kumar, MD Sent: 08/31/2016   9:31 PM To: Avanell ShackletonVickie L Henson, NP

## 2016-09-01 NOTE — Telephone Encounter (Signed)
Left detailed message about med sent to pharmacy for cholesterol

## 2016-09-24 ENCOUNTER — Other Ambulatory Visit: Payer: Self-pay | Admitting: Medical

## 2016-09-24 MED FILL — TRULICITY 0.75 MG/0.5 ML PE: 0.75 | 28 days supply | Qty: 2 | Fill #1

## 2016-09-24 MED FILL — METFORMIN HCL ER 500 MG TAB: 500 | 30 days supply | Qty: 30 | Fill #2

## 2016-09-28 ENCOUNTER — Other Ambulatory Visit: Payer: 59

## 2016-10-01 ENCOUNTER — Ambulatory Visit: Payer: 59 | Admitting: Endocrinology

## 2016-10-07 ENCOUNTER — Other Ambulatory Visit: Payer: Self-pay

## 2016-10-07 ENCOUNTER — Telehealth: Payer: Self-pay | Admitting: Family Medicine

## 2016-10-07 VITALS — BP 120/72 | HR 100 | Resp 16 | Ht 71.0 in | Wt 285.4 lb

## 2016-10-07 DIAGNOSIS — E1165 Type 2 diabetes mellitus with hyperglycemia: Principal | ICD-10-CM

## 2016-10-07 DIAGNOSIS — IMO0001 Reserved for inherently not codable concepts without codable children: Secondary | ICD-10-CM

## 2016-10-07 DIAGNOSIS — Z794 Long term (current) use of insulin: Principal | ICD-10-CM

## 2016-10-07 NOTE — Telephone Encounter (Signed)
Called and left detailed message that pt need to get this from endocrinology not us

## 2016-10-07 NOTE — Patient Outreach (Signed)
[facility]) Care Management   10/07/2016  Roy FewJames R Jennings 1981-03-08 [id number]  Roy FewJames R Jennings is an 36 y.o. male.   Member seen for follow up office visit for Link to Wellness program for self management of Type 2 diabetes  Subjective:  Member states that he saw the endocrinologist in January and he made changes to his medications.  States his blood sugars were up over the holidays.  States he is trying to take his medications as ordered.  States he is still eating fast food for lunch.  States his stress at work is improved.  States he his blood sugars have been better but he is only checking every Jennings days   Objective:   Review of Systems  All other systems reviewed and are negative.   POC HemoglobinA1C-8.2% Physical Exam Today's Vitals   10/07/16 0859 10/07/16 0905  BP: 120/72   Pulse: 100   Resp: 16   SpO2: 95%   Weight: 285 lb 6.4 oz (129.5 kg)   Height: 1.803 m (5\' 11" )   PainSc: 0-No pain 0-No pain   Encounter Medications:   Outpatient Encounter Prescriptions as of 10/07/2016  Medication Sig  . aspirin EC 81 MG tablet Take 81 mg by mouth daily. Reported on 10/01/2015  . atorvastatin (LIPITOR) 20 MG tablet Take 1 tablet (20 mg total) by mouth daily.  . Dulaglutide (TRULICITY) 0.75 MG/0.5ML SOPN Inject 1 device weekly. (Patient taking differently: Inject 2 device weekly.)  . glucose blood (BAYER CONTOUR NEXT TEST) test strip Test twice a day  . metFORMIN (GLUCOPHAGE XR) 500 MG 24 hr tablet Take 1 tablet (500 mg total) by mouth daily with breakfast.  . MICROLET LANCETS MISC Test twice a day  . empagliflozin (JARDIANCE) 10 MG TABS tablet Take 10 mg by mouth daily. (Patient taking differently: Take 20 mg by mouth daily. )  . LANTUS SOLOSTAR 100 UNIT/ML Solostar Pen INJECT 35 UNITS INTO THE SKIN DAILY (Patient taking differently: INJECT 65 UNITS INTO THE SKIN DAILY)   No facility-administered encounter medications on file as of 10/07/2016.     Functional Status:    In your present state of health, do you have any difficulty performing the following activities: 10/07/2016 01/20/2016  Hearing? N N  Vision? N N  Difficulty concentrating or making decisions? N N  Walking or climbing stairs? N N  Dressing or bathing? N N  Doing errands, shopping? N N  Some recent data might be hidden    Fall/Depression Screening:    PHQ 2/9 Scores 10/07/2016 01/20/2016 11/25/2015 10/05/2015 07/30/2015 06/18/2014 02/20/2014  PHQ - 2 Score 0 0 0 0 0 0 0    Assessment:  Member seen for follow up office visit for Link to Wellness program for self management of Type 2 diabetes. Member is not meeting diabetes self management goal of hemoglobin A1C of 7% or below with last reading of >14%%. Member has since gone to see endocrinologist.  POC hemoglobin A1C decreased to 8.2% today.  Member reports taking medications as directed.  Reports increase exercising 5-6 times a week. Member trying to watch CHO portions but reports eating fast food for lunch most days.  Reports checking CBGs 2-3 times a week ranging 116-250. Member is up to date with annual eye exam.   Plan:  Plan to eat 45-60 GM (3-4) servings of carbohydrate a meal and 15 GM for snacks.  Plan to eat protein with your snacks Plan to check blood sugar twice a day fasting  or 1 -2hrs after a meal.  Goals of 80-130 fasting and 180 or less after eating. Plan to pack your lunch 3 times a week Plan to try to not drink sweet tea Plan to walk 5-6 times a week for 60 minutes.   Plan to complete EMMI programs by 12/01/16 Plan to call Dr. Lucianne Muss to schedule follow up appt. Plan to return to Link to Wellness on 11/18/16 at 9 AM  [facility] CM Care Plan Problem One   Flowsheet Row Most Recent Value  Care Plan Problem One  Elevated blood sugars as evidenced by hemoglobin A1C >14% related to dx of Type 2 DM  Role Documenting the Problem One  Care Management Coordinator  Care Plan for Problem One  Active  [facility] Long Term Goal (31-90 days)  Member  will decrease hemoglobin A1C by 1 point in the next 90 days  [facility] Long Term Goal Start Date  10/07/16 Maurice Small hemoglobin A1C >14%]  Interventions for Problem One Long Term Goal  Reinforced on CHO counting and portion control, Given handouts on CHO counting, Encouraged to avoid fast food and to try to take his lunch to work instead of eating fast food,  Reinforced instructions on Type 2 DM and insulin resistance, Reviewed medication changes made by Dr. Lucianne Muss abnd instructed on how each medication lowers blood sugars, Instructed to call and schedule follow up appt with Dr. Lucianne Muss, Reinforced to check his blood sugars twice a day, Reinforced  blood sugar goals and how they relate to his hemoglobin A1C reading,  Encourged to continue to exercise daily and to do weight training twice a week, Reassigned EMMI education programs    Dudley Major RN, [facility] Care Management Coordinator-Link to Wellness [facility] Care Management [phone]

## 2016-10-07 NOTE — Patient Instructions (Signed)
1. Plan to eat 45-60 GM (3-4) servings of carbohydrate a meal and 15 GM for snacks.  Plan to eat protein with your snacks 2. Plan to check blood sugar twice a day fasting or 1 -2hrs after a meal.  Goals of 80-130 fasting and 180 or less after eating. 3. Plan to pack your lunch 3 times a week 4. Plan to try to not drink sweet tea 5. Plan to walk 5-6 times a week for 60 minutes.   6. Plan to complete EMMI programs by 12/01/16 7. Plan to call Dr. Lucianne MussKumar to schedule follow up appt. 8. Plan to return to Link to Wellness on 11/18/16 at 9 AM

## 2016-10-07 NOTE — Telephone Encounter (Signed)
pts wife called and left message stating that he needs a  Refill on his lantus solostar pen pt [facility] [place] [address]

## 2016-10-14 ENCOUNTER — Telehealth: Payer: Self-pay | Admitting: Endocrinology

## 2016-10-14 ENCOUNTER — Other Ambulatory Visit: Payer: Self-pay

## 2016-10-14 DIAGNOSIS — E1165 Type 2 diabetes mellitus with hyperglycemia: Principal | ICD-10-CM

## 2016-10-14 DIAGNOSIS — IMO0001 Reserved for inherently not codable concepts without codable children: Secondary | ICD-10-CM

## 2016-10-14 MED ORDER — DULAGLUTIDE 1.5 MG/0.5ML ~~LOC~~ SOAJ: SUBCUTANEOUS | 3 refills | Status: DC

## 2016-10-14 MED ORDER — INSULIN GLARGINE 100 UNIT/ML SOLOSTAR PEN: PEN_INJECTOR | SUBCUTANEOUS | 2 refills | Status: DC

## 2016-10-14 MED FILL — LANTUS SOLOSTAR 100 UNITS/M: 100 | 23 days supply | Qty: 15 | Fill #0

## 2016-10-14 NOTE — Telephone Encounter (Signed)
-----   Message from Reather LittlerAjay Kumar, MD sent at 10/14/2016  1:08 PM EDT ----- He canceled his appointment and needs to reschedule his follow-up for diabetes.  Cannot prescribe any further medications without follow-up

## 2016-10-14 NOTE — Telephone Encounter (Signed)
LM for pt to call to schedule a fu with labs

## 2016-10-14 NOTE — Telephone Encounter (Signed)
Refill  Dulaglutide (TRULICITY) 0.75 MG/0.5ML SOPN  LANTUS SOLOSTAR 100 UNIT/ML Solostar Pen  [facility] [place] - [address] [phone] (Phone) [phone] (Fax)

## 2016-10-14 NOTE — Telephone Encounter (Signed)
Ordered

## 2016-10-16 MED FILL — TRULICITY 1.5 MG/0.5 ML PEN: 1.5 | 28 days supply | Qty: 2 | Fill #0

## 2016-11-18 ENCOUNTER — Ambulatory Visit: Payer: Self-pay

## 2016-11-19 ENCOUNTER — Other Ambulatory Visit: Payer: Self-pay

## 2016-11-19 ENCOUNTER — Other Ambulatory Visit: Payer: Self-pay | Admitting: Family Medicine

## 2016-11-19 DIAGNOSIS — E1165 Type 2 diabetes mellitus with hyperglycemia: Principal | ICD-10-CM

## 2016-11-19 DIAGNOSIS — IMO0001 Reserved for inherently not codable concepts without codable children: Secondary | ICD-10-CM

## 2016-11-19 LAB — POCT GLYCOSYLATED HEMOGLOBIN (HGB A1C): HEMOGLOBIN A1C: 8.2

## 2016-11-19 MED FILL — LANTUS SOLOSTAR 100 UNITS/M: 100 | 23 days supply | Qty: 15 | Fill #1

## 2016-11-19 MED FILL — TRULICITY 1.5 MG/0.5 ML PEN: 1.5 | 28 days supply | Qty: 2 | Fill #1

## 2016-11-19 MED FILL — ATORVASTATIN 20 MG TABLET: 20 | 30 days supply | Qty: 30 | Fill #0

## 2016-11-19 NOTE — Patient Outreach (Signed)
[facility]) Care Management   11/19/2016  Roy Jennings Jan 20, 1981 [id number]  Roy Jennings is an 36 y.o. male.   Member seen for follow up office visit for Link to Wellness program for self management of Type 2 diabetes  Subjective: Member states he is to see Dr. Lucianne Muss on 12/01/16.  States he is trying to eat better but he is still frequently eating fast food at lunch.  States he has packed his lunch some this last month.  States he is trying to check his blood sugars once a day and they range 114-220.  States he is walking at the [facility] 4-5 times a week or he gets in the pool to do water exercises.    Objective:   Review of Systems  All other systems reviewed and are negative.   Physical Exam Today's Vitals   11/19/16 0851 11/19/16 0901  BP: 128/84   Pulse: (!) 104   Resp: 16   SpO2: 95%   Weight: 289 lb 3.2 oz (131.2 kg)   Height: 1.803 m ( )   PainSc: 0-No pain 0-No pain   Encounter Medications:   Outpatient Encounter Prescriptions as of 11/19/2016  Medication Sig  . aspirin EC 81 MG tablet Take 81 mg by mouth daily. Reported on 10/01/2015  . atorvastatin (LIPITOR) 20 MG tablet Take 1 tablet (20 mg total) by mouth daily.  . Dulaglutide (TRULICITY) 1.5 MG/0.5ML SOPN Inject one pen  (1.5mg  total) weekly  . empagliflozin (JARDIANCE) 10 MG TABS tablet Take 10 mg by mouth daily. (Patient taking differently: Take 20 mg by mouth daily. )  . glucose blood (BAYER CONTOUR NEXT TEST) test strip Test twice a day  . Insulin Glargine (LANTUS SOLOSTAR) 100 UNIT/ML Solostar Pen INJECT 65 UNITS INTO THE SKIN DAILY  . metFORMIN (GLUCOPHAGE XR) 500 MG 24 hr tablet Take 1 tablet (500 mg total) by mouth daily with breakfast.  . MICROLET LANCETS MISC Test twice a day  . Dulaglutide (TRULICITY) 0.75 MG/0.5ML SOPN Inject 1 device weekly. (Patient not taking: Reported on 11/19/2016)   No facility-administered encounter medications on file as of 11/19/2016.     Functional  Status:   In your present state of health, do you have any difficulty performing the following activities: 11/19/2016 10/07/2016  Hearing? N N  Vision? N N  Difficulty concentrating or making decisions? N N  Walking or climbing stairs? N N  Dressing or bathing? N N  Doing errands, shopping? N N  Some recent data might be hidden    Fall/Depression Screening:    PHQ 2/9 Scores 11/19/2016 10/07/2016 01/20/2016 11/25/2015 10/05/2015 07/30/2015 06/18/2014  PHQ - 2 Score 0 0 0 0 0 0 0    Assessment:  Member seen for follow up office visit for Link to Wellness program for self management of Type 2 diabetes. Member is not meeting diabetes self management goal of hemoglobin A1C of 7% or below with last reading of 8.2%. Member now seeing endocrinologist. Member reports taking medications as directed.  Reports increase exercising 5-6 times a week. Member trying to watch CHO portions but reports eating fast food for lunch most days.  Reports checking daily ranging 116-250. Member is up to date with annual eye exam.    Plan:  1. Plan to eat 45-60 GM (3-4) servings of carbohydrate a meal and 15 GM for snacks.  Plan to eat protein with your snacks 2. Plan to check blood sugar twice a day fasting or 1 -2hrs  after a meal.  Goals of 80-130 fasting and 180 or less after eating. 3. Plan to pack your lunch 3 times a week 4. Plan to try to not drink sweet tea 5. Plan to walk 5-6 times a week for 60 minutes.   6. Plan to complete EMMI programs by 01/01/17 7. Plan to keep appt with Dr. Lucianne Muss on May 1 8. Plan to return to Link to Wellness on 01/07/17 at 9 AM  [facility] CM Care Plan Problem One     Most Recent Value  Care Plan Problem One  Elevated blood sugars as evidenced by hemoglobin A1C >14% related to dx of Type 2 DM  Role Documenting the Problem One  Care Management Coordinator  Care Plan for Problem One  Active  [facility] Long Term Goal (31-90 days)  Member will decrease hemoglobin A1C by 1 point in the next 90 days   [facility] Long Term Goal Start Date  10/07/16 Maurice Small hemoglobin A1C >14%]  Interventions for Problem One Long Term Goal  Reinforced on CHO counting and portion control, Given handout on Dining out with diabetes, Reinforced to avoid fast food and to try to take his lunch to work instead of eating fast food,  Reinforced how elevated blood sugars can cause complications all over his body and the importance of keeping his hemoglobin A1C below 7% , Instructed to keep scheduled follow up appt with Dr. Lucianne Muss, Reinforced to check his blood sugars twice a day, Reinforced  blood sugar goals and how they relate to his hemoglobin A1C reading,  Encourged to continue to exercise daily and to do weight training twice a week, Reassigned EMMI education programs     Dudley Major RN, [facility] Care Management Coordinator-Link to Wellness [facility] Care Management [phone]

## 2016-11-19 NOTE — Patient Instructions (Signed)
1. Plan to eat 45-60 GM (3-4) servings of carbohydrate a meal and 15 GM for snacks.  Plan to eat protein with your snacks 2. Plan to check blood sugar twice a day fasting or 1 -2hrs after a meal.  Goals of 80-130 fasting and 180 or less after eating. 3. Plan to pack your lunch 3 times a week 4. Plan to try to not drink sweet tea 5. Plan to walk 5-6 times a week for 60 minutes.   6. Plan to complete EMMI programs by 01/01/17 7. Plan to keep appt with Dr. Lucianne Muss on May 1 8. Plan to return to Link to Wellness on 01/07/17 at 9 AM

## 2016-11-25 ENCOUNTER — Other Ambulatory Visit: Payer: Self-pay

## 2016-11-25 MED ORDER — METFORMIN HCL ER 500 MG PO TB24: 500.0000 mg | ORAL_TABLET | Freq: Every day | ORAL | 2 refills | Status: DC

## 2016-11-25 MED ORDER — EMPAGLIFLOZIN 10 MG PO TABS: 10.0000 mg | ORAL_TABLET | Freq: Every day | ORAL | 2 refills | Status: DC

## 2016-11-25 MED FILL — METFORMIN HCL ER 500 MG TAB: 500 | 30 days supply | Qty: 30 | Fill #0

## 2016-11-25 MED FILL — JARDIANCE 10 MG TABLET: 10 | 30 days supply | Qty: 30 | Fill #0

## 2016-11-26 ENCOUNTER — Telehealth: Payer: Self-pay | Admitting: Endocrinology

## 2016-11-26 ENCOUNTER — Other Ambulatory Visit (INDEPENDENT_AMBULATORY_CARE_PROVIDER_SITE_OTHER): Payer: 59

## 2016-11-26 ENCOUNTER — Other Ambulatory Visit: Payer: Self-pay

## 2016-11-26 DIAGNOSIS — E1165 Type 2 diabetes mellitus with hyperglycemia: Secondary | ICD-10-CM

## 2016-11-26 DIAGNOSIS — Z794 Long term (current) use of insulin: Secondary | ICD-10-CM

## 2016-11-26 LAB — BASIC METABOLIC PANEL
BUN: 10 mg/dL (ref 6–23)
CO2: 27 mEq/L (ref 19–32)
CREATININE: 1.17 mg/dL (ref 0.40–1.50)
Calcium: 9.8 mg/dL (ref 8.4–10.5)
Chloride: 101 mEq/L (ref 96–112)
GFR: 90.26 mL/min (ref >60.00)
Glucose, Bld: 258 mg/dL — ABNORMAL HIGH (ref 70–99)
Potassium: 4 mEq/L (ref 3.5–5.1)
Sodium: 136 mEq/L (ref 135–145)

## 2016-11-26 MED ORDER — BAYER CONTOUR NEXT MONITOR W/DEVICE KIT: PACK | 1 refills | Status: DC

## 2016-11-26 NOTE — Telephone Encounter (Signed)
Ordered

## 2016-11-26 NOTE — Telephone Encounter (Signed)
[facility] contour next meter [facility]

## 2016-11-27 LAB — FRUCTOSAMINE: FRUCTOSAMINE: 346 umol/L — AB (ref 0–285)

## 2016-11-30 NOTE — Progress Notes (Deleted)
Patient ID: Roy Jennings, male   DOB: [birth year], 36 y.o.   MRN: [id number]            Reason for Appointment: Consultation for Type 2 Diabetes   History of Present Illness:          Date of diagnosis of type 2 diabetes mellitus: 12/2011       Background history:   He was initially diagnosed with marked hyperglycemia and A1c of 13.4% He was apparently started on insulin at onset and has taken various insulin regimens in the past He has had difficulty tolerating metformin and has not taken this most of the time He has probably been on Lantus since about 08/2015, previously on the 70/30 insulin His level of control had improved initially in 2014 and 2015  Recent history:   INSULIN regimen is: 55-60 Lantus     Non-insulin hypoglycemic drugs the patient is taking are: Metformin ER 500 mg  Current management, blood sugar patterns and problems identified:  Overall his blood sugars have been more difficult to control since late 2016 when his A1c went up to 11.2  Apparently prior to that he had been followed with diabetes education and was overall doing well  However he has had irregular follow-up   and blood sugars appear to be consistently high over the last year  Because of his blood sugars being much higher in 12/17 and lab glucose 496 his insulin dose was increased and he was started on metformin, Trulicity and Jardiance 10 mg  Even with increasing his insulin his blood sugars have been still mostly high although he thinks they are better at suppertime  He does not monitor his readings after meals   Although he has been to the dietitian in 1/17 he does not follow his diet and his frequently eating out at fast food restaurants; he thinks he tends to get more hungry in the afternoon  Recently has been trying to walk 1-1-1/2 hours at least 4 days a week in the mornings        Side effects from medications have been: Diarrhea from higher doses of metformin  Compliance with the  medical regimen: Poor  Hypoglycemia:   none  Glucose monitoring:  done 0-1 times a day         Glucometer: Contour   Blood Glucose readings by recall:   PREMEAL Breakfast Lunch Dinner Bedtime  Overall   Glucose range: 180-200  150-160    Median:        Self-care: The diet that the patient has been following OZ:DGUY    Typical meal intake: Breakfast is fruit               Dietician visit, most recent: 08/2015               Exercise: 10k   Weight history:   Wt Readings from Last 3 Encounters:  11/19/16 289 lb 3.2 oz (131.2 kg)  10/07/16 285 lb 6.4 oz (129.5 kg)  08/31/16 293 lb (132.9 kg)    Glycemic control:   Lab Results  Component Value Date   HGBA1C 8.2 10/07/2016   HGBA1C >14.0 07/16/2016   HGBA1C 10.6 02/07/2016   Lab Results  Component Value Date   MICROALBUR 0.3 07/16/2016   LDLCALC 124 (H) 08/28/2016   CREATININE 1.17 11/26/2016   Lab Results  Component Value Date   MICRALBCREAT 6 07/16/2016       Allergies as of 12/01/2016  Reactions   Metformin And Related    Diarrhea, nausea      Medication List       Accurate as of 11/30/16  9:36 PM. Always use your most recent med list.          aspirin EC 81 MG tablet Take 81 mg by mouth daily. Reported on 10/01/2015   atorvastatin 20 MG tablet Commonly known as:  LIPITOR Take 1 tablet (20 mg total) by mouth daily.   BAYER CONTOUR NEXT MONITOR w/Device Kit Use to test blood sugar   Dulaglutide 0.75 MG/0.5ML Sopn Commonly known as:  TRULICITY Inject 1 device weekly.   Dulaglutide 1.5 MG/0.5ML Sopn Commonly known as:  TRULICITY Inject one pen  (1.5m total) weekly   empagliflozin 10 MG Tabs tablet Commonly known as:  JARDIANCE Take 10 mg by mouth daily.   glucose blood test strip Commonly known as:  BAYER CONTOUR NEXT TEST Test twice a day   Insulin Glargine 100 UNIT/ML Solostar Pen Commonly known as:  LANTUS SOLOSTAR INJECT 65 UNITS INTO THE SKIN DAILY   metFORMIN 500 MG 24 hr  tablet Commonly known as:  GLUCOPHAGE XR Take 1 tablet (500 mg total) by mouth daily with breakfast.   MICROLET LANCETS Misc Test twice a day       Allergies:  Allergies  Allergen Reactions  . Metformin And Related     Diarrhea, nausea    Past Medical History:  Diagnosis Date  . Diabetes mellitus 12/30/11  . DKA (diabetic ketoacidoses) ([facility] 12/30/11   new dx DM a1c 13  . PVC (premature ventricular contraction)    LBB inferior axis QRS // [facility]  . Right bundle branch block     Past Surgical History:  Procedure Laterality Date  . KNEE ARTHROSCOPY  1999-2000   bilateral    Family History  Problem Relation Age of Onset  . Diabetes Father   . Diabetes Maternal Grandmother   . Hypertension Mother     Social History:  reports that he has never smoked. He has never used smokeless tobacco. He reports that he drinks alcohol. He reports that he does not use drugs.   Review of Systems   Lipid history: His lipids are higher than before.  Has not been told to take any medications after his labs last week     Lab Results  Component Value Date   CHOL 204 (H) 08/28/2016   HDL 36 (L) 08/28/2016   LDLCALC 124 (H) 08/28/2016   TRIG 222 (H) 08/28/2016   CHOLHDL 5.7 (H) 08/28/2016           Hypertension:Not present  Most recent eye exam was 1/18  Most recent foot exam: 1/18    LABS:  Lab on 11/26/2016  Component Date Value Ref Range Status  . Sodium 11/26/2016 136  135 - 145 mEq/L Final  . Potassium 11/26/2016 4.0  3.5 - 5.1 mEq/L Final  . Chloride 11/26/2016 101  96 - 112 mEq/L Final  . CO2 11/26/2016 27  19 - 32 mEq/L Final  . Glucose, Bld 11/26/2016 258* 70 - 99 mg/dL Final  . BUN 11/26/2016 10  6 - 23 mg/dL Final  . Creatinine, Ser 11/26/2016 1.17  0.40 - 1.50 mg/dL Final  . Calcium 11/26/2016 9.8  8.4 - 10.5 mg/dL Final  . GFR 11/26/2016 90.26  >60.00 mL/min Final  . Fructosamine 11/26/2016 346* 0 - 285 umol/L Final   Comment:  Published reference interval for apparently healthy  subjects between age 80 and 47 is 8 - 61 umol/L and in a poorly controlled diabetic population is 228 - 563 umol/L with a mean of 396 umol/L.     Physical Examination:  There were no vitals taken for this visit.  No lower leg edema present    ASSESSMENT:  Diabetes type 2, uncontrolled   Problems identified:  Inadequate compliance with diet, getting more fast food and high carbohydrate or high-fat meals and snacks  Tendency to increased hunger especially in the afternoons   Difficulty losing weight.  Some degree of insulin resistance with blood sugars still relatively high with 60 units of insulin  Probable post prandial hyperglycemia, currently not monitoring this  Inadequate doses of non-insulin drugs including metformin, Jardiance and Trulicity  Needs increased level of motivation to check blood sugar, followed diet and regular follow-up   Complications of diabetes: Mild symptoms of neuropathy  HYPERLIPIDEMIA: He should be on a statin drug, but defer to PCP  PLAN:    He needs to get back into the diabetes education program which is sponsored by his wife's work  He needs to avoid fast food restaurants and cut back on high-fat and high carbohydrate intake  Will increase his Trulicity up to 1.5 mg weekly, he can use up current supply with taking 2 injections at a time  Also advised him to rotate injection sites to the softer areas of his abdomen instead of near the rib cage.  He will increase Jardiance to 20 mg in the mornings and neck prescription will need to be 25 mg  Change Lantus to the evening and you 65 units for now  Start monitoring readings after meals to help identify postprandial patterns and also may help him better understand the effects of various foods  If he continues to have significant postprandial hyperglycemia and may need mealtime insulin  Follow-up in 4 weeks and will need repeat  BMP   There are no Patient Instructions on file for this visit. Counseling time on subjects discussed above is over 50% of today's 25 minute visit    Ziyan Hillmer 11/30/2016, 9:36 PM   Note: This office note was prepared with Estate agent. Any transcriptional errors that result from this process are unintentional.

## 2016-12-01 ENCOUNTER — Ambulatory Visit: Payer: 59 | Admitting: Endocrinology

## 2016-12-01 NOTE — Telephone Encounter (Signed)
Could you please assist with releasing the labs?  Thanks!

## 2016-12-01 NOTE — Telephone Encounter (Signed)
Pt also requests that his lab results be released to MyChart.

## 2016-12-01 NOTE — Telephone Encounter (Signed)
done

## 2016-12-10 ENCOUNTER — Telehealth: Payer: Self-pay | Admitting: Endocrinology

## 2016-12-10 MED ORDER — FREESTYLE LITE DEVI: 1 refills | Status: DC

## 2016-12-10 MED ORDER — GLUCOSE BLOOD VI STRP: ORAL_STRIP | 2 refills | Status: DC

## 2016-12-10 MED ORDER — FREESTYLE LANCETS MISC: 12 refills | Status: DC

## 2016-12-10 MED FILL — FREESTYLE FREEDOM LITE METE: W/DEVICE | 30 days supply | Qty: 1 | Fill #0

## 2016-12-10 MED FILL — FREESTYLE LITE TEST STRIP: 90 days supply | Qty: 200 | Fill #0

## 2016-12-10 MED FILL — FREESTYLE LANCETS: 90 days supply | Qty: 200 | Fill #0

## 2016-12-10 NOTE — Telephone Encounter (Signed)
I contacted the patient and advised we have confirmed the freestyle lite meter needed to be submitted. Rx sent for the meter, test strips and lancets. Patient advised to call back if he had any issues picking the medication up.

## 2016-12-10 NOTE — Telephone Encounter (Signed)
Pt called in and said that [facility] been trying to get in contact with us about getting the alternate glucose monitor filled for him, he thinks it is the Freestyle but could not remember, he asks if we could get in contact with the pharmacy and get the correct machine filled.

## 2016-12-21 ENCOUNTER — Other Ambulatory Visit: Payer: Self-pay

## 2016-12-21 ENCOUNTER — Ambulatory Visit (INDEPENDENT_AMBULATORY_CARE_PROVIDER_SITE_OTHER): Payer: 59 | Admitting: Endocrinology

## 2016-12-21 ENCOUNTER — Encounter: Payer: Self-pay | Admitting: Endocrinology

## 2016-12-21 VITALS — BP 132/88 | HR 113 | Ht 72.0 in | Wt 283.8 lb

## 2016-12-21 DIAGNOSIS — E1165 Type 2 diabetes mellitus with hyperglycemia: Secondary | ICD-10-CM

## 2016-12-21 DIAGNOSIS — Z794 Long term (current) use of insulin: Secondary | ICD-10-CM

## 2016-12-21 MED ORDER — INSULIN PEN NEEDLE 29G X 8MM MISC: 2 refills | Status: DC

## 2016-12-21 MED ORDER — DULAGLUTIDE 1.5 MG/0.5ML ~~LOC~~ SOAJ: SUBCUTANEOUS | 3 refills | Status: DC

## 2016-12-21 MED ORDER — EMPAGLIFLOZIN 25 MG PO TABS: 25.0000 mg | ORAL_TABLET | Freq: Every day | ORAL | 3 refills | Status: DC

## 2016-12-21 MED FILL — JARDIANCE 25 MG TABLET: 25 | 30 days supply | Qty: 30 | Fill #0

## 2016-12-21 MED FILL — TRULICITY 1.5 MG/0.5 ML PEN: 1.5 | 28 days supply | Qty: 2 | Fill #0

## 2016-12-21 MED FILL — UNIFINE PENTIPS 8MM 31G: 31G X 8 MM | 30 days supply | Qty: 100 | Fill #0

## 2016-12-21 NOTE — Patient Instructions (Addendum)
Check blood sugars on waking up  4/7 days  Also check blood sugars about 2 hours after a meal and do this after different meals by rotation  Recommended blood sugar levels on waking up is 90-130 and about 2 hours after meal is 130-160  Please bring your blood sugar monitor to each visit, thank you  Take 2 Jardiance in am

## 2016-12-21 NOTE — Progress Notes (Signed)
Patient ID: Roy Jennings, male   DOB: [birth year], 36 y.o.   MRN: [id number]            Reason for Appointment: Follow-up for Type 2 Diabetes   History of Present Illness:          Date of diagnosis of type 2 diabetes mellitus: 12/2011       Background history:   He was initially diagnosed with marked hyperglycemia and A1c of 13.4% He was apparently started on insulin at onset and has taken various insulin regimens in the past He has had difficulty tolerating metformin and has not taken this most of the time He has probably been on Lantus since about 08/2015, previously on the 70/30 insulin His level of control had improved initially in 2014 and 2015  Recent history:   INSULIN regimen is: 0-65  Lantus     Non-insulin hypoglycemic drugs the patient is taking are: Metformin ER 500 mg, Jardiance 10 mg daily  His last A1c was 8.2 and fructosamine was 349 He has not been seen in follow-up since his initial consultation in January  Current management, blood sugar patterns and problems identified:  He has not checked his blood sugars consistently and did not bring his monitor  He says his blood sugars are about the same as before when he was told to increase his Lantus and Jardiance  Although he has taken 65 units of Lantus he does not do this every day as he thinks that his pen needle is too short and he is finding the injection somewhat painful and they insulin is deposited in a bleb under the skin or the needle is bending  He does not take his Lantus every day for this reason  Not clear what his blood sugars are after supper or other meals  Recently has been trying to walk and go to the gym  He has done some diabetes education, may have improved his diet somewhat with cutting out fast food but still can be better  Has lost 10 pounds since his last visit        Side effects from medications have been: Diarrhea from higher doses of metformin  Compliance with the medical  regimen: Poor  Hypoglycemia:   none  Glucose monitoring:  done 0-1 times a day         Glucometer:  Freestyle   Blood Glucose readings by recall:  Mean values apply above for all meters except median for One Touch  PRE-MEAL Fasting Lunch Dinner Bedtime Overall  Glucose range: 170+ 200  ?   Mean/median:          Self-care: The diet that the patient has been following is: Somewhat lower fat    Typical meal intake: Breakfast is fruit               Dietician visit, most recent: 08/2015, diabetes educator 3/18               Exercise: walking 10k steps, Gym.  Weight history:   Wt Readings from Last 3 Encounters:  12/21/16 283 lb 12.8 oz (128.7 kg)  11/19/16 289 lb 3.2 oz (131.2 kg)  10/07/16 285 lb 6.4 oz (129.5 kg)    Glycemic control:   Lab Results  Component Value Date   HGBA1C 8.2 10/07/2016   HGBA1C >14.0 07/16/2016   HGBA1C 10.6 02/07/2016   Lab Results  Component Value Date   MICROALBUR 0.3 07/16/2016   LDLCALC 124 (H) 08/28/2016  CREATININE 1.17 11/26/2016   Lab Results  Component Value Date   MICRALBCREAT 6 07/16/2016       Allergies as of 12/21/2016      Reactions   Metformin And Related    Diarrhea, nausea      Medication List       Accurate as of 12/21/16  9:59 AM. Always use your most recent med list.          aspirin EC 81 MG tablet Take 81 mg by mouth daily. Reported on 10/01/2015   atorvastatin 20 MG tablet Commonly known as:  LIPITOR Take 1 tablet (20 mg total) by mouth daily.   BAYER CONTOUR NEXT MONITOR w/Device Kit Use to test blood sugar   FREESTYLE LITE Devi Test twice a day   Dulaglutide 1.5 MG/0.5ML Sopn Commonly known as:  TRULICITY Inject one pen  (1.1m total) weekly   empagliflozin 25 MG Tabs tablet Commonly known as:  JARDIANCE Take 25 mg by mouth daily.   freestyle lancets Test twice a day   glucose blood test strip Commonly known as:  FREESTYLE LITE Test twice a day   Insulin Glargine 100 UNIT/ML  Solostar Pen Commonly known as:  LANTUS SOLOSTAR INJECT 65 UNITS INTO THE SKIN DAILY   metFORMIN 500 MG 24 hr tablet Commonly known as:  GLUCOPHAGE XR Take 1 tablet (500 mg total) by mouth daily with breakfast.       Allergies:  Allergies  Allergen Reactions  . Metformin And Related     Diarrhea, nausea    Past Medical History:  Diagnosis Date  . Diabetes mellitus 12/30/11  . DKA (diabetic ketoacidoses) ([facility] 12/30/11   new dx DM a1c 13  . PVC (premature ventricular contraction)    LBB inferior axis QRS // followed by [facility]  . Right bundle branch block     Past Surgical History:  Procedure Laterality Date  . KNEE ARTHROSCOPY  1999-2000   bilateral    Family History  Problem Relation Age of Onset  . Diabetes Father   . Diabetes Maternal Grandmother   . Hypertension Mother     Social History:  reports that he has never smoked. He has never used smokeless tobacco. He reports that he drinks alcohol. He reports that he does not use drugs.   Review of Systems   Lipid history: His lipids are Being followed by PCP    Lab Results  Component Value Date   CHOL 204 (H) 08/28/2016   HDL 36 (L) 08/28/2016   LDLCALC 124 (H) 08/28/2016   TRIG 222 (H) 08/28/2016   CHOLHDL 5.7 (H) 08/28/2016           Hypertension:Not on any medications, followed by PCP  Most recent eye exam was 1/18  Most recent foot exam: 1/18    LABS:  No visits with results within 1 Week(s) from this visit.  Latest known visit with results is:  Lab on 11/26/2016  Component Date Value Ref Range Status  . Sodium 11/26/2016 136  135 - 145 mEq/L Final  . Potassium 11/26/2016 4.0  3.5 - 5.1 mEq/L Final  . Chloride 11/26/2016 101  96 - 112 mEq/L Final  . CO2 11/26/2016 27  19 - 32 mEq/L Final  . Glucose, Bld 11/26/2016 258* 70 - 99 mg/dL Final  . BUN 11/26/2016 10  6 - 23 mg/dL Final  . Creatinine, Ser 11/26/2016 1.17  0.40 - 1.50 mg/dL Final  . Calcium 11/26/2016 9.8  8.4 - 10.5  mg/dL Final  . GFR 11/26/2016 90.26  >60.00 mL/min Final  . Fructosamine 11/26/2016 346* 0 - 285 umol/L Final   Comment: Published reference interval for apparently healthy subjects between age 21 and 40 is 33 - 285 umol/L and in a poorly controlled diabetic population is 228 - 563 umol/L with a mean of 396 umol/L.     Physical Examination:  BP 132/88   Pulse (!) 113   Ht 6' (1.829 m)   Wt 283 lb 12.8 oz (128.7 kg)   SpO2 96%   BMI 38.49 kg/m      ASSESSMENT:  Diabetes type 2, uncontrolled See history of present illness for detailed discussion of current diabetes management, blood sugar patterns and problems identified  His last A1c was still high at 8.2 and fructosamine was 349 last month   Problems identified:  Inconsistent diet, this is despite getting some diabetes education  He claims that he is not getting his Lantus consistently because of problems with the pen needle which she has not reported before  Probably not checking readings after meals especially after supper  Not able to review his blood sugars today as he did not bring his monitor  Consistently high fasting readings  Insulin resistance  Complications of diabetes: Mild symptoms of neuropathy  HYPERLIPIDEMIA: He should be on a statin drug, will check lipids again on the next visit   PLAN:    He needs to be more consistent with his follow-up  Start checking blood sugars after meals more consistently especially after supper  He will work with the diabetes educator further and try to improve diet  Increase Jardiance up to 25 mg, co-pay card given.  He can use up his 10 mg tablets and take 2 together, he will call if he has excessive polyuria  Will get him a longer needle for his Lantus and he will try to do the Lantus more consistently at 65 units, however discussed that potentially may need to reduce this if morning readings are low normal  Discussed normal blood sugar targets  Discussed  benefits of Jardiance and need for long-term treatment  This may also help his blood pressure but will need to follow this  Continue to exercise regularly  He related to bring his monitor for download on each visit  He related to try and increase his metformin to 2 tablets a day   Patient Instructions  Check blood sugars on waking up  4/7 days  Also check blood sugars about 2 hours after a meal and do this after different meals by rotation  Recommended blood sugar levels on waking up is 90-130 and about 2 hours after meal is 130-160  Please bring your blood sugar monitor to each visit, thank you  Take 2 Jardiance in am       Counseling time on subjects discussed above is over 50% of today's 25 minute visit    Liset Mcmonigle 12/21/2016, 9:59 AM   Note: This office note was prepared with Dragon voice recognition system technology. Any transcriptional errors that result from this process are unintentional.

## 2017-01-05 MED FILL — METFORMIN HCL ER 500 MG TAB: 500 | 30 days supply | Qty: 30 | Fill #1

## 2017-01-07 ENCOUNTER — Other Ambulatory Visit: Payer: Self-pay

## 2017-01-07 VITALS — BP 138/82 | HR 100 | Resp 16 | Ht 71.0 in | Wt 286.6 lb

## 2017-01-07 DIAGNOSIS — E1165 Type 2 diabetes mellitus with hyperglycemia: Principal | ICD-10-CM

## 2017-01-07 DIAGNOSIS — Z794 Long term (current) use of insulin: Principal | ICD-10-CM

## 2017-01-07 DIAGNOSIS — IMO0001 Reserved for inherently not codable concepts without codable children: Secondary | ICD-10-CM

## 2017-01-07 NOTE — Patient Instructions (Signed)
1. Plan to eat 45-60 GM (3-4) servings of carbohydrate a meal and 15 GM for snacks.  Plan to eat protein with your snacks 2. Plan to check blood sugar once a day fasting or 1 -2hrs after a meal.  Goals of 80-130 fasting and 180 or less after eating. 3. Plan to pack your lunch 3 times a week 4. Plan to continue to not drink sweet tea 5. Plan to walk 5-6 times a week for 60 minutes.  Plan to continue weight training twice a week 6. Plan to complete EMMI programs by 03/03/17 7. Plan to keep appt with Dr. Lucianne MussKumar on 02/17/17 Plan to return to [facility] on 03/18/17 at 9 AM

## 2017-01-07 NOTE — Patient Outreach (Signed)
[facility]) Care Management   01/07/2017  Roy Jennings [birth year] [id number]  NESHAWN Jennings is an 36 y.o. male.   Member seen for follow up office visit for [facility] program for self management of Type 2 diabetes  Subjective: Member states that he has been feeling good.  States he has been trying to check his blood sugar daily but he did not bring his meter to his visit.  States he saw Dr.Kumar last month and he increased his Jardiance.  States his blood sugars in the morning have been in the 150-170 range.  States that he has not been checking after meals.  States he is trying to take his lunch more and eating fast food less.  States he is trying to use My Fitness Pal to log his meals.  States he is walking 4-5 times a week and doing weight training twice a week.  States he completed the computer programs and he would like some more to see.  Objective:   Review of Systems  All other systems reviewed and are negative.   Physical Exam Today's Vitals   01/07/17 0857 01/07/17 0904  BP: 138/82   Pulse: 100   Resp: 16   SpO2: 100%   Weight: 286 lb 9.6 oz (130 kg)   Height: 1.803 m ('5\' 11"'$ )   PainSc: 0-No pain 0-No pain   Encounter Medications:   Outpatient Encounter Prescriptions as of 01/07/2017  Medication Sig  . aspirin EC 81 MG tablet Take 81 mg by mouth daily. Reported on 10/01/2015  . atorvastatin (LIPITOR) 20 MG tablet Take 1 tablet (20 mg total) by mouth daily.  . Blood Glucose Monitoring Suppl (FREESTYLE LITE) DEVI Test twice a day  . Dulaglutide (TRULICITY) 1.5 AQ/7.6AU SOPN Inject one pen  (1.'5mg'$  total) weekly  . empagliflozin (JARDIANCE) 25 MG TABS tablet Take 25 mg by mouth daily.  Marland Kitchen glucose blood (FREESTYLE LITE) test strip Test twice a day  . Insulin Glargine (LANTUS SOLOSTAR) 100 UNIT/ML Solostar Pen INJECT 65 UNITS INTO THE SKIN DAILY  . Insulin Pen Needle 29G X 8MM MISC Use to inject insulin 1-3 times into skin daily  . Lancets (FREESTYLE)  lancets Test twice a day  . metFORMIN (GLUCOPHAGE XR) 500 MG 24 hr tablet Take 1 tablet (500 mg total) by mouth daily with breakfast.  . [DISCONTINUED] Blood Glucose Monitoring Suppl (BAYER CONTOUR NEXT MONITOR) w/Device KIT Use to test blood sugar (Patient not taking: Reported on 12/21/2016)   No facility-administered encounter medications on file as of 01/07/2017.     Functional Status:   In your present state of health, do you have any difficulty performing the following activities: 01/07/2017 11/19/2016  Hearing? N N  Vision? N N  Difficulty concentrating or making decisions? N N  Walking or climbing stairs? N N  Dressing or bathing? N N  Doing errands, shopping? N N  Some recent data might be hidden    Fall/Depression Screening:    Fall Risk  01/07/2017 11/19/2016 10/07/2016  Falls in the past year? No No No   PHQ 2/9 Scores 01/07/2017 11/19/2016 10/07/2016 01/20/2016 11/25/2015 10/05/2015 07/30/2015  PHQ - 2 Score 0 0 0 0 0 0 0    Assessment:  Member seen for follow up office visit for [facility] program for self management of Type 2 diabetes. Member is not meeting diabetes self management goal of hemoglobin A1C of 7% or below with last reading of 8.2%. Member now seeing  endocrinologist.Member reports taking medications as directed. Reports increase exercising 5-6 times a week. Member trying to watch CHO portions  reports eating less fast food for lunch and taking his lunch more. Reports checking daily ranging 150-170 but did not bring meter to visit.Member completed assigned EMMI programs per review of computer log and requests more to be assigned.  Assigned Diabetes eye care, foot care and Diabetes: Nutrition and Healthy Eating  Member is up to date with annual eye exam.   Plan:   Plan to eat 45-60 GM (3-4) servings of carbohydrate a meal and 15 GM for snacks.  Plan to eat protein with your snacks Plan to check blood sugar once a day fasting or 1 -2hrs after a meal.  Goals of 80-130  fasting and 180 or less after eating. Plan to pack your lunch 3 times a week Plan to continue to not drink sweet tea Plan to walk 5-6 times a week for 60 minutes.  Plan to continue weight training twice a week Plan to complete EMMI programs by 03/03/17 Plan to keep appt with Dr. Dwyane Dee on 02/17/17    Plan to return to [facility] on 03/18/17 at 9 AM  [facility] CM Care Plan Problem One     Most Recent Value  Care Plan Problem One  Elevated blood sugars as evidenced by hemoglobin A1C >14% related to dx of Type 2 DM  Role Documenting the Problem One  Care Management Coordinator  Care Plan for Problem One  Active  [facility] Long Term Goal   Member will decrease hemoglobin A1C by 1 point in the next 90 days  [facility] Long Term Goal Start Date  01/07/17 Maryjane Hurter hemoglobin A1C lowered to 8.2%]  Interventions for Problem One Long Term Goal  Reinforced on CHO counting and portion control, Discussed good choices to carry for lunches, Reinforced to avoid fast food and to try to take his lunch to work instead of eating fast food,  Instructed on how Trulicity and Jardiance work to lower his blood sugars and potential side effects, Reinforced to check his blood sugars 1-2 times a day and to try to get some readings after meals, Reviewed blood sugar goals, Instructed to bring his meter to next visit to to take to his MD visit, Instructed to keep scheduled follow up appt with Dr. Dwyane Dee,  Encourged to continue to exercise daily and to do weight training twice a week, Assigned EMMI education programs    Peter Garter RN, [facility] Care Management Coordinator-[facility] [phone]

## 2017-01-27 MED FILL — TRULICITY 1.5 MG/0.5 ML PEN: 1.5 | 28 days supply | Qty: 2 | Fill #1

## 2017-02-02 MED FILL — JARDIANCE 25 MG TABLET: 25 | 30 days supply | Qty: 30 | Fill #1

## 2017-02-12 ENCOUNTER — Other Ambulatory Visit: Payer: 59

## 2017-02-17 ENCOUNTER — Ambulatory Visit: Payer: 59 | Admitting: Endocrinology

## 2017-02-17 DIAGNOSIS — Z0289 Encounter for other administrative examinations: Secondary | ICD-10-CM

## 2017-02-23 MED FILL — LANTUS SOLOSTAR 100 UNITS/M: 100 | 23 days supply | Qty: 15 | Fill #2

## 2017-02-23 MED FILL — TRULICITY 1.5 MG/0.5 ML PEN: 1.5 | 28 days supply | Qty: 2 | Fill #2

## 2017-03-18 ENCOUNTER — Other Ambulatory Visit: Payer: Self-pay

## 2017-03-19 NOTE — Patient Outreach (Signed)
[facility]) Care Management  03/19/2017  JORREL CRIPE 09-18-1980 [id number]   Member did not show up for 03/18/17 appt.  Missed appt letter to be sent.   Dudley Major RN, [facility] Care Management Coordinator-[facility] Care Management [phone]

## 2017-03-30 MED FILL — TRULICITY 1.5 MG/0.5 ML PEN: 1.5 | 28 days supply | Qty: 2 | Fill #3

## 2017-04-01 MED FILL — FREESTYLE FREEDOM LITE METE: W/DEVICE | 30 days supply | Qty: 1 | Fill #1

## 2017-04-06 ENCOUNTER — Other Ambulatory Visit: Payer: Self-pay | Admitting: Endocrinology

## 2017-04-06 MED FILL — LANTUS SOLOSTAR 100 UNITS/M: 100 | 23 days supply | Qty: 15 | Fill #0

## 2017-04-09 ENCOUNTER — Other Ambulatory Visit: Payer: Self-pay

## 2017-04-09 NOTE — Patient Outreach (Signed)
[facility]) Care Management  04/09/2017  Brien FewJames R Apgar 26-Feb-1981 [id number]   Member has not responded to missed appointment letter sent on 03/18/17.   Member will be terminated from the [facility] [employer] program as he has not meet program requirements. Plan to close case as member has withdrawn from the program.   Plan to send case closure letter to member and his provider. Dudley MajorMelissa Jefrey Raburn RN, [facility] Care Management Coordinator-[facility] Care Management [phone]

## 2017-04-28 ENCOUNTER — Ambulatory Visit (INDEPENDENT_AMBULATORY_CARE_PROVIDER_SITE_OTHER): Payer: 59 | Admitting: Family Medicine

## 2017-04-28 ENCOUNTER — Encounter: Payer: Self-pay | Admitting: Family Medicine

## 2017-04-28 VITALS — BP 130/80 | HR 88 | Wt 292.0 lb

## 2017-04-28 DIAGNOSIS — E7800 Pure hypercholesterolemia, unspecified: Secondary | ICD-10-CM | POA: Diagnosis not present

## 2017-04-28 DIAGNOSIS — Z9119 Patient's noncompliance with other medical treatment and regimen: Secondary | ICD-10-CM

## 2017-04-28 DIAGNOSIS — E1165 Type 2 diabetes mellitus with hyperglycemia: Secondary | ICD-10-CM | POA: Diagnosis not present

## 2017-04-28 DIAGNOSIS — Z794 Long term (current) use of insulin: Secondary | ICD-10-CM | POA: Diagnosis not present

## 2017-04-28 DIAGNOSIS — IMO0001 Reserved for inherently not codable concepts without codable children: Secondary | ICD-10-CM

## 2017-04-28 DIAGNOSIS — Z91199 Patient's noncompliance with other medical treatment and regimen due to unspecified reason: Secondary | ICD-10-CM

## 2017-04-28 LAB — CBC WITH DIFFERENTIAL/PLATELET
Basophils Absolute: 29 cells/uL (ref 0–200)
Basophils Relative: 0.5 %
Eosinophils Absolute: 103 cells/uL (ref 15–500)
Eosinophils Relative: 1.8 %
HEMATOCRIT: 43 % (ref 38.5–50.0)
Hemoglobin: 14 g/dL (ref 13.2–17.1)
LYMPHS ABS: 1761 {cells}/uL (ref 850–3900)
MCH: 25.9 pg — ABNORMAL LOW (ref 27.0–33.0)
MCHC: 32.6 g/dL (ref 32.0–36.0)
MCV: 79.6 fL — AB (ref 80.0–100.0)
MPV: 9.5 fL (ref 7.5–12.5)
Monocytes Relative: 8.4 %
NEUTROS PCT: 58.4 %
Neutro Abs: 3329 cells/uL (ref 1500–7800)
Platelets: 390 10*3/uL (ref 140–400)
RBC: 5.4 10*6/uL (ref 4.20–5.80)
RDW: 13.8 % (ref 11.0–15.0)
Total Lymphocyte: 30.9 %
WBC: 5.7 10*3/uL (ref 3.8–10.8)
WBCMIX: 479 {cells}/uL (ref 200–950)

## 2017-04-28 LAB — COMPLETE METABOLIC PANEL WITH GFR
AG RATIO: 1.4 (calc) (ref 1.0–2.5)
ALT: 20 U/L (ref 9–46)
AST: 15 U/L (ref 10–40)
Albumin: 4.4 g/dL (ref 3.6–5.1)
Alkaline phosphatase (APISO): 92 U/L (ref 40–115)
BUN: 10 mg/dL (ref 7–25)
CALCIUM: 9.8 mg/dL (ref 8.6–10.3)
CO2: 27 mmol/L (ref 20–32)
Chloride: 100 mmol/L (ref 98–110)
Creat: 1.12 mg/dL (ref 0.60–1.35)
GFR, EST AFRICAN AMERICAN: 97 mL/min/{1.73_m2} (ref >=60)
GFR, EST NON AFRICAN AMERICAN: 83 mL/min/{1.73_m2} (ref >=60)
GLOBULIN: 3.2 g/dL (ref 1.9–3.7)
Glucose, Bld: 243 mg/dL — ABNORMAL HIGH (ref 65–99)
POTASSIUM: 4.3 mmol/L (ref 3.5–5.3)
SODIUM: 136 mmol/L (ref 135–146)
TOTAL PROTEIN: 7.6 g/dL (ref 6.1–8.1)
Total Bilirubin: 0.4 mg/dL (ref 0.2–1.2)

## 2017-04-28 LAB — TSH: TSH: 0.62 mIU/L (ref 0.40–4.50)

## 2017-04-28 LAB — LIPID PANEL
CHOL/HDL RATIO: 5.1 (calc) — AB (ref <5.0)
Cholesterol: 149 mg/dL (ref <200)
HDL: 29 mg/dL — ABNORMAL LOW (ref >40)
LDL Cholesterol (Calc): 83 mg/dL (calc)
NON-HDL CHOLESTEROL (CALC): 120 mg/dL (ref <130)
Triglycerides: 308 mg/dL — ABNORMAL HIGH (ref <150)

## 2017-04-28 MED ORDER — DULAGLUTIDE 1.5 MG/0.5ML ~~LOC~~ SOAJ
SUBCUTANEOUS | 0 refills | Status: DC
Start: 2017-04-28 — End: 2017-04-29

## 2017-04-28 NOTE — Progress Notes (Signed)
   Subjective:    Patient ID: Roy Jennings, male    DOB: [birth year], 36 y.o.   MRN: [id number]  HPI Chief Complaint  Patient presents with  . med check    med check    He is here for a med check. He is seeing Dr. Lucianne Muss for his diabetes. His blood sugars have been >200 fasting for the past 2 weeks since being out of Trulicity. States he has not followed up with his endocrinologist but plans to schedule with him soon.  Denies increased thirst, urination.   States he is not atorvastatin. States he does not like to take medication.  He is fasting today. Would like to recheck his cholesterol.   Diet has been unhealthy.  He has not been exercising.  He has not lost any weight.   Denies fever, chills, dizziness, chest pain, palpitations, shortness of breath, abdominal pain, N/V/D.   Reviewed allergies, medications, past medical, surgical,  and social history.   Review of Systems Pertinent positives and negatives in the history of present illness.     Objective:   Physical Exam BP 130/80   Pulse 88   Wt 292 lb (132.5 kg)   BMI 40.73 kg/m   Alert and in no distress. Cardiac exam shows a regular sinus rhythm without murmurs or gallops. Lungs are clear to auscultation.      Assessment & Plan:  Uncontrolled type 2 diabetes mellitus without complication, with long-term current use of insulin (HCC) - Plan: CBC with Differential/Platelet, COMPLETE METABOLIC PANEL WITH GFR, TSH, Lipid panel  Personal history of noncompliance with medical treatment, presenting hazards to health  Morbid obesity (HCC) - Plan: TSH, Lipid panel  Elevated LDL cholesterol level - Plan: Lipid panel  Discussed health risks from uncontrolled diabetes and morbid obesity.  Strongly encouraged him to improve lifestyle by making healthy food choices and start exercising. Recommend he avoid white foods.  2 weeks of Trulicity samples given until he can get back in to see his endocrinologist for his diabetes.    Advised him to start taking atorvastatin in order to prevent heart disease and stroke. Follow up as needed.    Refused flu shot.

## 2017-04-28 NOTE — Patient Instructions (Addendum)
Start taking atorvastatin.   Follow up with Dr. Lucianne Muss for your diabetes as discussed.   Pack healthy foods for your lunch and make healthy food decisions. Start being more physically active. You should be getting 150 minutes of exercise per week.     Carbohydrate Counting for Diabetes Mellitus, Adult Carbohydrate counting is a method for keeping track of how many carbohydrates you eat. Eating carbohydrates naturally increases the amount of sugar (glucose) in the blood. Counting how many carbohydrates you eat helps keep your blood glucose within normal limits, which helps you manage your diabetes (diabetes mellitus). It is important to know how many carbohydrates you can safely have in each meal. This is different for every person. A diet and nutrition specialist (registered dietitian) can help you make a meal plan and calculate how many carbohydrates you should have at each meal and snack. Carbohydrates are found in the following foods:  Grains, such as breads and cereals.  Dried beans and soy products.  Starchy vegetables, such as potatoes, peas, and corn.  Fruit and fruit juices.  Milk and yogurt.  Sweets and snack foods, such as cake, cookies, candy, chips, and soft drinks.  How do I count carbohydrates? There are two ways to count carbohydrates in food. You can use either of the methods or a combination of both. Reading "Nutrition Facts" on packaged food The "Nutrition Facts" list is included on the labels of almost all packaged foods and beverages in the [place] It includes:  The serving size.  Information about nutrients in each serving, including the grams (g) of carbohydrate per serving.  To use the "Nutrition Facts":  Decide how many servings you will have.  Multiply the number of servings by the number of carbohydrates per serving.  The resulting number is the total amount of carbohydrates that you will be having.  Learning standard serving sizes of other foods When  you eat foods containing carbohydrates that are not packaged or do not include "Nutrition Facts" on the label, you need to measure the servings in order to count the amount of carbohydrates:  Measure the foods that you will eat with a food scale or measuring cup, if needed.  Decide how many standard-size servings you will eat.  Multiply the number of servings by 15. Most carbohydrate-rich foods have about 15 g of carbohydrates per serving. ? For example, if you eat 8 oz (170 g) of strawberries, you will have eaten 2 servings and 30 g of carbohydrates (2 servings x 15 g = 30 g).  For foods that have more than one food mixed, such as soups and casseroles, you must count the carbohydrates in each food that is included.  The following list contains standard serving sizes of common carbohydrate-rich foods. Each of these servings has about 15 g of carbohydrates:   hamburger bun or  English muffin.   oz (15 mL) syrup.   oz (14 g) jelly.  1 slice of bread.  1 six-inch tortilla.  3 oz (85 g) cooked rice or pasta.  4 oz (113 g) cooked dried beans.  4 oz (113 g) starchy vegetable, such as peas, corn, or potatoes.  4 oz (113 g) hot cereal.  4 oz (113 g) mashed potatoes or  of a large baked potato.  4 oz (113 g) canned or frozen fruit.  4 oz (120 mL) fruit juice.  4-6 crackers.  6 chicken nuggets.  6 oz (170 g) unsweetened dry cereal.  6 oz (170 g) plain fat-free  yogurt or yogurt sweetened with artificial sweeteners.  8 oz (240 mL) milk.  8 oz (170 g) fresh fruit or one small piece of fruit.  24 oz (680 g) popped popcorn.  Example of carbohydrate counting Sample meal  3 oz (85 g) chicken breast.  6 oz (170 g) brown rice.  4 oz (113 g) corn.  8 oz (240 mL) milk.  8 oz (170 g) strawberries with sugar-free whipped topping. Carbohydrate calculation 1. Identify the foods that contain carbohydrates: ? Rice. ? Corn. ? Milk. ? Strawberries. 2. Calculate how many  servings you have of each food: ? 2 servings rice. ? 1 serving corn. ? 1 serving milk. ? 1 serving strawberries. 3. Multiply each number of servings by 15 g: ? 2 servings rice x 15 g = 30 g. ? 1 serving corn x 15 g = 15 g. ? 1 serving milk x 15 g = 15 g. ? 1 serving strawberries x 15 g = 15 g. 4. Add together all of the amounts to find the total grams of carbohydrates eaten: ? 30 g + 15 g + 15 g + 15 g = 75 g of carbohydrates total. This information is not intended to replace advice given to you by your health care provider. Make sure you discuss any questions you have with your health care provider. Document Released: 07/20/2005 Document Revised: 02/07/2016 Document Reviewed: 01/01/2016 Elsevier Interactive Patient Education  [employer].

## 2017-04-29 ENCOUNTER — Ambulatory Visit (INDEPENDENT_AMBULATORY_CARE_PROVIDER_SITE_OTHER): Payer: 59 | Admitting: Endocrinology

## 2017-04-29 ENCOUNTER — Encounter: Payer: Self-pay | Admitting: Endocrinology

## 2017-04-29 ENCOUNTER — Other Ambulatory Visit: Payer: Self-pay

## 2017-04-29 VITALS — BP 142/92 | HR 115 | Ht 72.0 in | Wt 296.6 lb

## 2017-04-29 DIAGNOSIS — E1165 Type 2 diabetes mellitus with hyperglycemia: Secondary | ICD-10-CM

## 2017-04-29 DIAGNOSIS — Z794 Long term (current) use of insulin: Secondary | ICD-10-CM | POA: Diagnosis not present

## 2017-04-29 DIAGNOSIS — R030 Elevated blood-pressure reading, without diagnosis of hypertension: Secondary | ICD-10-CM | POA: Diagnosis not present

## 2017-04-29 LAB — POCT GLYCOSYLATED HEMOGLOBIN (HGB A1C): HEMOGLOBIN A1C: 8.7

## 2017-04-29 MED ORDER — EMPAGLIFLOZIN 25 MG PO TABS: 25.0000 mg | ORAL_TABLET | Freq: Every day | ORAL | 3 refills | Status: DC

## 2017-04-29 MED ORDER — DULAGLUTIDE 1.5 MG/0.5ML ~~LOC~~ SOAJ: SUBCUTANEOUS | 1 refills | Status: DC

## 2017-04-29 NOTE — Patient Instructions (Signed)
Check blood sugars on waking up 4/7   Also check blood sugars about 2 hours after a meal and do this after different meals by rotation  Recommended blood sugar levels on waking up is 90-130 and about 2 hours after meal is 130-160  Please bring your blood sugar monitor to each visit, thank you  Walk daily  Watch diet for fats  Take Jardiance in am

## 2017-04-29 NOTE — Progress Notes (Signed)
Patient ID: Roy Jennings, male   DOB: 1980-12-11, 36 y.o.   MRN: [id number]            Reason for Appointment: Follow-up for Type 2 Diabetes   History of Present Illness:          Date of diagnosis of type 2 diabetes mellitus: 12/2011       Background history:   He was initially diagnosed with marked hyperglycemia and A1c of 13.4% He was apparently started on insulin at onset and has taken various insulin regimens in the past He has had difficulty tolerating metformin and has not taken this most of the time He has probably been on Lantus since about 08/2015, previously on the 70/30 insulin His level of control had improved initially in 2014 and 2015  Recent history:   INSULIN regimen is: 65  Lantus     Non-insulin hypoglycemic drugs the patient is taking are: Metformin ER 500 mg, Jardiance 25 mg daily, Trulicity 1.5 mg weekly  His A1c is now 8.7 He has not been seen in follow-up for several months  Current management, blood sugar patterns and problems identified:  He has not checked his blood sugars consistently and mostly in the morning fasting  These are mostly over 200 with a few readings slightly lower  Has only a couple of nonfasting readings which are significantly high  Fasting lab glucose was also over 200  He says he has not been taking his Jardiance and not clear why, does not apparently have a prescription  He has missed a few doses of Trulicity  He thinks he can do better with diet with cutting back on french fries and other types of foods  Has gained 10 pounds since his last visit        Side effects from medications have been: Diarrhea from higher doses of metformin  Compliance with the medical regimen: Poor  Hypoglycemia:   none  Glucose monitoring:  done 0-1 times a day         Glucometer:  Freestyle   Blood Glucose readings by monitor download:  Mean values apply above for all meters except median for One Touch  PRE-MEAL Fasting Lunch Dinner  Bedtime Overall  Glucose range: 159-299  284  261    Mean/median:     220     Self-care: The diet that the patient has been following is: Somewhat lower fat    Typical meal intake: Breakfast is eggs              Dietician visit, most recent: 08/2015, diabetes educator 3/18               Exercise: walking A little in the evenings  Weight history:   Wt Readings from Last 3 Encounters:  04/29/17 296 lb 9.6 oz (134.5 kg)  04/28/17 292 lb (132.5 kg)  01/07/17 286 lb 9.6 oz (130 kg)    Glycemic control:   Lab Results  Component Value Date   HGBA1C 8.2 10/07/2016   HGBA1C >14.0 07/16/2016   HGBA1C 10.6 02/07/2016   Lab Results  Component Value Date   MICROALBUR 0.3 07/16/2016   LDLCALC 124 (H) 08/28/2016   CREATININE 1.12 04/28/2017   Lab Results  Component Value Date   MICRALBCREAT 6 07/16/2016       Allergies as of 04/29/2017      Reactions   Metformin And Related    Diarrhea, nausea      Medication List  Accurate as of 04/29/17  5:03 PM. Always use your most recent med list.          aspirin EC 81 MG tablet Take 81 mg by mouth daily. Reported on 10/01/2015   atorvastatin 20 MG tablet Commonly known as:  LIPITOR Take 1 tablet (20 mg total) by mouth daily.   Dulaglutide 1.5 MG/0.5ML Sopn Commonly known as:  TRULICITY Inject one pen  (1.5mg  total) weekly   empagliflozin 25 MG Tabs tablet Commonly known as:  JARDIANCE Take 25 mg by mouth daily.   freestyle lancets Test twice a day   FREESTYLE LITE Devi Test twice a day   glucose blood test strip Commonly known as:  FREESTYLE LITE Test twice a day   Insulin Pen Needle 29G X Misc Use to inject insulin 1-3 times into skin daily   LANTUS SOLOSTAR 100 UNIT/ML Solostar Pen Generic drug:  Insulin Glargine INJECT 65 UNITS INTO THE SKIN DAILY       Allergies:  Allergies  Allergen Reactions  . Metformin And Related     Diarrhea, nausea    Past Medical History:  Diagnosis Date  .  Diabetes mellitus 12/30/11  . DKA (diabetic ketoacidoses) (HCC) 12/30/11   new dx DM a1c 13  . PVC (premature ventricular contraction)    LBB inferior axis QRS // followed by [facility]ology  . Right bundle branch block     Past Surgical History:  Procedure Laterality Date  . KNEE ARTHROSCOPY  1999-2000   bilateral    Family History  Problem Relation Age of Onset  . Diabetes Father   . Diabetes Maternal Grandmother   . Hypertension Mother     Social History:  reports that he has never smoked. He has never used smokeless tobacco. He reports that he drinks alcohol. He reports that he does not use drugs.   Review of Systems   Lipid history: His lipids are Being followed by PCP Has high triglycerides, previously has had a prescription for Lipitor    Lab Results  Component Value Date   CHOL 149 04/28/2017   HDL 29 (L) 04/28/2017   LDLCALC 124 (H) 08/28/2016   TRIG 308 (H) 04/28/2017   CHOLHDL 5.1 (H) 04/28/2017           Hypertension: Not on any medications, followed by PCP May be anxious today  BP Readings from Last 3 Encounters:  04/29/17 (!) 142/92  04/28/17 130/80  01/07/17 138/82    Most recent eye exam was 1/18  Most recent foot exam: 1/18    LABS:  Clinical Support on 04/28/2017  Component Date Value Ref Range Status  . WBC 04/28/2017 5.7  3.8 - 10.8 Thousand/uL Final  . RBC 04/28/2017 5.40  4.20 - 5.80 Million/uL Final  . Hemoglobin 04/28/2017 14.0  13.2 - 17.1 g/dL Final  . HCT 16/05/9603 43.0  38.5 - 50.0 % Final  . MCV 04/28/2017 79.6* 80.0 - 100.0 fL Final  . MCH 04/28/2017 25.9* 27.0 - 33.0 pg Final  . MCHC 04/28/2017 32.6  32.0 - 36.0 g/dL Final  . RDW 54/04/8118 13.8  11.0 - 15.0 % Final  . Platelets 04/28/2017 390  140 - 400 Thousand/uL Final  . MPV 04/28/2017 9.5  7.5 - 12.5 fL Final  . Neutro Abs 04/28/2017 3329  1,500 - 7,800 cells/uL Final  . Lymphs Abs 04/28/2017 1761  850 - 3,900 cells/uL Final  . WBC mixed population 04/28/2017  479  200 - 950 cells/uL Final  .  Eosinophils Absolute 04/28/2017 103  15 - 500 cells/uL Final  . Basophils Absolute 04/28/2017 29  0 - 200 cells/uL Final  . Neutrophils Relative % 04/28/2017 58.4  % Final  . Total Lymphocyte 04/28/2017 30.9  % Final  . Monocytes Relative 04/28/2017 8.4  % Final  . Eosinophils Relative 04/28/2017 1.8  % Final  . Basophils Relative 04/28/2017 0.5  % Final  . Glucose, Bld 04/28/2017 243* 65 - 99 mg/dL Final   Comment: .            Fasting reference interval . For someone without known diabetes, a glucose value >125 mg/dL indicates that they may have diabetes and this should be confirmed with a follow-up test. .   . BUN 04/28/2017 10  7 - 25 mg/dL Final  . Creat 16/05/9603 1.12  0.60 - 1.35 mg/dL Final  . GFR, Est Non African American 04/28/2017 83  > OR = 60 mL/min/1.41m2 Final  . GFR, Est African American 04/28/2017 97  > OR = 60 mL/min/1.77m2 Final  . BUN/Creatinine Ratio 04/28/2017 NOT APPLICABLE  6 - 22 (calc) Final  . Sodium 04/28/2017 136  135 - 146 mmol/L Final  . Potassium 04/28/2017 4.3  3.5 - 5.3 mmol/L Final  . Chloride 04/28/2017 100  98 - 110 mmol/L Final  . CO2 04/28/2017 27  20 - 32 mmol/L Final  . Calcium 04/28/2017 9.8  8.6 - 10.3 mg/dL Final  . Total Protein 04/28/2017 7.6  6.1 - 8.1 g/dL Final  . Albumin 54/04/8118 4.4  3.6 - 5.1 g/dL Final  . Globulin 14/78/2956 3.2  1.9 - 3.7 g/dL (calc) Final  . AG Ratio 04/28/2017 1.4  1.0 - 2.5 (calc) Final  . Total Bilirubin 04/28/2017 0.4  0.2 - 1.2 mg/dL Final  . Alkaline phosphatase (APISO) 04/28/2017 92  40 - 115 U/L Final  . AST 04/28/2017 15  10 - 40 U/L Final  . ALT 04/28/2017 20  9 - 46 U/L Final  . TSH 04/28/2017 0.62  0.40 - 4.50 mIU/L Final  . Cholesterol 04/28/2017 149  <200 mg/dL Final  . HDL 21/30/8657 29* >40 mg/dL Final  . Triglycerides 04/28/2017 308* <150 mg/dL Final  . LDL Cholesterol (Calc) 04/28/2017 83  mg/dL (calc) Final   Comment: Reference range:  <100 . Desirable range <100 mg/dL for primary prevention;   <70 mg/dL for patients with CHD or diabetic patients  with > or = 2 CHD risk factors. Marland Kitchen LDL-C is now calculated using the Martin-Hopkins  calculation, which is a validated novel method providing  better accuracy than the Friedewald equation in the  estimation of LDL-C.  Horald Pollen et al. Lenox Ahr. 8469;629(52): 2061-2068  (http://education.[employer].com/faq/FAQ164)   . Total CHOL/HDL Ratio 04/28/2017 5.1* <8.4 (calc) Final  . Non-HDL Cholesterol (Calc) 04/28/2017 120  <130 mg/dL (calc) Final   Comment: For patients with diabetes plus 1 major ASCVD risk  factor, treating to a non-HDL-C goal of <100 mg/dL  (LDL-C of <13 mg/dL) is considered a therapeutic  option.     Physical Examination:  BP (!) 142/92   Pulse (!) 115   Ht 6' (1.829 m)   Wt 296 lb 9.6 oz (134.5 kg)   SpO2 97%   BMI 40.23 kg/m   Repeat blood pressure unchanged with large cuff     ASSESSMENT:  Diabetes type 2, uncontrolled See history of present illness for detailed discussion of current diabetes management, blood sugar patterns and problems identified  His control is still consistently  poor with A1c 8.7   Problems identified:  Inconsistent medication regimen, not taking Jardiance  He can do better with his diet  Currently not doing any exercise regularly and not losing weight  Infrequent monitoring and no readings after meals  May be getting progression of his diabetes and possibly insulin deficiency with need for mealtime insulin also    HYPERLIPIDEMIA: Followed by PCP  Increase blood pressure: This may be related to anxiety but needs regular follow-up, also may improve with starting Jardiance  PLAN:    He needs to be more consistent with his follow-up  Start checking blood sugars after at least his evening meals and discussed blood sugar targets both fasting and after meals   Also he will need to make sure his test strips  are not expired  He will need to set the time on his monitor to the correct time  Again start Jardiance 25 mg daily in the morning daily  He will take Trulicity more regularly  Discussed that he may need to be on mealtime insulin also if his sugars are higher after evening meal  Unable to decide on  dosage of the Lantus until he is on a stable regimen of other medications  Also may consider switching to Toujeo if covered by insurance for better 24 control  More consistent diet and exercise, he can try to walk more in the evenings   Patient Instructions  Check blood sugars on waking up 4/7   Also check blood sugars about 2 hours after a meal and do this after different meals by rotation  Recommended blood sugar levels on waking up is 90-130 and about 2 hours after meal is 130-160  Please bring your blood sugar monitor to each visit, thank you  Walk daily  Watch diet for fats  Take Jardiance in am   Counseling time on subjects discussed in assessment and plan sections is over 50% of today's 25 minute visit  Mersedes Alber 04/29/2017, 5:03 PM   Note: This office note was prepared with Dragon voice recognition system technology. Any transcriptional errors that result from this process are unintentional.

## 2017-05-03 ENCOUNTER — Other Ambulatory Visit: Payer: Self-pay

## 2017-05-03 MED ORDER — EMPAGLIFLOZIN 25 MG PO TABS: 25.0000 mg | ORAL_TABLET | Freq: Every day | ORAL | 3 refills | Status: DC

## 2017-05-03 NOTE — Telephone Encounter (Signed)
Faxed jardiance to the [employer].

## 2017-05-10 MED FILL — TRULICITY 1.5 MG/0.5 ML PEN: 1.5 | 28 days supply | Qty: 2 | Fill #2

## 2017-05-11 MED FILL — LANTUS SOLOSTAR 100 UNITS/M: 100 | 23 days supply | Qty: 15 | Fill #1

## 2017-05-24 MED FILL — JARDIANCE 25 MG TABLET: 25 | 30 days supply | Qty: 30 | Fill #2

## 2017-06-04 MED FILL — TRULICITY 1.5 MG/0.5 ML PEN: 1.5 | 28 days supply | Qty: 2 | Fill #3

## 2017-06-07 ENCOUNTER — Other Ambulatory Visit (INDEPENDENT_AMBULATORY_CARE_PROVIDER_SITE_OTHER): Payer: 59

## 2017-06-07 DIAGNOSIS — Z794 Long term (current) use of insulin: Secondary | ICD-10-CM

## 2017-06-07 DIAGNOSIS — E1165 Type 2 diabetes mellitus with hyperglycemia: Secondary | ICD-10-CM | POA: Diagnosis not present

## 2017-06-07 LAB — COMPREHENSIVE METABOLIC PANEL
ALBUMIN: 4.3 g/dL (ref 3.5–5.2)
ALT: 25 U/L (ref 0–53)
AST: 22 U/L (ref 0–37)
Alkaline Phosphatase: 82 U/L (ref 39–117)
BUN: 14 mg/dL (ref 6–23)
CALCIUM: 10 mg/dL (ref 8.4–10.5)
CHLORIDE: 102 meq/L (ref 96–112)
CO2: 27 mEq/L (ref 19–32)
Creatinine, Ser: 1.22 mg/dL (ref 0.40–1.50)
GFR: 85.75 mL/min (ref >60.00)
Glucose, Bld: 139 mg/dL — ABNORMAL HIGH (ref 70–99)
POTASSIUM: 4 meq/L (ref 3.5–5.1)
Sodium: 137 mEq/L (ref 135–145)
Total Bilirubin: 0.4 mg/dL (ref 0.2–1.2)
Total Protein: 7.5 g/dL (ref 6.0–8.3)

## 2017-06-07 LAB — LIPID PANEL
CHOLESTEROL: 196 mg/dL (ref 0–200)
HDL: 31 mg/dL — ABNORMAL LOW (ref >39.00)
Total CHOL/HDL Ratio: 6

## 2017-06-07 LAB — LDL CHOLESTEROL, DIRECT: Direct LDL: 74 mg/dL

## 2017-06-07 LAB — HEMOGLOBIN A1C: HEMOGLOBIN A1C: 8.3 % — AB (ref 4.6–6.5)

## 2017-06-08 LAB — FRUCTOSAMINE: FRUCTOSAMINE: 291 umol/L — AB (ref 0–285)

## 2017-06-10 ENCOUNTER — Ambulatory Visit: Payer: 59 | Admitting: Endocrinology

## 2017-06-15 MED FILL — LANTUS SOLOSTAR 100 UNITS/M: 100 | 23 days supply | Qty: 15 | Fill #2

## 2017-06-17 ENCOUNTER — Ambulatory Visit: Payer: Self-pay

## 2017-06-29 ENCOUNTER — Other Ambulatory Visit: Payer: Self-pay | Admitting: Endocrinology

## 2017-06-30 MED FILL — TRULICITY 1.5 MG/0.5 ML PEN: 1.5 | 28 days supply | Qty: 2 | Fill #0

## 2017-07-12 ENCOUNTER — Ambulatory Visit: Payer: 59 | Admitting: Endocrinology

## 2017-08-02 ENCOUNTER — Other Ambulatory Visit: Payer: Self-pay | Admitting: Endocrinology

## 2017-08-02 MED FILL — LANTUS SOLOSTAR 100 UNITS/M: 100 | 23 days supply | Qty: 15 | Fill #0

## 2017-08-02 MED FILL — TRULICITY 1.5 MG/0.5 ML PEN: 1.5 | 28 days supply | Qty: 2 | Fill #0

## 2017-08-10 MED FILL — JARDIANCE 25 MG TABLET: 25 | 30 days supply | Qty: 30 | Fill #3

## 2017-08-17 DIAGNOSIS — Z0100 Encounter for examination of eyes and vision without abnormal findings: Secondary | ICD-10-CM | POA: Diagnosis not present

## 2017-08-17 LAB — HM DIABETES EYE EXAM

## 2017-09-01 ENCOUNTER — Encounter: Payer: Self-pay | Admitting: Family Medicine

## 2017-09-03 ENCOUNTER — Other Ambulatory Visit: Payer: Self-pay | Admitting: Endocrinology

## 2017-09-03 MED FILL — TRULICITY 1.5 MG/0.5 ML PEN: 1.5 | 28 days supply | Qty: 2 | Fill #0

## 2017-09-03 MED FILL — LANTUS SOLOSTAR 100 UNITS/M: 100 | 23 days supply | Qty: 15 | Fill #1

## 2017-09-09 ENCOUNTER — Other Ambulatory Visit (INDEPENDENT_AMBULATORY_CARE_PROVIDER_SITE_OTHER): Payer: 59

## 2017-09-09 DIAGNOSIS — E1165 Type 2 diabetes mellitus with hyperglycemia: Secondary | ICD-10-CM

## 2017-09-09 DIAGNOSIS — Z794 Long term (current) use of insulin: Secondary | ICD-10-CM | POA: Diagnosis not present

## 2017-09-09 LAB — BASIC METABOLIC PANEL
BUN: 15 mg/dL (ref 6–23)
CHLORIDE: 100 meq/L (ref 96–112)
CO2: 30 mEq/L (ref 19–32)
Calcium: 9.5 mg/dL (ref 8.4–10.5)
Creatinine, Ser: 1.14 mg/dL (ref 0.40–1.50)
GFR: 92.61 mL/min (ref >60.00)
Glucose, Bld: 160 mg/dL — ABNORMAL HIGH (ref 70–99)
POTASSIUM: 4 meq/L (ref 3.5–5.1)
SODIUM: 135 meq/L (ref 135–145)

## 2017-09-14 ENCOUNTER — Ambulatory Visit (INDEPENDENT_AMBULATORY_CARE_PROVIDER_SITE_OTHER): Payer: 59 | Admitting: Endocrinology

## 2017-09-14 ENCOUNTER — Encounter: Payer: Self-pay | Admitting: Endocrinology

## 2017-09-14 VITALS — BP 120/82 | HR 94 | Ht 72.0 in | Wt 292.0 lb

## 2017-09-14 DIAGNOSIS — E1165 Type 2 diabetes mellitus with hyperglycemia: Secondary | ICD-10-CM

## 2017-09-14 DIAGNOSIS — Z794 Long term (current) use of insulin: Secondary | ICD-10-CM | POA: Diagnosis not present

## 2017-09-14 LAB — POCT GLYCOSYLATED HEMOGLOBIN (HGB A1C): HEMOGLOBIN A1C: 8.6

## 2017-09-14 NOTE — Progress Notes (Signed)
Patient ID: Roy Jennings, male   DOB: 29-Apr-1981, 37 y.o.   MRN: [id number]            Reason for Appointment: Follow-up for Type 2 Diabetes   History of Present Illness:          Date of diagnosis of type 2 diabetes mellitus: 12/2011       Background history:   He was initially diagnosed with marked hyperglycemia and A1c of 13.4% He was apparently started on insulin at onset and has taken various insulin regimens in the past He has had difficulty tolerating metformin and has not taken this most of the time He has probably been on Lantus since about 08/2015, previously on the 70/30 insulin His level of control had improved initially in 2014 and 2015  Recent history:   INSULIN regimen is: 65  Lantus     Non-insulin hypoglycemic drugs the patient is taking are: Metformin ER 500 mg, Jardiance 25 mg daily, Trulicity 1.5 mg weekly  His A1c is now 8.6 and has been consistently over 8%  He has not been seen in follow-up for several months again  Current management, blood sugar patterns and problems identified:  He has not brought his monitor for download  Unlikely that he is checking his blood sugar regularly since he claims that his blood sugars are excellent but A1c is not any better  He thinks he may have had higher readings in December from inconsistent diet and medications but recently sugars are reportedly better  His lab glucose was 160 nonfasting after lunch  May have lost about 4 pounds  He does think that he is trying to [facility] However diet has been inconsistent  He will check some readings after dinner but not clear how often and does not think they are high        Side effects from medications have been: Diarrhea from higher doses of metformin  Compliance with the medical regimen: Poor  Hypoglycemia:   none  Glucose monitoring:  done 0-1 times a day         Glucometer:  Freestyle   Blood Glucose readings by recall   Mean values  apply above for all meters except median for One Touch  PRE-MEAL Fasting Lunch Dinner Bedtime Overall  Glucose range: 120      Mean/median:        POST-MEAL PC Breakfast PC Lunch PC Dinner  Glucose range:   160-180  Mean/median:      Previous readings:  Mean values apply above for all meters except median for One Touch  PRE-MEAL Fasting Lunch Dinner Bedtime Overall  Glucose range: 159-299  284  261    Mean/median:     220     Self-care: The diet that the patient has been following is: Somewhat lower fat    Typical meal intake: Breakfast is eggs               Dietician visit, most recent: 08/2015, diabetes educator 3/18               Exercise: walking bid at track  Weight history:   Wt Readings from Last 3 Encounters:  09/14/17 292 lb (132.5 kg)  04/29/17 296 lb 9.6 oz (134.5 kg)  04/28/17 292 lb (132.5 kg)    Glycemic control:   Lab Results  Component Value Date   HGBA1C 8.6 09/14/2017   HGBA1C 8.3 (H) 06/07/2017   HGBA1C 8.7  04/29/2017   Lab Results  Component Value Date   MICROALBUR 0.3 07/16/2016   LDLCALC 124 (H) 08/28/2016   CREATININE 1.14 09/09/2017   Lab Results  Component Value Date   MICRALBCREAT 6 07/16/2016       Allergies as of 09/14/2017      Reactions   Metformin And Related    Diarrhea, nausea      Medication List        Accurate as of 09/14/17  9:21 PM. Always use your most recent med list.          aspirin EC 81 MG tablet Take 81 mg by mouth daily. Reported on 10/01/2015   atorvastatin 20 MG tablet Commonly known as:  LIPITOR Take 1 tablet (20 mg total) by mouth daily.   empagliflozin 25 MG Tabs tablet Commonly known as:  JARDIANCE Take 25 mg by mouth daily.   freestyle lancets Test twice a day   FREESTYLE LITE Devi Test twice a day   glucose blood test strip Commonly known as:  FREESTYLE LITE Test twice a day   Insulin Pen Needle 29G X Misc Use to inject insulin 1-3 times into skin daily   LANTUS  SOLOSTAR 100 UNIT/ML Solostar Pen Generic drug:  Insulin Glargine INJECT 65 UNITS INTO THE SKIN DAILY   TRULICITY 1.5 MG/0.5ML Sopn Generic drug:  Dulaglutide INJECT ONE PEN INTO THE SKIN WEEKLY       Allergies:  Allergies  Allergen Reactions  . Metformin And Related     Diarrhea, nausea    Past Medical History:  Diagnosis Date  . Diabetes mellitus 12/30/11  . DKA (diabetic ketoacidoses) (HCC) 12/30/11   new dx DM a1c 13  . PVC (premature ventricular contraction)    LBB inferior axis QRS // followed by [facility]  . Right bundle branch block     Past Surgical History:  Procedure Laterality Date  . KNEE ARTHROSCOPY  1999-2000   bilateral    Family History  Problem Relation Age of Onset  . Diabetes Father   . Diabetes Maternal Grandmother   . Hypertension Mother     Social History:  reports that  has never smoked. he has never used smokeless tobacco. He reports that he drinks alcohol. He reports that he does not use drugs.   Review of Systems   Lipid history: His lipids are Being followed by PCP Has high triglycerides, previously has had a prescription for Lipitor    Lab Results  Component Value Date   CHOL 196 06/07/2017   HDL 31.00 (L) 06/07/2017   LDLCALC 124 (H) 08/28/2016   LDLDIRECT 74.0 06/07/2017   TRIG (H) 06/07/2017    821.0 Triglyceride is over 400; calculations on Lipids are invalid.   CHOLHDL 6 06/07/2017           Hypertension: Not on any medications, followed by PCP May be anxious today  BP Readings from Last 3 Encounters:  09/14/17 120/82  04/29/17 (!) 142/92  04/28/17 130/80    Most recent eye exam was 1/18  Most recent foot exam: 1/18    LABS:  Office Visit on 09/14/2017  Component Date Value Ref Range Status  . Hemoglobin A1C 09/14/2017 8.6   Final  Lab on 09/09/2017  Component Date Value Ref Range Status  . Sodium 09/09/2017 135  135 - 145 mEq/L Final  . Potassium 09/09/2017 4.0  3.5 - 5.1 mEq/L Final  .  Chloride 09/09/2017 100  96 - 112 mEq/L Final  .  CO2 09/09/2017 30  19 - 32 mEq/L Final  . Glucose, Bld 09/09/2017 160* 70 - 99 mg/dL Final  . BUN 16/05/9603 15  6 - 23 mg/dL Final  . Creatinine, Ser 09/09/2017 1.14  0.40 - 1.50 mg/dL Final  . Calcium 54/04/8118 9.5  8.4 - 10.5 mg/dL Final  . GFR 14/78/2956 92.61  >60.00 mL/min Final    Physical Examination:  BP 120/82 (BP Location: Left Arm, Patient Position: Sitting, Cuff Size: Large)   Pulse 94   Ht 6' (1.829 m)   Wt 292 lb (132.5 kg)   SpO2 97%   BMI 39.60 kg/m   Repeat blood pressure unchanged with large cuff     ASSESSMENT:  Diabetes type 2, uncontrolled See history of present illness for detailed discussion of current diabetes management, blood sugar patterns and problems identified  His control is still consistently poor with A1c 8.6  Currently on basal insulin, Trulicity, Jardiance  Problems identified:  Although his blood sugars maybe better with taking Jardiance since his last visit he has not been taking all his medications consistently at least in December  Probably not checking blood sugars consistently  Difficulty losing weight despite taking Trulicity and Jardiance  Probably needs better meal planning and consultation with dietitian  Possible post prandial hyperglycemia which has not been assessed accurately    PLAN:    He is checking her sugars more consistently especially after meals  No change in medication regimen as yet  Referral to dietitian  He will call if he has persistently high postprandial readings  He will bring his monitor for review again in 2 months   Patient Instructions  Check blood sugars on waking up  3/7  Also check blood sugars about 2 hours after a meal and do this after different meals by rotation  Recommended blood sugar levels on waking up is 90-130 and about 2 hours after meal is 130-160  Please bring your blood sugar monitor to each visit, thank  you     Reather Littler 09/14/2017, 9:21 PM   Note: This office note was prepared with Dragon voice recognition system technology. Any transcriptional errors that result from this process are unintentional.

## 2017-09-14 NOTE — Patient Instructions (Signed)
Check blood sugars on waking up 3/7   Also check blood sugars about 2 hours after a meal and do this after different meals by rotation  Recommended blood sugar levels on waking up is 90-130 and about 2 hours after meal is 130-160  Please bring your blood sugar monitor to each visit, thank you  

## 2017-09-21 ENCOUNTER — Other Ambulatory Visit: Payer: Self-pay | Admitting: Endocrinology

## 2017-10-06 ENCOUNTER — Other Ambulatory Visit: Payer: Self-pay | Admitting: Endocrinology

## 2017-10-06 MED FILL — JARDIANCE 25 MG TABLET: 25 | 30 days supply | Qty: 30 | Fill #0

## 2017-10-06 MED FILL — TRULICITY 1.5 MG/0.5 ML PEN: 1.5 | 28 days supply | Qty: 2 | Fill #0

## 2017-10-06 MED FILL — LANTUS SOLOSTAR 100 UNITS/M: 100 | 23 days supply | Qty: 15 | Fill #2

## 2017-11-05 ENCOUNTER — Other Ambulatory Visit: Payer: Self-pay | Admitting: Endocrinology

## 2017-11-05 ENCOUNTER — Other Ambulatory Visit (INDEPENDENT_AMBULATORY_CARE_PROVIDER_SITE_OTHER): Payer: 59

## 2017-11-05 DIAGNOSIS — Z794 Long term (current) use of insulin: Secondary | ICD-10-CM | POA: Diagnosis not present

## 2017-11-05 DIAGNOSIS — E1165 Type 2 diabetes mellitus with hyperglycemia: Secondary | ICD-10-CM

## 2017-11-05 LAB — LIPID PANEL
CHOL/HDL RATIO: 6
Cholesterol: 179 mg/dL (ref 0–200)
HDL: 31.5 mg/dL — AB (ref >39.00)
NONHDL: 147.68
TRIGLYCERIDES: 206 mg/dL — AB (ref 0.0–149.0)
VLDL: 41.2 mg/dL — ABNORMAL HIGH (ref 0.0–40.0)

## 2017-11-05 LAB — BASIC METABOLIC PANEL
BUN: 11 mg/dL (ref 6–23)
CHLORIDE: 105 meq/L (ref 96–112)
CO2: 26 meq/L (ref 19–32)
Calcium: 9.5 mg/dL (ref 8.4–10.5)
Creatinine, Ser: 1.14 mg/dL (ref 0.40–1.50)
GFR: 92.53 mL/min (ref >60.00)
Glucose, Bld: 102 mg/dL — ABNORMAL HIGH (ref 70–99)
Potassium: 4 mEq/L (ref 3.5–5.1)
Sodium: 139 mEq/L (ref 135–145)

## 2017-11-05 LAB — MICROALBUMIN / CREATININE URINE RATIO
Creatinine,U: 186.4 mg/dL
Microalb Creat Ratio: 0.9 mg/g (ref 0.0–30.0)
Microalb, Ur: 1.8 mg/dL (ref 0.0–1.9)

## 2017-11-05 LAB — LDL CHOLESTEROL, DIRECT: Direct LDL: 101 mg/dL

## 2017-11-08 ENCOUNTER — Other Ambulatory Visit: Payer: Self-pay | Admitting: Endocrinology

## 2017-11-08 LAB — FRUCTOSAMINE: Fructosamine: 275 umol/L — ABNORMAL HIGH (ref 190–270)

## 2017-11-08 MED FILL — TRULICITY 1.5 MG/0.5 ML PEN: 1.5 | 28 days supply | Qty: 2 | Fill #0

## 2017-11-12 ENCOUNTER — Ambulatory Visit (INDEPENDENT_AMBULATORY_CARE_PROVIDER_SITE_OTHER): Payer: 59 | Admitting: Endocrinology

## 2017-11-12 ENCOUNTER — Encounter: Payer: Self-pay | Admitting: Endocrinology

## 2017-11-12 VITALS — BP 118/80 | HR 96 | Ht 72.0 in | Wt 287.0 lb

## 2017-11-12 DIAGNOSIS — E1165 Type 2 diabetes mellitus with hyperglycemia: Secondary | ICD-10-CM | POA: Diagnosis not present

## 2017-11-12 DIAGNOSIS — Z794 Long term (current) use of insulin: Secondary | ICD-10-CM

## 2017-11-12 MED ORDER — PRAVASTATIN SODIUM 40 MG PO TABS: 40.0000 mg | ORAL_TABLET | Freq: Every day | ORAL | 3 refills | Status: DC

## 2017-11-12 NOTE — Progress Notes (Signed)
Patient ID: Roy Jennings, male   DOB: Apr 24, 1981, 37 y.o.   MRN: [id number]            Reason for Appointment: Follow-up for Type 2 Diabetes   History of Present Illness:          Date of diagnosis of type 2 diabetes mellitus: 12/2011       Background history:   He was initially diagnosed with marked hyperglycemia and A1c of 13.4% He was apparently started on insulin at onset and has taken various insulin regimens in the past He has had difficulty tolerating metformin and has not taken this most of the time He has probably been on Lantus since about 08/2015, previously on the 70/30 insulin His level of control had improved initially in 2014 and 2015  Recent history:   INSULIN regimen is: 65  Lantus     Non-insulin hypoglycemic drugs the patient is taking are: Metformin ER 500 mg, Jardiance 25 mg daily, Trulicity 1.5 mg weekly  His A1c is 8.6 as of 2/19 and has been consistently over 8% Fructosamine is now 275, better than before  Current management, blood sugar patterns and problems identified:  He has checked his blood sugar only about 4 times in the last month  Also not clear if his meter is accurate or his test strips are within date as he is getting slightly higher readings compared to the lab reading; also appears to have the wrong time programmed on his monitor  However his blood sugar control is appearing to be significantly better, average recently is 146 compared to 220 on the previous visit  This may be related to his trying to be somewhat more consistent with his medications and diet  Does not appear to be checking readings after meals much at all and mostly in the morning        Side effects from medications have been: Diarrhea from higher doses of metformin  Compliance with the medical regimen: Improved Hypoglycemia:   none  Glucose monitoring:  done 0-1 times a day         Glucometer:  Freestyle   Blood Glucose readings by review of monitor show average of  146, mostly fasting readings recently about 155   Previous readings:  Mean values apply above for all meters except median for One Touch  PRE-MEAL Fasting Lunch Dinner Bedtime Overall  Glucose range: 159-299  284  261    Mean/median:     220     Self-care: The diet that the patient has been following is: Somewhat lower fat    Typical meal intake: Breakfast is eggs               Dietician visit, most recent: 08/2015, diabetes educator 3/18               Exercise: walking 60 min  Weight history:   Wt Readings from Last 3 Encounters:  11/12/17 287 lb (130.2 kg)  09/14/17 292 lb (132.5 kg)  04/29/17 296 lb 9.6 oz (134.5 kg)    Glycemic control:   Lab Results  Component Value Date   HGBA1C 8.6 09/14/2017   HGBA1C 8.3 (H) 06/07/2017   HGBA1C 8.7 04/29/2017   Lab Results  Component Value Date   MICROALBUR 1.8 11/05/2017   LDLCALC 83 04/28/2017   CREATININE 1.14 11/05/2017   Lab Results  Component Value Date   MICRALBCREAT 0.9 11/05/2017    Lab Results  Component Value Date   FRUCTOSAMINE  275 (H) 11/05/2017   FRUCTOSAMINE 291 (H) 06/07/2017   FRUCTOSAMINE 346 (H) 11/26/2016      Allergies as of 11/12/2017      Reactions   Metformin And Related    Diarrhea, nausea      Medication List        Accurate as of 11/12/17  4:56 PM. Always use your most recent med list.          aspirin EC 81 MG tablet Take 81 mg by mouth daily. Reported on 10/01/2015   empagliflozin 25 MG Tabs tablet Commonly known as:  JARDIANCE Take 25 mg by mouth daily.   JARDIANCE 25 MG Tabs tablet Generic drug:  empagliflozin TAKE 1 TABLET BY MOUTH DAILY   freestyle lancets Test twice a day   FREESTYLE LITE Devi Test twice a day   glucose blood test strip Commonly known as:  FREESTYLE LITE Test twice a day   Insulin Pen Needle 29G X 8MM Misc Use to inject insulin 1-3 times into skin daily   LANTUS SOLOSTAR 100 UNIT/ML Solostar Pen Generic drug:  Insulin Glargine INJECT  65 UNITS INTO THE SKIN DAILY   pravastatin 40 MG tablet Commonly known as:  PRAVACHOL Take 1 tablet (40 mg total) by mouth daily.   TRULICITY 1.5 MG/0.5ML Sopn Generic drug:  Dulaglutide INJECT 1 PEN INTO THE SKIN WEEKLY       Allergies:  Allergies  Allergen Reactions  . Metformin And Related     Diarrhea, nausea    Past Medical History:  Diagnosis Date  . Diabetes mellitus 12/30/11  . DKA (diabetic ketoacidoses) (HCC) 12/30/11   new dx DM a1c 13  . PVC (premature ventricular contraction)    LBB inferior axis QRS // [facility]  . Right bundle branch block     Past Surgical History:  Procedure Laterality Date  . KNEE ARTHROSCOPY  1999-2000   bilateral    Family History  Problem Relation Age of Onset  . Diabetes Father   . Diabetes Maternal Grandmother   . Hypertension Mother   . CAD Neg Hx     Social History:  reports that he has never smoked. He has never used smokeless tobacco. He reports that he drinks alcohol. He reports that he does not use drugs.   Review of Systems   Lipid history: His lipids are not optimally controlled Has high triglycerides, previously has had a prescription for Lipitor He did not the prescription and currently not on treatment    Lab Results  Component Value Date   CHOL 179 11/05/2017   HDL 31.50 (L) 11/05/2017   LDLCALC 83 04/28/2017   LDLDIRECT 101.0 11/05/2017   TRIG 206.0 (H) 11/05/2017   CHOLHDL 6 11/05/2017           Hypertension: Not on any medications, followed by PCP He is on Jardiance   BP Readings from Last 3 Encounters:  11/12/17 118/80  09/14/17 120/82  04/29/17 (!) 142/92    Most recent eye exam was 1/18  Most recent foot exam: 1/18    LABS:  No visits with results within 1 Week(s) from this visit.  Latest known visit with results is:  Lab on 11/05/2017  Component Date Value Ref Range Status  . Sodium 11/05/2017 139  135 - 145 mEq/L Final  . Potassium 11/05/2017 4.0  3.5  - 5.1 mEq/L Final  . Chloride 11/05/2017 105  96 - 112 mEq/L Final  . CO2 11/05/2017 26  19 -  32 mEq/L Final  . Glucose, Bld 11/05/2017 102* 70 - 99 mg/dL Final  . BUN 16/05/9603 11  6 - 23 mg/dL Final  . Creatinine, Ser 11/05/2017 1.14  0.40 - 1.50 mg/dL Final  . Calcium 54/04/8118 9.5  8.4 - 10.5 mg/dL Final  . GFR 14/78/2956 92.53  >60.00 mL/min Final  . Cholesterol 11/05/2017 179  0 - 200 mg/dL Final   ATP III Classification       Desirable:  < 200 mg/dL               Borderline High:  200 - 239 mg/dL          High:  > = 213 mg/dL  . Triglycerides 11/05/2017 206.0* 0.0 - 149.0 mg/dL Final   Normal:  <086 mg/dLBorderline High:  150 - 199 mg/dL  . HDL 11/05/2017 31.50* >39.00 mg/dL Final  . VLDL 57/84/6962 41.2* 0.0 - 40.0 mg/dL Final  . Total CHOL/HDL Ratio 11/05/2017 6   Final                  Men          Women1/2 Average Risk     3.4          3.3Average Risk          5.0          4.42X Average Risk          9.6          7.13X Average Risk          15.0          11.0                      . NonHDL 11/05/2017 147.68   Final   NOTE:  Non-HDL goal should be 30 mg/dL higher than patient's LDL goal (i.e. LDL goal of < 70 mg/dL, would have non-HDL goal of < 100 mg/dL)  . Fructosamine 11/05/2017 275* 190 - 270 umol/L Final  . Microalb, Ur 11/05/2017 1.8  0.0 - 1.9 mg/dL Final  . Creatinine,U 95/28/4132 186.4  mg/dL Final  . Microalb Creat Ratio 11/05/2017 0.9  0.0 - 30.0 mg/g Final  . Direct LDL 11/05/2017 101.0  mg/dL Final   Optimal:  <440 mg/dLNear or Above Optimal:  100-129 mg/dLBorderline High:  130-159 mg/dLHigh:  160-189 mg/dLVery High:  >190 mg/dL    Physical Examination:  BP 118/80 (BP Location: Left Arm, Patient Position: Sitting, Cuff Size: Large)   Pulse 96   Ht 6' (1.829 m)   Wt 287 lb (130.2 kg)   SpO2 96%   BMI 38.92 kg/m   Diabetic Foot Exam - Simple   Simple Foot Form Diabetic Foot exam was performed with the following findings:  Yes 11/12/2017  4:06 PM  Visual  Inspection No deformities, no ulcerations, no other skin breakdown bilaterally:  Yes Sensation Testing Intact to touch and monofilament testing bilaterally:  Yes Pulse Check Posterior Tibialis and Dorsalis pulse intact bilaterally:  Yes Comments         ASSESSMENT:  Diabetes type 2, with obesity  See history of present illness for detailed discussion of current diabetes management, blood sugar patterns and problems identified  His diabetes control is much better now with fructosamine upper normal  This is likely to be from is trying to do better with his diet, walking more regularly and also been consistent with his medications However he is checking blood sugar very  sporadically and not clear if his home readings are accurate he may have expired test strips  He is on basal insulin, Trulicity, Jardiance  Hyperlipidemia: He has not been on medication despite scribed Lipitor He does have mildly increased LDL but also diabetic dyslipidemia and likely has metabolic syndrome with abnormal LDL particles He does have increased cardiovascular risk and needs to be on a statin drug    PLAN:   He will need to start working with the health maintenance program associated with his employer and given him the name of the  [employer] program to enroll Start checking blood sugars consistently and make sure he is using an unexpired box of test strips Consistent diet To check blood sugars after meals also and not just in the morning Follow-up in 3 months  Pravastatin 40 mg daily  Patient Instructions  Sign up for [facility] program  Check blood sugars on waking up 3/7   Also check blood sugars about 2 hours after a meal and do this after different meals by rotation  Recommended blood sugar levels on waking up is 90-130 and about 2 hours after meal is 130-160  Please bring your blood sugar monitor to each visit, thank you     Reather Littler 11/12/2017, 4:56 PM   Note: This office note  was prepared with Dragon voice recognition system technology. Any transcriptional errors that result from this process are unintentional.

## 2017-11-12 NOTE — Patient Instructions (Addendum)
Sign [facility] for [facility]  Check blood sugars on waking up 3/7   Also check blood sugars about 2 hours after a meal and do this after different meals by rotation  Recommended blood sugar levels on waking up is 90-130 and about 2 hours after meal is 130-160  Please bring your blood sugar monitor to each visit, thank you

## 2017-12-01 NOTE — Progress Notes (Deleted)
   Subjective:    Patient ID: Roy Jennings, male    DOB: 11/16/1980, 37 y.o.   MRN: [id number]  HPI No chief complaint on file.  He is here for a complete physical exam. Previous medical care: Last CPE:  Other providers:  Past medical history: Surgeries:  Family history: Mental health history:  Social history: Lives with ***, works as ***,  *** Smoking, drinking alcohol, drug use Diet: *** Exercise: ***  Immunizations:  Health maintenance:  Colonoscopy: Last PSA: Last Dental Exam: Last Eye Exam:  Wears seatbelt always, uses sunscreen, smoke detectors in home and functioning, does not text while driving, feels safe in home environment.  Reviewed allergies, medications, past medical, surgical, family, and social history.   Review of Systems Review of Systems Constitutional: -fever, -chills, -sweats, -unexpected weight change,-fatigue ENT: -runny nose, -ear pain, -sore throat Cardiology:  -chest pain, -palpitations, -edema Respiratory: -cough, -shortness of breath, -wheezing Gastroenterology: -abdominal pain, -nausea, -vomiting, -diarrhea, -constipation  Hematology: -bleeding or bruising problems Musculoskeletal: -arthralgias, -myalgias, -joint swelling, -back pain Ophthalmology: -vision changes Urology: -dysuria, -difficulty urinating, -hematuria, -urinary frequency, -urgency Neurology: -headache, -weakness, -tingling, -numbness       Objective:   Physical Exam There were no vitals taken for this visit.  General Appearance:    Alert, cooperative, no distress, appears stated age  Head:    Normocephalic, without obvious abnormality, atraumatic  Eyes:    PERRL, conjunctiva/corneas clear, EOM's intact, fundi    benign  Ears:    Normal TM's and external ear canals  Nose:   Nares normal, mucosa normal, no drainage or sinus   tenderness  Throat:   Lips, mucosa, and tongue normal; teeth and gums normal  Neck:   Supple, no lymphadenopathy;  thyroid:  no    enlargement/tenderness/nodules; no carotid   bruit or JVD  Back:    Spine nontender, no curvature, ROM normal, no CVA     tenderness  Lungs:     Clear to auscultation bilaterally without wheezes, rales or     ronchi; respirations unlabored  Chest Wall:    No tenderness or deformity   Heart:    Regular rate and rhythm, S1 and S2 normal, no murmur, rub   or gallop  Breast Exam:    No chest wall tenderness, masses or gynecomastia  Abdomen:     Soft, non-tender, nondistended, normoactive bowel sounds,    no masses, no hepatosplenomegaly  Genitalia:    Normal male external genitalia without lesions.  Testicles without masses.  No inguinal hernias.  Rectal:   Deferred due to age <40 and lack of symptoms  Extremities:   No clubbing, cyanosis or edema  Pulses:   2+ and symmetric all extremities  Skin:   Skin color, texture, turgor normal, no rashes or lesions  Lymph nodes:   Cervical, supraclavicular, and axillary nodes normal  Neurologic:   CNII-XII intact, normal strength, sensation and gait; reflexes 2+ and symmetric throughout          Psych:   Normal mood, affect, hygiene and grooming.    Urinalysis dipstick:        Assessment & Plan:  Routine general medical examination at a health care facility

## 2017-12-02 ENCOUNTER — Encounter: Payer: 59 | Admitting: Family Medicine

## 2017-12-06 ENCOUNTER — Other Ambulatory Visit: Payer: Self-pay | Admitting: Endocrinology

## 2017-12-06 MED FILL — TRULICITY 1.5 MG/0.5 ML PEN: 1.5 | 28 days supply | Qty: 2 | Fill #0

## 2017-12-20 ENCOUNTER — Other Ambulatory Visit: Payer: Self-pay | Admitting: Endocrinology

## 2017-12-20 MED FILL — LANTUS SOLOSTAR 100 UNITS/M: 100 | 23 days supply | Qty: 15 | Fill #0

## 2018-01-04 ENCOUNTER — Other Ambulatory Visit: Payer: Self-pay | Admitting: Endocrinology

## 2018-01-04 MED FILL — TRULICITY 1.5 MG/0.5 ML PEN: 1.5 | 28 days supply | Qty: 2 | Fill #0

## 2018-02-07 ENCOUNTER — Other Ambulatory Visit: Payer: 59

## 2018-02-10 MED FILL — TRULICITY 1.5 MG/0.5 ML PEN: 1.5 | 28 days supply | Qty: 2 | Fill #1

## 2018-02-10 MED FILL — LANTUS SOLOSTAR 100 UNITS/M: 100 | 23 days supply | Qty: 15 | Fill #1

## 2018-02-18 ENCOUNTER — Other Ambulatory Visit: Payer: Self-pay

## 2018-02-18 ENCOUNTER — Ambulatory Visit (INDEPENDENT_AMBULATORY_CARE_PROVIDER_SITE_OTHER): Payer: 59 | Admitting: Endocrinology

## 2018-02-18 ENCOUNTER — Encounter: Payer: Self-pay | Admitting: Endocrinology

## 2018-02-18 ENCOUNTER — Other Ambulatory Visit: Payer: Self-pay | Admitting: Endocrinology

## 2018-02-18 VITALS — BP 142/86 | HR 86 | Ht 72.0 in | Wt 281.4 lb

## 2018-02-18 DIAGNOSIS — E1165 Type 2 diabetes mellitus with hyperglycemia: Secondary | ICD-10-CM | POA: Diagnosis not present

## 2018-02-18 DIAGNOSIS — Z794 Long term (current) use of insulin: Secondary | ICD-10-CM | POA: Diagnosis not present

## 2018-02-18 DIAGNOSIS — R030 Elevated blood-pressure reading, without diagnosis of hypertension: Secondary | ICD-10-CM

## 2018-02-18 DIAGNOSIS — E782 Mixed hyperlipidemia: Secondary | ICD-10-CM

## 2018-02-18 LAB — POCT GLYCOSYLATED HEMOGLOBIN (HGB A1C): Hemoglobin A1C: 12.4 % — AB (ref 4.0–5.6)

## 2018-02-18 LAB — GLUCOSE, POCT (MANUAL RESULT ENTRY): POC Glucose: 442 mg/dl — AB (ref 70–99)

## 2018-02-18 MED ORDER — GLUCOSE BLOOD VI STRP: ORAL_STRIP | 3 refills | Status: DC

## 2018-02-18 MED ORDER — ACCU-CHEK GUIDE W/DEVICE KIT: 1.0000 | PACK | Freq: Every day | 0 refills | Status: DC

## 2018-02-18 MED ORDER — INSULIN LISPRO 100 UNIT/ML (KWIKPEN): PEN_INJECTOR | SUBCUTANEOUS | 0 refills | Status: DC

## 2018-02-18 MED FILL — HUMALOG 100 UNITS/ML KWIKPE: 100 | 90 days supply | Qty: 15 | Fill #0

## 2018-02-18 MED FILL — FREESTYLE LITE METER: 1 days supply | Qty: 1 | Fill #0

## 2018-02-18 MED FILL — FREESTYLE LITE TEST STRIP: 50 days supply | Qty: 200 | Fill #0

## 2018-02-18 NOTE — Patient Instructions (Addendum)
Check blood sugars on waking up  3-4/7  Also check blood sugars about 2 hours after a meal and do this after different meals by rotation  Recommended blood sugar levels on waking up is 90-130 and about 2 hours after meal is 130-160  Please bring your blood sugar monitor to each visit, thank you  Humlaog 10 units just before supper daily  Sugars after supper stays over 200 can increase to 15 units  Restart Jardiance  Less fruits  If Morning sugars stays over 200 after 1 week then go up to 75 on Lantus

## 2018-02-18 NOTE — Progress Notes (Signed)
Patient ID: CRIMSON BEER, male   DOB: [birth year], 37 y.o.   MRN: [id number]            Reason for Appointment: Follow-up for Type 2 Diabetes   History of Present Illness:          Date of diagnosis of type 2 diabetes mellitus: 12/2011       Background history:   He was initially diagnosed with marked hyperglycemia and A1c of 13.4% He was apparently started on insulin at onset and has taken various insulin regimens in the past He has had difficulty tolerating metformin and has not taken this most of the time He has probably been on Lantus since about 08/2015, previously on the 70/30 insulin His level of control had improved initially in 2014 and 2015  Recent history:   INSULIN regimen is: 65 units Lantus     Non-insulin hypoglycemic drugs the patient is taking are:  Jardiance 25 mg daily, Trulicity 1.5 mg weekly  His A1c is now at 12.4 compared to 8.6 as of 2/19 and has been consistently over 8% Fructosamine previously was 275  Current management, blood sugar patterns and problems identified:  His blood sugars are not totally out of control  This is partly from his not taking Jardiance for about a month and he does not explain why  Periodically will run out of insulin and may not take it for up to a week  Also will periodically miss a dose of insulin including last evening; he says that if he forgets his insulin in the evening he will take it the next morning and probably is using insulin erratically  He thinks he is getting a true metrix meter from his insurance but he has difficulty using it and has not checked his sugars in the last 3 months  .  Although usually avoiding regular soft drinks today he had fruit punch  He has had increased thirst especially on some days  Not always cutting back on high fat foods  He was told to set up a follow-up with the wellness program at work for diabetes education and meal planning but he has not done so as he found this difficult  even though he has a phone number  He has lost some weight with his high sugars  No side effects from Trulicity which he thinks he is taking regularly  Glucose in the office was over 400 today after lunch        Side effects from medications have been: Diarrhea from higher doses of metformin  Compliance with the medical regimen: Improved Hypoglycemia:   none  Glucose monitoring:  done 0-1 times a day         Glucometer:  Freestyle   Blood Glucose readings not available   Self-care: The diet that the patient has been following is: None   Typical meal intake: Variable        Dietician visit, most recent: 08/2015, diabetes educator 3/18               Exercise: walking 60 min in the mornings frequently  Weight history:   Wt Readings from Last 3 Encounters:  02/18/18 281 lb 6.4 oz (127.6 kg)  11/12/17 287 lb (130.2 kg)  09/14/17 292 lb (132.5 kg)    Glycemic control:   Lab Results  Component Value Date   HGBA1C 12.4 (A) 02/18/2018   HGBA1C 8.6 09/14/2017   HGBA1C 8.3 (H) 06/07/2017   Lab Results  Component  Value Date   MICROALBUR 1.8 11/05/2017   LDLCALC 83 04/28/2017   CREATININE 1.14 11/05/2017   Lab Results  Component Value Date   MICRALBCREAT 0.9 11/05/2017    Lab Results  Component Value Date   FRUCTOSAMINE 275 (H) 11/05/2017   FRUCTOSAMINE 291 (H) 06/07/2017   FRUCTOSAMINE 346 (H) 11/26/2016      Allergies as of 02/18/2018      Reactions   Metformin And Related    Diarrhea, nausea      Medication List        Accurate as of 02/18/18  5:02 PM. Always use your most recent med list.          ACCU-CHEK GUIDE w/Device Kit 1 each by Does not apply route daily. USE METER TO CHECK BLOOD SUGAR.   aspirin EC 81 MG tablet Take 81 mg by mouth daily. Reported on 10/01/2015   empagliflozin 25 MG Tabs tablet Commonly known as:  JARDIANCE Take 25 mg by mouth daily.   JARDIANCE 25 MG Tabs tablet Generic drug:  empagliflozin TAKE 1 TABLET BY MOUTH  DAILY   freestyle lancets Test twice a day   glucose blood test strip Commonly known as:  ACCU-CHEK GUIDE Use as instructed to check blood sugar 4 times daily.   insulin lispro 100 UNIT/ML KiwkPen Commonly known as:  HUMALOG KWIKPEN 15 units before supper   Insulin Pen Needle 29G X 8MM Misc Use to inject insulin 1-3 times into skin daily   LANTUS SOLOSTAR 100 UNIT/ML Solostar Pen Generic drug:  Insulin Glargine INJECT 65 UNITS INTO THE SKIN DAILY   pravastatin 40 MG tablet Commonly known as:  PRAVACHOL Take 1 tablet (40 mg total) by mouth daily.   TRULICITY 1.5 QH/4.7ML Sopn Generic drug:  Dulaglutide INJECT 1 PEN INTO THE SKIN WEEKLY       Allergies:  Allergies  Allergen Reactions  . Metformin And Related     Diarrhea, nausea    Past Medical History:  Diagnosis Date  . Diabetes mellitus 12/30/11  . DKA (diabetic ketoacidoses) ([place]) 12/30/11   new dx DM a1c 13  . PVC (premature ventricular contraction)    LBB inferior axis QRS // followed by [name]  . Right bundle branch block     Past Surgical History:  Procedure Laterality Date  . KNEE ARTHROSCOPY  1999-2000   bilateral    Family History  Problem Relation Age of Onset  . Diabetes Father   . Diabetes Maternal Grandmother   . Hypertension Mother   . CAD Neg Hx     Social History:  reports that he has never smoked. He has never used smokeless tobacco. He reports that he drinks alcohol. He reports that he does not use drugs.   Review of Systems   Lipid history: His lipids are not optimally controlled Has high triglycerides, previously has had a prescription for Lipitor  LDL needs to be rechecked, he was prescribed pravastatin on his last visit   Lab Results  Component Value Date   CHOL 179 11/05/2017   HDL 31.50 (L) 11/05/2017   LDLCALC 83 04/28/2017   LDLDIRECT 101.0 11/05/2017   TRIG 206.0 (H) 11/05/2017   CHOLHDL 6 11/05/2017           Hypertension: Not on any medications,  followed by PCP He is on Jardiance   BP Readings from Last 3 Encounters:  02/18/18 (!) 142/86  11/12/17 118/80  09/14/17 120/82    Most recent eye exam was 1/18  Most recent foot exam: 1/18    LABS:  Office Visit on 02/18/2018  Component Date Value Ref Range Status  . Hemoglobin A1C 02/18/2018 12.4* 4.0 - 5.6 % Final  . POC Glucose 02/18/2018 442* 70 - 99 mg/dl Final    Physical Examination:  BP (!) 142/86 (BP Location: Left Arm, Patient Position: Sitting, Cuff Size: Normal)   Pulse 86   Ht 6' (1.829 m)   Wt 281 lb 6.4 oz (127.6 kg)   SpO2 97%   BMI 38.16 kg/m       ASSESSMENT:  Diabetes type 2, with obesity  See history of present illness for detailed discussion of current diabetes management, blood sugar patterns and problems identified  His diabetes control is significantly worse even though he was appearing to have better controlled on the last visit  This may be from his irregular medication compliance including Jardiance Also not taking his Lantus consistently Since his blood sugar was over 400 today after lunch he is likely is getting postprandial hypoglycemia also now  Discussed that he will need to be on basal bolus insulin to cover his mealtime spikes also now  LIPIDS: LDL needs to be rechecked, he was prescribed pravastatin on his last visit  PLAN:   Today discussed in detail the need for mealtime insulin to cover postprandial spikes, action of mealtime insulin, use of the insulin pen, timing and action of the rapid acting insulin as well as starting dose and dosage titration to target the two-hour reading of under 180 He will start with 10 units of Humalog at suppertime and after a few days go up to 15 units if postprandial readings are over 200 Start Jardiance back today Take Lantus at the same time as Humalog at suppertime and make sure he takes it every day Low-fat diet He will contact his wellness programs for meal planning and other  education Follow-up with diabetes educator next week Avoid drinks with sugar and cut down on portions of foods such as oranges Start using Accu-Chek meter and need to check his blood sugar at least twice a day at various times Lantus to be increased by 10 units if his morning sugars are not below 200 by next week  Counseling time on subjects discussed in assessment and plan sections is over 50% of today's 25 minute visit   Patient Instructions  Check blood sugars on waking up  3-4/7  Also check blood sugars about 2 hours after a meal and do this after different meals by rotation  Recommended blood sugar levels on waking up is 90-130 and about 2 hours after meal is 130-160  Please bring your blood sugar monitor to each visit, thank you  Humlaog 10 units just before supper daily  Sugars after supper stays over 200 can increase to 15 units  Restart Jardiance  Less fruits  If Morning sugars stays over 200 after 1 week then go up to 75 on Lantus    Elayne Snare 02/18/2018, 5:02 PM   Note: This office note was prepared with Dragon voice recognition system technology. Any transcriptional errors that result from this process are unintentional.

## 2018-02-23 ENCOUNTER — Encounter: Payer: 59 | Attending: Endocrinology | Admitting: Nutrition

## 2018-02-23 ENCOUNTER — Telehealth: Payer: Self-pay | Admitting: Nutrition

## 2018-02-23 DIAGNOSIS — IMO0001 Reserved for inherently not codable concepts without codable children: Secondary | ICD-10-CM

## 2018-02-23 DIAGNOSIS — Z713 Dietary counseling and surveillance: Secondary | ICD-10-CM | POA: Insufficient documentation

## 2018-02-23 DIAGNOSIS — Z794 Long term (current) use of insulin: Secondary | ICD-10-CM | POA: Insufficient documentation

## 2018-02-23 DIAGNOSIS — E1165 Type 2 diabetes mellitus with hyperglycemia: Secondary | ICD-10-CM | POA: Diagnosis not present

## 2018-02-23 NOTE — Telephone Encounter (Signed)
INCREASE Lantus to 75 units once a day in the morning Increase suppertime Humalog to 20 units and start 15 units of Humalog before lunch also

## 2018-02-23 NOTE — Telephone Encounter (Signed)
Patient is taking his Lantus: 65u q AM and 15u of Humalog acS.  Blood sugar readings the last 3 days: FBSs 198, 201, 199, 2hr. PcL: 198-301, and acS: 190, and HS: 195.

## 2018-02-24 NOTE — Telephone Encounter (Signed)
Called pt and left detailed voicemail with MD order change.

## 2018-03-04 NOTE — Progress Notes (Signed)
Patient reports that he is taking his Lantus ever evening and not forgetting this dose.  He is also taking 15u of Humalog before his supper dose.   He did not bring his meter, but says he is testing at different times.  FBS today was 199, yesterday it was 204.  Says 2hr. pc readings: L: today was 198, and can go up to 300..  2hr. pcS last night was 190, with FBS today was 199. Discussed how each insulin works and how to adjust the Humalog dose for varying carbohydrate meal sizes, and looking at the 2 hours readings to determine, if Humalog is needed to cover the meal.  Since his acL reading are high, it was suggested he take some Humalog ac to prevent the high readings.  He ate cereal and milk, and the 2hr. pc reading was 300. Also discussed other breakfast options that may keep his readings down, not requiring insulin for these meals, and how will know these.  He reported good understanding of this.  Discussed the idea of balanced meals and what foods were carbohydrates, and the idea of more Humalog insulin for meals containing more carbohydrate.  He reported good understanding of this, with no final questions.

## 2018-03-04 NOTE — Patient Instructions (Signed)
Adjust the 15u of Humalog up or down by 1-2u to get your 2hr. After meal blood sugar readings below 180.   Test blood sugar 2 hour after a meal, if if over 200, consider reducing carbohydrates for that meal, or the need for some Humalog insulin before that meal.

## 2018-03-11 ENCOUNTER — Telehealth: Payer: Self-pay | Admitting: Dietician

## 2018-03-11 MED FILL — TRULICITY 1.5 MG/0.5 ML PEN: 1.5 | 28 days supply | Qty: 2 | Fill #2

## 2018-03-11 NOTE — Telephone Encounter (Signed)
Brief Nutrition Note Returned patient call. Patient states that he is taking the insulin as prescribed Lantus 75 units each am, Novolog 15 units before lunch, and 20 units before dinner.   His blood sugar has been usually 110-150 with occasional high of 170 after dinner.  His numbers tend to be highest at 4:30 am when he takes the lantus- about 150. Patient with no questions at this time.   Patient to call for any needs.  Oran ReinLaura Truda Staub, RD, LDN, CDE

## 2018-03-17 MED FILL — UNIFINE PENTIPS 8MM 31G: 31G X 8 MM | 30 days supply | Qty: 100 | Fill #0

## 2018-03-17 MED FILL — LANTUS SOLOSTAR 100 UNITS/M: 100 | 23 days supply | Qty: 15 | Fill #2

## 2018-03-23 ENCOUNTER — Other Ambulatory Visit (INDEPENDENT_AMBULATORY_CARE_PROVIDER_SITE_OTHER): Payer: 59

## 2018-03-23 ENCOUNTER — Other Ambulatory Visit: Payer: Self-pay | Admitting: Endocrinology

## 2018-03-23 DIAGNOSIS — Z794 Long term (current) use of insulin: Secondary | ICD-10-CM

## 2018-03-23 DIAGNOSIS — E1165 Type 2 diabetes mellitus with hyperglycemia: Secondary | ICD-10-CM

## 2018-03-23 LAB — LIPID PANEL
CHOL/HDL RATIO: 6
Cholesterol: 200 mg/dL (ref 0–200)
HDL: 35 mg/dL — ABNORMAL LOW (ref >39.00)
NonHDL: 165.35
TRIGLYCERIDES: 319 mg/dL — AB (ref 0.0–149.0)
VLDL: 63.8 mg/dL — ABNORMAL HIGH (ref 0.0–40.0)

## 2018-03-23 LAB — COMPREHENSIVE METABOLIC PANEL
ALK PHOS: 79 U/L (ref 39–117)
ALT: 21 U/L (ref 0–53)
AST: 16 U/L (ref 0–37)
Albumin: 4.5 g/dL (ref 3.5–5.2)
BILIRUBIN TOTAL: 0.5 mg/dL (ref 0.2–1.2)
BUN: 11 mg/dL (ref 6–23)
CO2: 29 meq/L (ref 19–32)
CREATININE: 1.16 mg/dL (ref 0.40–1.50)
Calcium: 10 mg/dL (ref 8.4–10.5)
Chloride: 102 mEq/L (ref 96–112)
GFR: 90.51 mL/min (ref >60.00)
Glucose, Bld: 108 mg/dL — ABNORMAL HIGH (ref 70–99)
Potassium: 3.7 mEq/L (ref 3.5–5.1)
SODIUM: 138 meq/L (ref 135–145)
TOTAL PROTEIN: 8 g/dL (ref 6.0–8.3)

## 2018-03-23 LAB — LDL CHOLESTEROL, DIRECT: Direct LDL: 97 mg/dL

## 2018-03-24 LAB — FRUCTOSAMINE: Fructosamine: 292 umol/L — ABNORMAL HIGH (ref 0–285)

## 2018-03-28 ENCOUNTER — Encounter: Payer: Self-pay | Admitting: Endocrinology

## 2018-03-28 ENCOUNTER — Ambulatory Visit (INDEPENDENT_AMBULATORY_CARE_PROVIDER_SITE_OTHER): Payer: 59 | Admitting: Endocrinology

## 2018-03-28 ENCOUNTER — Ambulatory Visit: Payer: 59 | Admitting: Endocrinology

## 2018-03-28 VITALS — BP 148/96 | HR 97 | Ht 72.0 in | Wt 285.0 lb

## 2018-03-28 DIAGNOSIS — E1165 Type 2 diabetes mellitus with hyperglycemia: Secondary | ICD-10-CM | POA: Diagnosis not present

## 2018-03-28 DIAGNOSIS — Z794 Long term (current) use of insulin: Secondary | ICD-10-CM | POA: Diagnosis not present

## 2018-03-28 MED ORDER — INSULIN DEGLUDEC 200 UNIT/ML ~~LOC~~ SOPN: 76.0000 [IU] | PEN_INJECTOR | Freq: Every day | SUBCUTANEOUS | 1 refills | Status: DC

## 2018-03-28 NOTE — Progress Notes (Signed)
Patient ID: YON SCHIFFMAN, male   DOB: 1980/08/07, 37 y.o.   MRN: [id number]            Reason for Appointment: Follow-up for Type 2 Diabetes   History of Present Illness:          Date of diagnosis of type 2 diabetes mellitus: 12/2011       Background history:   He was initially diagnosed with marked hyperglycemia and A1c of 13.4% He was apparently started on insulin at onset and has taken various insulin regimens in the past He has had difficulty tolerating metformin and has not taken this most of the time He has probably been on Lantus since about 08/2015, previously on the 70/30 insulin His level of control had improved initially in 2014 and 2015  Recent history:   INSULIN regimen is: 75 units Lantus in am, Humalog 15 units before lunch-20  before supper   Non-insulin hypoglycemic drugs the patient is taking are:  Jardiance 25 mg daily, Trulicity 1.5 mg weekly  His A1c is 12.4 as of 7/19 and generally higher  Fructosamine previously was 275 and is now 292  Current management, blood sugar patterns and problems identified:  In July his blood sugars were poorly controlled and he was not taking his blood sugars at home, also was not having much energy with his high blood sugars  He was restarted on Jardiance as well as started on mealtime insulin with Humalog  Initially was told to take Humalog before suppertime and after reviewing his blood sugars with the nurse educator he was told to add 15 units before lunch also  Although he is using his freestyle meter he has checked blood sugars very infrequently and mostly in the mornings or early afternoon  Has no side effects from Trulicity or Jardiance  His morning blood sugars are mostly around 140 although 171 last Thursday  Afternoon readings range between 103-140 with only 3 readings, no readings after supper also  He also think that he is trying to take his insulin more consistently every morning with the Lantus and usually  taking it before his lunch and dinner as discussed before  Previously was eating cereal at breakfast and this has been stopped, eating mostly protein with eggs in the morning now  Subjectively is feeling better  With his improved blood sugars his weight is up 4 pounds and he is concerned about not losing any        Dinner usually at 7-8 pm,   Side effects from medications have been: Diarrhea from higher doses of metformin  Compliance with the medical regimen: Improved Hypoglycemia:   none  Glucose monitoring:  done 0-1 times a day         Glucometer:  Freestyle   Blood Glucose readings as above, overall 2-week average at home 139   Self-care: The diet that the patient has been following is: None   Typical meal intake: Variable        Dietician visit, most recent: 08/2015, diabetes educator in 7/19               Exercise: walking 60 min in the mornings   Weight history:   Wt Readings from Last 3 Encounters:  03/28/18 285 lb (129.3 kg)  02/18/18 281 lb 6.4 oz (127.6 kg)  11/12/17 287 lb (130.2 kg)    Glycemic control:   Lab Results  Component Value Date   HGBA1C 12.4 (A) 02/18/2018   HGBA1C 8.6 09/14/2017  HGBA1C 8.3 (H) 06/07/2017   Lab Results  Component Value Date   MICROALBUR 1.8 11/05/2017   LDLCALC 83 04/28/2017   CREATININE 1.16 03/23/2018   Lab Results  Component Value Date   MICRALBCREAT 0.9 11/05/2017    Lab Results  Component Value Date   FRUCTOSAMINE 292 (H) 03/23/2018   FRUCTOSAMINE 275 (H) 11/05/2017   FRUCTOSAMINE 291 (H) 06/07/2017      Allergies as of 03/28/2018      Reactions   Metformin And Related    Diarrhea, nausea      Medication List        Accurate as of 03/28/18 11:47 AM. Always use your most recent med list.          ACCU-CHEK GUIDE w/Device Kit 1 each by Does not apply route daily. USE METER TO CHECK BLOOD SUGAR.   aspirin EC 81 MG tablet Take 81 mg by mouth daily. Reported on 10/01/2015   empagliflozin 25 MG  Tabs tablet Commonly known as:  JARDIANCE Take 25 mg by mouth daily.   JARDIANCE 25 MG Tabs tablet Generic drug:  empagliflozin TAKE 1 TABLET BY MOUTH DAILY   freestyle lancets TEST TWICE A DAY   glucose blood test strip Use as instructed to check blood sugar 4 times daily.   insulin lispro 100 UNIT/ML KiwkPen Commonly known as:  HUMALOG 15 units before supper   Insulin Pen Needle 31G X 8 MM Misc USE TO INJECT INSULIN 1-3 TIMES INTO SKIN DAILY   LANTUS SOLOSTAR 100 UNIT/ML Solostar Pen Generic drug:  Insulin Glargine INJECT 65 UNITS INTO THE SKIN DAILY   pravastatin 40 MG tablet Commonly known as:  PRAVACHOL Take 1 tablet (40 mg total) by mouth daily.   TRULICITY 1.5 ZH/2.9JM Sopn Generic drug:  Dulaglutide INJECT 1 PEN INTO THE SKIN WEEKLY       Allergies:  Allergies  Allergen Reactions  . Metformin And Related     Diarrhea, nausea    Past Medical History:  Diagnosis Date  . Diabetes mellitus 12/30/11  . DKA (diabetic ketoacidoses) ([place]) 12/30/11   new dx DM a1c 13  . PVC (premature ventricular contraction)    LBB inferior axis QRS // followed by [name]  . Right bundle branch block     Past Surgical History:  Procedure Laterality Date  . KNEE ARTHROSCOPY  1999-2000   bilateral    Family History  Problem Relation Age of Onset  . Diabetes Father   . Diabetes Maternal Grandmother   . Hypertension Mother   . CAD Neg Hx     Social History:  reports that he has never smoked. He has never used smokeless tobacco. He reports that he drinks alcohol. He reports that he does not use drugs.   Review of Systems   Lipid history: His lipids are not optimally controlled Has high triglycerides, however LDL is better with taking pravastatin consistently recently   Lab Results  Component Value Date   CHOL 200 03/23/2018   HDL 35.00 (L) 03/23/2018   LDLCALC 83 04/28/2017   LDLDIRECT 97.0 03/23/2018   TRIG 319.0 (H) 03/23/2018   CHOLHDL 6  03/23/2018           Hypertension: Currently not on any medications, followed by PCP He is on Jardiance   BP Readings from Last 3 Encounters:  03/28/18 (!) 148/96  02/18/18 (!) 142/86  11/12/17 118/80    Most recent eye exam was 1/18  Most recent foot exam: 1/18  LABS:  Lab on 03/23/2018  Component Date Value Ref Range Status  . Cholesterol 03/23/2018 200  0 - 200 mg/dL Final   ATP III Classification       Desirable:  < 200 mg/dL               Borderline High:  200 - 239 mg/dL          High:  > = 240 mg/dL  . Triglycerides 03/23/2018 319.0* 0.0 - 149.0 mg/dL Final   Normal:  <150 mg/dLBorderline High:  150 - 199 mg/dL  . HDL 03/23/2018 35.00* >39.00 mg/dL Final  . VLDL 03/23/2018 63.8* 0.0 - 40.0 mg/dL Final  . Total CHOL/HDL Ratio 03/23/2018 6   Final                  Men          Women1/2 Average Risk     3.4          3.3Average Risk          5.0          4.42X Average Risk          9.6          7.13X Average Risk          15.0          11.0                      . NonHDL 03/23/2018 165.35   Final   NOTE:  Non-HDL goal should be 30 mg/dL higher than patient's LDL goal (i.e. LDL goal of < 70 mg/dL, would have non-HDL goal of < 100 mg/dL)  . Sodium 03/23/2018 138  135 - 145 mEq/L Final  . Potassium 03/23/2018 3.7  3.5 - 5.1 mEq/L Final  . Chloride 03/23/2018 102  96 - 112 mEq/L Final  . CO2 03/23/2018 29  19 - 32 mEq/L Final  . Glucose, Bld 03/23/2018 108* 70 - 99 mg/dL Final  . BUN 03/23/2018 11  6 - 23 mg/dL Final  . Creatinine, Ser 03/23/2018 1.16  0.40 - 1.50 mg/dL Final  . Total Bilirubin 03/23/2018 0.5  0.2 - 1.2 mg/dL Final  . Alkaline Phosphatase 03/23/2018 79  39 - 117 U/L Final  . AST 03/23/2018 16  0 - 37 U/L Final  . ALT 03/23/2018 21  0 - 53 U/L Final  . Total Protein 03/23/2018 8.0  6.0 - 8.3 g/dL Final  . Albumin 03/23/2018 4.5  3.5 - 5.2 g/dL Final  . Calcium 03/23/2018 10.0  8.4 - 10.5 mg/dL Final  . GFR 03/23/2018 90.51  >60.00 mL/min Final  .  Fructosamine 03/23/2018 292* 0 - 285 umol/L Final   Comment: Published reference interval for apparently healthy subjects between age 4 and 72 is 54 - 285 umol/L and in a poorly controlled diabetic population is 228 - 563 umol/L with a mean of 396 umol/L.   Marland Kitchen Direct LDL 03/23/2018 97.0  mg/dL Final   Optimal:  <100 mg/dLNear or Above Optimal:  100-129 mg/dLBorderline High:  130-159 mg/dLHigh:  160-189 mg/dLVery High:  >190 mg/dL    Physical Examination:  BP (!) 148/96 (BP Location: Left Arm, Patient Position: Sitting, Cuff Size: Normal)   Pulse 97   Ht 6' (1.829 m)   Wt 285 lb (129.3 kg)   SpO2 97%   BMI 38.65 kg/m       ASSESSMENT:  Diabetes type 2, with obesity  See history of present illness for detailed discussion of current diabetes management, blood sugar patterns and problems identified  His diabetes control is much better with his being on a consistent regimen with basal bolus insulin and taking Humalog with his main meals when he is not having carbohydrate He is also doing better with consistent use of Jardiance and Trulicity He does need to be continuing to improve his regimen and follow instructions given by diabetes educator for meal planning also  LIPIDS: LDL below 100 on pravastatin and he will continue, triglycerides were checked nonfasting    PLAN:    He will need to start checking his blood sugars at least every day once a day and rotate the testing times  He may be able to use the freestyle [place] system, not clear if this is covered  Discussed blood sugar targets both fasting and after meals  Since his Lantus may not be lasting 24 hours consistently will switch him to [place], not to start with the same dose  Asked him to call to adjust the dose of morning sugars are not consistent  Also discussed that his readings after meals should be at least under 180 and he will call if not controlled   Patient Instructions  Check blood sugars on waking up   3/7 days  Also check blood sugars about 2 hours after a meal and do this after different meals by rotation  Recommended blood sugar levels on waking up is 90-130 and about 2 hours after meal is 130-160  Please bring your blood sugar monitor to each visit, thank you  TRESIBA replaces lantus       Elayne Snare 03/28/2018, 11:47 AM   Note: This office note was prepared with Dragon voice recognition system technology. Any transcriptional errors that result from this process are unintentional.

## 2018-03-28 NOTE — Patient Instructions (Addendum)
Check blood sugars on waking up  3/7 days  Also check blood sugars about 2 hours after a meal and do this after different meals by rotation  Recommended blood sugar levels on waking up is 90-130 and about 2 hours after meal is 130-160  Please bring your blood sugar monitor to each visit, thank you  TRESIBA replaces lantus

## 2018-04-06 MED FILL — TRESIBA FLEXTOUCH 200 UNITS: 200 | 24 days supply | Qty: 9 | Fill #0

## 2018-04-06 MED FILL — TRULICITY 1.5 MG/0.5 ML PEN: 1.5 | 28 days supply | Qty: 2 | Fill #3

## 2018-05-09 ENCOUNTER — Other Ambulatory Visit: Payer: Self-pay | Admitting: Endocrinology

## 2018-05-09 MED FILL — TRULICITY 1.5 MG/0.5 ML PEN: 1.5 | 28 days supply | Qty: 2 | Fill #0

## 2018-05-09 MED FILL — TRESIBA FLEXTOUCH 200 UNITS: 200 | 24 days supply | Qty: 9 | Fill #1

## 2018-05-12 ENCOUNTER — Other Ambulatory Visit: Payer: Self-pay | Admitting: Endocrinology

## 2018-05-12 MED ORDER — INSULIN LISPRO 100 UNIT/ML (KWIKPEN): PEN_INJECTOR | SUBCUTANEOUS | 0 refills | Status: DC

## 2018-05-12 NOTE — Telephone Encounter (Signed)
Rx humolog 100 units/ml kwikpen disp-15, R-0 sent to [facility]

## 2018-05-23 MED FILL — HUMALOG 100 UNITS/ML KWIKPE: 100 | 80 days supply | Qty: 12 | Fill #0

## 2018-05-31 MED FILL — TRULICITY 1.5 MG/0.5 ML PEN: 1.5 | 28 days supply | Qty: 2 | Fill #1

## 2018-06-09 ENCOUNTER — Other Ambulatory Visit: Payer: Self-pay | Admitting: Endocrinology

## 2018-06-10 ENCOUNTER — Other Ambulatory Visit: Payer: Self-pay | Admitting: Endocrinology

## 2018-06-14 ENCOUNTER — Other Ambulatory Visit: Payer: Self-pay

## 2018-06-14 ENCOUNTER — Telehealth: Payer: Self-pay | Admitting: Endocrinology

## 2018-06-14 MED ORDER — INSULIN LISPRO (1 UNIT DIAL) 100 UNIT/ML (KWIKPEN): PEN_INJECTOR | SUBCUTANEOUS | 2 refills | Status: DC

## 2018-06-14 NOTE — Telephone Encounter (Signed)
Done

## 2018-06-14 NOTE — Telephone Encounter (Signed)
Patients wife called and scheduled an appointment for the patient and states he needs   insulin lispro (HUMALOG KWIKPEN) 100 UNIT/ML KiwkPen  Refilled. Please Advise, thanks

## 2018-06-15 ENCOUNTER — Other Ambulatory Visit: Payer: Self-pay | Admitting: Endocrinology

## 2018-06-16 MED FILL — TRESIBA FLEXTOUCH 200 UNITS: 200 | 24 days supply | Qty: 9 | Fill #0

## 2018-06-29 ENCOUNTER — Encounter: Payer: 59 | Admitting: Family Medicine

## 2018-06-29 ENCOUNTER — Telehealth: Payer: Self-pay | Admitting: Internal Medicine

## 2018-06-29 ENCOUNTER — Telehealth: Payer: Self-pay | Admitting: Family Medicine

## 2018-06-29 NOTE — Telephone Encounter (Signed)

## 2018-06-29 NOTE — Telephone Encounter (Signed)
Please charge no show fee. 

## 2018-06-29 NOTE — Telephone Encounter (Signed)
E 

## 2018-06-29 NOTE — Telephone Encounter (Signed)
dismissal letter is guarantor snapshot

## 2018-07-14 MED FILL — TRULICITY 1.5 MG/0.5 ML PEN: 1.5 | 28 days supply | Qty: 2 | Fill #2

## 2018-07-25 MED FILL — UNIFINE PENTIPS 8MM 31G: 31G X 8 MM | 30 days supply | Qty: 100 | Fill #1

## 2018-07-25 MED FILL — TRESIBA FLEXTOUCH 200 UNITS: 200 | 24 days supply | Qty: 9 | Fill #1

## 2018-07-26 MED FILL — HUMALOG 100 UNITS/ML KWIKPE: 100 | 90 days supply | Qty: 15 | Fill #0

## 2018-07-28 ENCOUNTER — Ambulatory Visit (INDEPENDENT_AMBULATORY_CARE_PROVIDER_SITE_OTHER): Payer: 59 | Admitting: Endocrinology

## 2018-07-28 ENCOUNTER — Encounter: Payer: Self-pay | Admitting: Endocrinology

## 2018-07-28 VITALS — BP 142/86 | HR 86 | Ht 72.0 in | Wt 289.2 lb

## 2018-07-28 DIAGNOSIS — Z794 Long term (current) use of insulin: Secondary | ICD-10-CM

## 2018-07-28 DIAGNOSIS — E1165 Type 2 diabetes mellitus with hyperglycemia: Secondary | ICD-10-CM

## 2018-07-28 DIAGNOSIS — Z23 Encounter for immunization: Secondary | ICD-10-CM | POA: Diagnosis not present

## 2018-07-28 LAB — POCT GLYCOSYLATED HEMOGLOBIN (HGB A1C): HEMOGLOBIN A1C: 11 % — AB (ref 4.0–5.6)

## 2018-07-28 LAB — GLUCOSE, POCT (MANUAL RESULT ENTRY): POC GLUCOSE: 287 mg/dL — AB (ref 70–99)

## 2018-07-28 MED ORDER — EMPAGLIFLOZIN 25 MG PO TABS: 25.0000 mg | ORAL_TABLET | Freq: Every day | ORAL | 3 refills | Status: DC

## 2018-07-28 NOTE — Progress Notes (Signed)
Patient ID: Roy Jennings, male   DOB: [birth year], 37 y.o.   MRN: [id number]            Reason for Appointment: Follow-up for Type 2 Diabetes   History of Present Illness:          Date of diagnosis of type 2 diabetes mellitus: 12/2011       Background history:   He was initially diagnosed with marked hyperglycemia and A1c of 13.4% He was apparently started on insulin at onset and has taken various insulin regimens in the past He has had difficulty tolerating metformin and has not taken this most of the time He has probably been on Lantus since about 08/2015, previously on the 70/30 insulin His level of control had improved initially in 2014 and 2015  Recent history:   INSULIN regimen is: 75 units  Tresiba in am, Humalog 0-15 units before lunch 0  before supper   Non-insulin hypoglycemic drugs the patient is taking are:  Jardiance 25 mg, apparently not taking, Trulicity 1.5 mg weekly  His A1c is 11% and only slightly better compared to 12.4  Current management, blood sugar patterns and problems identified:  He still has not been regular with his follow-up  Did not bring his meter and likely not checking his blood sugars much  He says he gets frustrated because his sugars are consistently high and usually over 200 in the morning  However he does not take his Humalog usually at mealtimes and mostly [place]; previously on Lantus  Also has not taken Jardiance for unknown reasons  Although he has seen the diabetes educator previously is still not understanding day-to-day management of his diabetes, role of mealtime insulin and blood sugar targets  He says he is trying to eat healthier although still eating out frequently especially in the evening  At dinnertime he is usually not at home and since he does not take his insulin with him only will not take the Humalog usually  He has been asked to check on the coverage for a freestyle libre but has not done so  His weight is  gradually increasing even despite taking Trulicity        Dinner usually at 7-8 pm,   Side effects from medications have been: Diarrhea from higher doses of metformin  Compliance with the medical regimen: Improved Hypoglycemia:   none  Glucose monitoring:  done 0-1 times a day         Glucometer:  Freestyle   Blood Glucose readings    Self-care: The diet that the patient has been following is: None   Typical meal intake: Variable        Dietician visit, most recent: 08/2015, diabetes educator in 7/19               Exercise: walking sometimes 60 min in the mornings   Weight history:   Wt Readings from Last 3 Encounters:  07/28/18 289 lb 3.2 oz (131.2 kg)  03/28/18 285 lb (129.3 kg)  02/18/18 281 lb 6.4 oz (127.6 kg)    Glycemic control:   Lab Results  Component Value Date   HGBA1C 11.0 (A) 07/28/2018   HGBA1C 12.4 (A) 02/18/2018   HGBA1C 8.6 09/14/2017   Lab Results  Component Value Date   MICROALBUR 1.8 11/05/2017   LDLCALC 83 04/28/2017   CREATININE 1.16 03/23/2018   Lab Results  Component Value Date   MICRALBCREAT 0.9 11/05/2017    Lab Results  Component Value Date  FRUCTOSAMINE 292 (H) 03/23/2018   FRUCTOSAMINE 275 (H) 11/05/2017   FRUCTOSAMINE 291 (H) 06/07/2017      Allergies as of 07/28/2018      Reactions   Metformin And Related    Diarrhea, nausea      Medication List       Accurate as of July 28, 2018  9:39 AM. Always use your most recent med list.        ACCU-CHEK GUIDE w/Device Kit 1 each by Does not apply route daily. USE METER TO CHECK BLOOD SUGAR.   aspirin EC 81 MG tablet Take 81 mg by mouth daily. Reported on 10/01/2015   empagliflozin 25 MG Tabs tablet Commonly known as:  JARDIANCE Take 25 mg by mouth daily.   freestyle lancets TEST TWICE A DAY   glucose blood test strip Commonly known as:  ACCU-CHEK GUIDE Use as instructed to check blood sugar 4 times daily.   insulin lispro 100 UNIT/ML KwikPen Commonly  known as:  HUMALOG KWIKPEN Inject 15 units daily with supper   Insulin Pen Needle 31G X 8 MM Misc Commonly known as:  UNIFINE PENTIPS USE TO INJECT INSULIN 1-3 TIMES INTO SKIN DAILY   pravastatin 40 MG tablet Commonly known as:  PRAVACHOL Take 1 tablet (40 mg total) by mouth daily.   TRESIBA FLEXTOUCH 200 UNIT/ML Sopn Generic drug:  Insulin Degludec INJECT 76 UNITS INTO THE SKIN DAILY BEFORE BREAKFAST.   TRULICITY 1.5 ZC/5.8IF Sopn Generic drug:  Dulaglutide INJECT 1 PEN INTO THE SKIN WEEKLY       Allergies:  Allergies  Allergen Reactions  . Metformin And Related     Diarrhea, nausea    Past Medical History:  Diagnosis Date  . Diabetes mellitus 12/30/11  . DKA (diabetic ketoacidoses) ([place]) 12/30/11   new dx DM a1c 13  . PVC (premature ventricular contraction)    LBB inferior axis QRS // followed by [name]  . Right bundle branch block     Past Surgical History:  Procedure Laterality Date  . KNEE ARTHROSCOPY  1999-2000   bilateral    Family History  Problem Relation Age of Onset  . Diabetes Father   . Diabetes Maternal Grandmother   . Hypertension Mother   . CAD Neg Hx     Social History:  reports that he has never smoked. He has never used smokeless tobacco. He reports current alcohol use. He reports that he does not use drugs.   Review of Systems   Lipid history: His lipids are not optimally controlled Has high triglycerides, however LDL is ok   Lab Results  Component Value Date   CHOL 200 03/23/2018   HDL 35.00 (L) 03/23/2018   LDLCALC 83 04/28/2017   LDLDIRECT 97.0 03/23/2018   TRIG 319.0 (H) 03/23/2018   CHOLHDL 6 03/23/2018           Hypertension: Currently not on any treatment, followed by PCP He was on Jardiance, not recently   BP Readings from Last 3 Encounters:  07/28/18 (!) 142/86  03/28/18 (!) 148/96  02/18/18 (!) 142/86    Most recent eye exam was 08/2017  Most recent foot exam: 11/2017    LABS:  Office Visit  on 07/28/2018  Component Date Value Ref Range Status  . Hemoglobin A1C 07/28/2018 11.0* 4.0 - 5.6 % Final  . POC Glucose 07/28/2018 287* 70 - 99 mg/dl Final    Physical Examination:  BP (!) 142/86 (BP Location: Left Arm, Patient Position: Sitting, Cuff Size:  Normal)   Pulse 86   Ht 6' (1.829 m)   Wt 289 lb 3.2 oz (131.2 kg)   SpO2 97%   BMI 39.22 kg/m       ASSESSMENT:  Diabetes type 2, with obesity  See history of present illness for detailed discussion of current diabetes management, blood sugar patterns and problems identified  His A1c is 11% This is higher again despite on the last visit his sugars being somewhat better with complying with his mealtime insulin and using Jardiance and Trulicity consistently Not clear if he is consistent with diet But currently his main issues seem to be not taking mealtime insulin coverage and probably getting more insulin resistant Not understanding that he can keep his Humalog pen at room temperature when needing for meals while eating out Recently has gained weight Does not get any significant diabetes education  He has been afraid of taking the influenza vaccine in the past  HYPERTENSION: This is mild but blood pressure is consistently high in the office  PLAN:    He will need to take his Humalog with him and take an injection with every meal  Discussed actions of mealtime insulin, timing of Humalog, postprandial targets and need for adjustment to keep his postprandial readings at least under 180  He will go up to 25 minutes for his dinner and 20 units for his breakfast and lunch for the Humalog for now  He will go up to 90 units on Tresiba for now and explained that this will be regulated further based on his morning readings  He will let us know if his blood sugars are consistently out of range  We will have him review day-to-day management in 2 weeks with nurse educator  May consider U-500 insulin if not well  controlled  Restart Jardiance and explained the benefits and actions of this medication  More regular exercise  Counseled him about the importance of taking the influenza vaccine and he agrees to take this   Patient Instructions  Check blood sugars on waking up 3 days a week  Also check blood sugars about 2 hours after meals and do this after different meals by rotation  Recommended blood sugar levels on waking up are 90-130 and about 2 hours after meal is 130-180  Please bring your blood sugar monitor to each visit, thank you  TRESIBA 90 UNITS AND call if am sugars stay <90 or >150  Humalog 20 units at Bfst and lunch and 25 at dinner  Jardiance daily    Counseling time on subjects discussed in assessment and plan sections is over 50% of today's 25 minute visit    Elayne Snare 07/28/2018, 9:39 AM   Note: This office note was prepared with Dragon voice recognition system technology. Any transcriptional errors that result from this process are unintentional.

## 2018-07-28 NOTE — Patient Instructions (Addendum)
Check blood sugars on waking up 3 days a week  Also check blood sugars about 2 hours after meals and do this after different meals by rotation  Recommended blood sugar levels on waking up are 90-130 and about 2 hours after meal is 130-180  Please bring your blood sugar monitor to each visit, thank you  TRESIBA 90 UNITS AND call if am sugars stay <90 or >150  Humalog 20 units at Bfst and lunch and 25 at dinner  Jardiance daily

## 2018-08-01 MED FILL — JARDIANCE 25 MG TABLET: 25 | 30 days supply | Qty: 30 | Fill #0

## 2018-08-09 ENCOUNTER — Encounter: Payer: 59 | Attending: Endocrinology | Admitting: Nutrition

## 2018-08-09 DIAGNOSIS — E119 Type 2 diabetes mellitus without complications: Secondary | ICD-10-CM | POA: Diagnosis not present

## 2018-08-09 DIAGNOSIS — Z794 Long term (current) use of insulin: Secondary | ICD-10-CM | POA: Diagnosis not present

## 2018-08-10 ENCOUNTER — Encounter: Payer: 59 | Admitting: Family Medicine

## 2018-08-10 NOTE — Patient Instructions (Addendum)
Take Humalog 10 min. Before all meals Take Tresiba every morning Call me Monday with morning blood sugar readings. Continue the exercise

## 2018-08-10 NOTE — Progress Notes (Signed)
Patient did not bring meter.  Says readings are all "high", and does not like to see those readings.   Typical day: 4AM: up take Tresiba: 90u 5AM: gym 20 minutes weight lifting, 20 min. Swimming, 20 minutes treadmil. 6:30 home usually no breakfast 10-11AM: Breakfast: 2 eggs, cheese, with toast and unsweet tea  NO humlalog 2-3PM: lunch:  NO humalog  Sandwich with water or unsweet tea to drink 7-8PM: supper, 4-6ounces protein, 2-3 non starchy veg., and 1 starchy veg. (30-45 grams).  unsweet tea to drink. Discussed with him that if he does not take the Humalog, he will put much stress on the pancrease to keep producing large amounts of insulin, which in turn, can make his insulin run out faster, and him to gain more weight.  He agreed to take the Humalog and to call me next week with morning blood sugar readings.  He was given some test strips to help with this. Stressed the need for getting blood sugars down for his family and himself.  He agreed to do this, and says will contact me on Monday.  He was given a log book to record the readings.   He was also given Nano needles for his Humalog pen, because he says the injections hurt.  Told him to keep the insulin pen out of the refrigerator and use the Nano needles.  He agree to do this.

## 2018-08-15 MED FILL — TRULICITY 1.5 MG/0.5 ML PEN: 1.5 | 28 days supply | Qty: 2 | Fill #3

## 2018-08-15 MED FILL — FREESTYLE LITE TEST STRIP: 50 days supply | Qty: 200 | Fill #1

## 2018-08-19 ENCOUNTER — Other Ambulatory Visit: Payer: Self-pay | Admitting: Endocrinology

## 2018-08-19 DIAGNOSIS — Z0100 Encounter for examination of eyes and vision without abnormal findings: Secondary | ICD-10-CM | POA: Diagnosis not present

## 2018-08-22 MED FILL — TRESIBA FLEXTOUCH 200 UNITS: 200 | 24 days supply | Qty: 9 | Fill #0

## 2018-08-31 ENCOUNTER — Other Ambulatory Visit (INDEPENDENT_AMBULATORY_CARE_PROVIDER_SITE_OTHER): Payer: 59

## 2018-08-31 DIAGNOSIS — Z794 Long term (current) use of insulin: Secondary | ICD-10-CM | POA: Diagnosis not present

## 2018-08-31 DIAGNOSIS — E1165 Type 2 diabetes mellitus with hyperglycemia: Secondary | ICD-10-CM

## 2018-08-31 LAB — COMPREHENSIVE METABOLIC PANEL
ALBUMIN: 4.3 g/dL (ref 3.5–5.2)
ALT: 19 U/L (ref 0–53)
AST: 20 U/L (ref 0–37)
Alkaline Phosphatase: 74 U/L (ref 39–117)
BUN: 11 mg/dL (ref 6–23)
CO2: 25 mEq/L (ref 19–32)
Calcium: 9.6 mg/dL (ref 8.4–10.5)
Chloride: 105 mEq/L (ref 96–112)
Creatinine, Ser: 1.1 mg/dL (ref 0.40–1.50)
GFR: 90.33 mL/min (ref >60.00)
Glucose, Bld: 95 mg/dL (ref 70–99)
Potassium: 3.7 mEq/L (ref 3.5–5.1)
Sodium: 139 mEq/L (ref 135–145)
Total Bilirubin: 0.5 mg/dL (ref 0.2–1.2)
Total Protein: 7.4 g/dL (ref 6.0–8.3)

## 2018-08-31 LAB — BASIC METABOLIC PANEL
BUN: 11 mg/dL (ref 6–23)
CHLORIDE: 105 meq/L (ref 96–112)
CO2: 25 mEq/L (ref 19–32)
CREATININE: 1.1 mg/dL (ref 0.40–1.50)
Calcium: 9.6 mg/dL (ref 8.4–10.5)
GFR: 90.33 mL/min (ref >60.00)
Glucose, Bld: 95 mg/dL (ref 70–99)
Potassium: 3.7 mEq/L (ref 3.5–5.1)
Sodium: 139 mEq/L (ref 135–145)

## 2018-08-31 LAB — HEMOGLOBIN A1C: Hgb A1c MFr Bld: 10 % — ABNORMAL HIGH (ref 4.6–6.5)

## 2018-09-01 LAB — FRUCTOSAMINE: FRUCTOSAMINE: 283 umol/L (ref 0–285)

## 2018-09-07 ENCOUNTER — Ambulatory Visit: Payer: 59 | Admitting: Endocrinology

## 2018-09-08 ENCOUNTER — Ambulatory Visit (INDEPENDENT_AMBULATORY_CARE_PROVIDER_SITE_OTHER): Payer: 59 | Admitting: Endocrinology

## 2018-09-08 ENCOUNTER — Other Ambulatory Visit: Payer: Self-pay | Admitting: Endocrinology

## 2018-09-08 ENCOUNTER — Encounter: Payer: Self-pay | Admitting: Endocrinology

## 2018-09-08 VITALS — BP 138/76 | HR 100 | Ht 72.0 in | Wt 291.6 lb

## 2018-09-08 DIAGNOSIS — Z794 Long term (current) use of insulin: Secondary | ICD-10-CM | POA: Diagnosis not present

## 2018-09-08 DIAGNOSIS — E1165 Type 2 diabetes mellitus with hyperglycemia: Secondary | ICD-10-CM

## 2018-09-08 LAB — GLUCOSE, POCT (MANUAL RESULT ENTRY): POC Glucose: 121 mg/dl — AB (ref 70–99)

## 2018-09-08 MED ORDER — INSULIN DEGLUDEC 200 UNIT/ML ~~LOC~~ SOPN: 86.0000 [IU] | PEN_INJECTOR | Freq: Every day | SUBCUTANEOUS | 1 refills | Status: DC

## 2018-09-08 MED FILL — JARDIANCE 25 MG TABLET: 25 | 30 days supply | Qty: 30 | Fill #1

## 2018-09-08 MED FILL — TRESIBA FLEXTOUCH 200 UNITS: 200 | 28 days supply | Qty: 12 | Fill #0

## 2018-09-08 MED FILL — UNIFINE PENTIPS 32GX5/32: 32G X 4 MM | 33 days supply | Qty: 100 | Fill #0

## 2018-09-08 MED FILL — TRULICITY 1.5 MG/0.5 ML PEN: 1.5 | 28 days supply | Qty: 2 | Fill #0

## 2018-09-08 MED FILL — UNIFINE PENTIPS 32GX5/32": 32G X 4 MM | 33 days supply | Qty: 100 | Fill #0

## 2018-09-08 NOTE — Progress Notes (Signed)
Patient ID: Roy Jennings, male   DOB: 1980-11-12, 38 y.o.   MRN: [id number]            Reason for Appointment: Follow-up for Type 2 Diabetes   History of Present Illness:          Date of diagnosis of type 2 diabetes mellitus: 12/2011       Background history:   He was initially diagnosed with marked hyperglycemia and A1c of 13.4% He was apparently started on insulin at onset and has taken various insulin regimens in the past He has had difficulty tolerating metformin and has not taken this most of the time He has probably been on Lantus since about 08/2015, previously on the 70/30 insulin His level of control had improved initially in 2014 and 2015  Recent history:   INSULIN regimen is: 90 units  Tresiba in am, Humalog 15 units before lunch 30  before supper   Non-insulin hypoglycemic drugs the patient is taking are:  Jardiance 25 mg taking, Trulicity 1.5 mg weekly  His A1c is 10% % and fructosamine is improved at 283   Current management, blood sugar patterns and problems identified:  He was told to start Jardiance on his last visit and he has done so.  Also [name] was increased by 15 units since he reported blood sugars over 200 in the morning  However he did not bring his blood sugar meter and again not clear how often he is checking it.  He says the meter was not working and is now starting to work after replacing the battery.  He was seen by the diabetes educator also and on his own has increased the Humalog to 30 units at suppertime  However not clear if his blood sugars are controlled after meals as he does not monitor them  However he does not report any hypoglycemia  He has gained 2 pounds since his last visit despite increasing his exercise and he thinks that he is walking 20,000 steps every other day   Still taking Trulicity Recently lab glucose was 95 fasting       Dinner usually at 7-8 pm   Side effects from medications have been: Diarrhea from higher  doses of metformin  Compliance with the medical regimen: Improved Hypoglycemia:   none  Glucose monitoring:  done 0-1 times a day         Glucometer:  Freestyle   Blood Glucose readings not available   Self-care: The diet that the patient has been following is: None   Typical meal intake: Variable        Dietician visit, most recent: 08/2015, diabetes educator in 7/19               Exercise: walking at [facility] 60 min in the mornings   Weight history:   Wt Readings from Last 3 Encounters:  09/08/18 291 lb 9.6 oz (132.3 kg)  07/28/18 289 lb 3.2 oz (131.2 kg)  03/28/18 285 lb (129.3 kg)    Glycemic control:   Lab Results  Component Value Date   HGBA1C 10.0 (H) 08/31/2018   HGBA1C 11.0 (A) 07/28/2018   HGBA1C 12.4 (A) 02/18/2018   Lab Results  Component Value Date   MICROALBUR 1.8 11/05/2017   LDLCALC 83 04/28/2017   CREATININE 1.10 08/31/2018   CREATININE 1.10 08/31/2018   Lab Results  Component Value Date   MICRALBCREAT 0.9 11/05/2017    Lab Results  Component Value Date   FRUCTOSAMINE 283 08/31/2018  FRUCTOSAMINE 292 (H) 03/23/2018   FRUCTOSAMINE 275 (H) 11/05/2017      Allergies as of 09/08/2018      Reactions   Metformin And Related    Diarrhea, nausea      Medication List       Accurate as of September 08, 2018  1:52 PM. Always use your most recent med list.        ACCU-CHEK GUIDE w/Device Kit 1 each by Does not apply route daily. USE METER TO CHECK BLOOD SUGAR.   aspirin EC 81 MG tablet Take 81 mg by mouth daily. Reported on 10/01/2015   empagliflozin 25 MG Tabs tablet Commonly known as:  JARDIANCE Take 25 mg by mouth daily.   freestyle lancets TEST TWICE A DAY   glucose blood test strip Commonly known as:  ACCU-CHEK GUIDE Use as instructed to check blood sugar 4 times daily.   Insulin Degludec 200 UNIT/ML Sopn Commonly known as:  TRESIBA FLEXTOUCH Inject 86 Units into the skin daily.   insulin lispro 100 UNIT/ML KwikPen Commonly known  as:  HUMALOG KWIKPEN Inject 15 units daily with supper   Insulin Pen Needle 31G X 8 MM Misc Commonly known as:  UNIFINE PENTIPS USE TO INJECT INSULIN 1-3 TIMES INTO SKIN DAILY   pravastatin 40 MG tablet Commonly known as:  PRAVACHOL Take 1 tablet (40 mg total) by mouth daily.   TRULICITY 1.5 DP/8.2UM Sopn Generic drug:  Dulaglutide INJECT 1 PEN INTO THE SKIN WEEKLY       Allergies:  Allergies  Allergen Reactions  . Metformin And Related     Diarrhea, nausea    Past Medical History:  Diagnosis Date  . Diabetes mellitus 12/30/11  . DKA (diabetic ketoacidoses) ([place]) 12/30/11   new dx DM a1c 13  . PVC (premature ventricular contraction)    LBB inferior axis QRS // followed by [name]  . Right bundle branch block     Past Surgical History:  Procedure Laterality Date  . KNEE ARTHROSCOPY  1999-2000   bilateral    Family History  Problem Relation Age of Onset  . Diabetes Father   . Diabetes Maternal Grandmother   . Hypertension Mother   . CAD Neg Hx     Social History:  reports that he has never smoked. He has never used smokeless tobacco. He reports current alcohol use. He reports that he does not use drugs.   Review of Systems   Lipid history: His lipids are not optimally controlled Has high triglycerides, however LDL is below 100   Lab Results  Component Value Date   CHOL 200 03/23/2018   HDL 35.00 (L) 03/23/2018   LDLCALC 83 04/28/2017   LDLDIRECT 97.0 03/23/2018   TRIG 319.0 (H) 03/23/2018   CHOLHDL 6 03/23/2018           Hypertension: Currently not on any treatment, followed by PCP He is on Jardiance and blood pressure appears better.  Also he is doing more exercise   BP Readings from Last 3 Encounters:  09/08/18 138/76  07/28/18 (!) 142/86  03/28/18 (!) 148/96    Most recent eye exam was 08/2017  Most recent foot exam: 11/2017    LABS:  Office Visit on 09/08/2018  Component Date Value Ref Range Status  . POC Glucose  09/08/2018 121* 70 - 99 mg/dl Final    Physical Examination:  BP 138/76 (BP Location: Left Arm, Patient Position: Sitting, Cuff Size: Large)   Pulse 100   Ht 6' (  1.829 m)   Wt 291 lb 9.6 oz (132.3 kg)   SpO2 96%   BMI 39.55 kg/m       ASSESSMENT:  Diabetes type 2, with obesity  See history of present illness for detailed discussion of current diabetes management, blood sugar patterns and problems identified  His A1c is 10 % However fructosamine is relatively good at 283  With increasing his insulin and restarting Jardiance his blood sugars are better However difficult to assess his blood sugar patterns and how to adjust his insulin based on blood sugar values which he has not monitored lately Mostly difficult to assess his mealtime insulin requirement With this fasting glucose of 95 he likely is getting adequate basal insulin with 90 units of C1 Also benefiting from consultation with dietitian and restarting Jardiance Also has been more motivated to exercise Achieving weight loss is still a problem   HYPERTENSION: This is mild but blood pressure is better with lifestyle changes and Jardiance    PLAN:    He will need to check his blood sugars regularly and discuss targets after meals also  For now he can reduce his [place] by 4 units and another 4 units if his morning sugars stay below 100  If he is having blood sugars out of range after meals he will let us know  A1c again in 3 months   Patient Instructions  Tresiba 86 bunits daily  Check blood sugars on waking up 5 days a week  Also check blood sugars about 2 hours after meals and do this after different meals by rotation  Recommended blood sugar levels on waking up are 90-130 and about 2 hours after meal is 130-160  Please bring your blood sugar monitor to each visit, thank you     Elayne Snare 09/08/2018, 1:52 PM   Note: This office note was prepared with Dragon voice recognition system technology. Any  transcriptional errors that result from this process are unintentional.

## 2018-09-08 NOTE — Patient Instructions (Signed)
Tresiba 86 bunits daily  Check blood sugars on waking up 5 days a week  Also check blood sugars about 2 hours after meals and do this after different meals by rotation  Recommended blood sugar levels on waking up are 90-130 and about 2 hours after meal is 130-160  Please bring your blood sugar monitor to each visit, thank you

## 2018-09-28 ENCOUNTER — Other Ambulatory Visit: Payer: 59

## 2018-10-04 MED FILL — TRULICITY 1.5 MG/0.5 ML PEN: 1.5 | 28 days supply | Qty: 2 | Fill #1 | Status: TO

## 2018-10-19 MED FILL — TRESIBA FLEXTOUCH 200 UNITS: 200 | 28 days supply | Qty: 12 | Fill #1

## 2018-10-19 MED FILL — JARDIANCE 25 MG TABLET: 25 | 30 days supply | Qty: 30 | Fill #2 | Status: TO

## 2018-11-09 MED FILL — TRULICITY 1.5 MG/0.5 ML PEN: 1.5 | 28 days supply | Qty: 2 | Fill #0

## 2018-11-10 ENCOUNTER — Other Ambulatory Visit: Payer: Self-pay | Admitting: Endocrinology

## 2018-11-10 MED FILL — TRESIBA FLEXTOUCH 200 UNITS: 200 | 28 days supply | Qty: 12 | Fill #0

## 2018-11-28 ENCOUNTER — Other Ambulatory Visit: Payer: Self-pay

## 2018-12-06 MED FILL — TRESIBA FLEXTOUCH 200 UNITS: 200 | 28 days supply | Qty: 12 | Fill #1

## 2018-12-06 MED FILL — TRULICITY 1.5 MG/0.5 ML PEN: 1.5 | 28 days supply | Qty: 2 | Fill #1

## 2018-12-06 MED FILL — JARDIANCE 25 MG TABLET: 25 | 30 days supply | Qty: 30 | Fill #0

## 2018-12-12 ENCOUNTER — Other Ambulatory Visit: Payer: 59

## 2018-12-14 ENCOUNTER — Other Ambulatory Visit: Payer: Self-pay

## 2018-12-14 ENCOUNTER — Ambulatory Visit (INDEPENDENT_AMBULATORY_CARE_PROVIDER_SITE_OTHER): Payer: 59 | Admitting: Endocrinology

## 2018-12-14 ENCOUNTER — Encounter: Payer: Self-pay | Admitting: Endocrinology

## 2018-12-14 VITALS — BP 122/84 | HR 119 | Ht 72.0 in | Wt 274.4 lb

## 2018-12-14 DIAGNOSIS — E1165 Type 2 diabetes mellitus with hyperglycemia: Secondary | ICD-10-CM | POA: Diagnosis not present

## 2018-12-14 DIAGNOSIS — Z794 Long term (current) use of insulin: Secondary | ICD-10-CM

## 2018-12-14 LAB — POCT GLYCOSYLATED HEMOGLOBIN (HGB A1C): Hemoglobin A1C: 7.4 % — AB (ref 4.0–5.6)

## 2018-12-14 LAB — GLUCOSE, POCT (MANUAL RESULT ENTRY): POC Glucose: 134 mg/dl — AB (ref 70–99)

## 2018-12-14 MED ORDER — FREESTYLE FREEDOM LITE W/DEVICE KIT: PACK | 0 refills | Status: AC

## 2018-12-14 MED ORDER — GLUCOSE BLOOD VI STRP: ORAL_STRIP | 3 refills | Status: AC

## 2018-12-14 MED ORDER — FREESTYLE LANCETS MISC: 1.0000 | Freq: Two times a day (BID) | 12 refills | Status: AC

## 2018-12-14 MED FILL — FREESTYLE LANCETS: 90 days supply | Qty: 200 | Fill #0

## 2018-12-14 MED FILL — FREESTYLE LITE METER: 1 days supply | Qty: 1 | Fill #0

## 2018-12-14 MED FILL — FREESTYLE LITE TEST STRIP: 50 days supply | Qty: 100 | Fill #0

## 2018-12-14 NOTE — Progress Notes (Signed)
Patient ID: Roy Jennings, male   DOB: 09-01-1980, 38 y.o.   MRN: [id number]            Reason for Appointment: Follow-up for Type 2 Diabetes   History of Present Illness:          Date of diagnosis of type 2 diabetes mellitus: 12/2011       Background history:   He was initially diagnosed with marked hyperglycemia and A1c of 13.4% He was apparently started on insulin at onset and has taken various insulin regimens in the past He has had difficulty tolerating metformin and has not taken this most of the time He has probably been on Lantus since about 08/2015, previously on the 70/30 insulin His level of control had improved initially in 2014 and 2015  Recent history:   INSULIN regimen is: 86 units  Tresiba in am, Humalog irregularly 20  before supper   Non-insulin hypoglycemic drugs the patient is taking are:  Jardiance 25 mg taking, Trulicity 1.5 mg weekly  His A1c is 10% % and fructosamine is improved at 283   Current management, blood sugar patterns and problems identified:  He was told to start Jardiance on his last visit and he thinks he is taking this regularly now  He claims that his meter was not working properly and did not bring it even though his pharmacy thought it was functioning  As before he is likely not checking his blood sugars much and reportedly mostly fasting  He was also supposed to take Humalog consistently before lunch and dinner but he did not like taking many injections.  He will sometimes take Humalog at suppertime but only 20 units compared to the previous dose of 30 units that was discussed  He thinks he is taking his [place] and Trulicity regularly  Also [name] was increased by 15 units since he reported blood sugars over 200 in the morning  Surprisingly his weight is down almost 20 pounds over the last 3 months  He thinks that he is generally watching his diet and not always getting high fat or high carbohydrate meals  Today after lunch  with eating only low-fat chicken meal from a fast food restaurant he has a glucose of 134  Also no hypoglycemia reported         Dinner usually at 7-8 pm   Side effects from medications have been: Diarrhea from most doses of metformin  Compliance with the medical regimen: Improved Hypoglycemia:   none  Glucose monitoring:  done 0-1 times a day         Glucometer: ?  Accu-Chek   Blood Glucose readings usually about 120 in Am   Self-care: The diet that the patient has been following is: None   Typical meal intake: Variable        Dietician visit, most recent: 08/2015, diabetes educator in 7/19               Exercise: walking up to 60 min in the mornings   Weight history:   Wt Readings from Last 3 Encounters:  12/14/18 274 lb 6.4 oz (124.5 kg)  09/08/18 291 lb 9.6 oz (132.3 kg)  07/28/18 289 lb 3.2 oz (131.2 kg)    Glycemic control:   Lab Results  Component Value Date   HGBA1C 7.4 (A) 12/14/2018   HGBA1C 10.0 (H) 08/31/2018   HGBA1C 11.0 (A) 07/28/2018   Lab Results  Component Value Date   MICROALBUR 1.8 11/05/2017  LDLCALC 83 04/28/2017   CREATININE 1.10 08/31/2018   CREATININE 1.10 08/31/2018   Lab Results  Component Value Date   MICRALBCREAT 0.9 11/05/2017    Lab Results  Component Value Date   FRUCTOSAMINE 283 08/31/2018   FRUCTOSAMINE 292 (H) 03/23/2018   FRUCTOSAMINE 275 (H) 11/05/2017      Allergies as of 12/14/2018      Reactions   Metformin And Related    Diarrhea, nausea      Medication List       Accurate as of Dec 14, 2018  8:54 PM. If you have any questions, ask your nurse or doctor.        aspirin EC 81 MG tablet Take 81 mg by mouth daily. Reported on 10/01/2015   empagliflozin 25 MG Tabs tablet Commonly known as:  Jardiance Take 25 mg by mouth daily.   FreeStyle Freedom Lite w/Device Kit Use as instructed to check blood sugar twice daily. What changed:    how much to take  how to take this  when to take this   Another medication with the same name was removed. Continue taking this medication, and follow the directions you see here. Changed by:  Jayme Cloud, LPN   freestyle lancets 1 each by Other route 2 (two) times daily. for testing What changed:    how much to take  how to take this Changed by:  Jayme Cloud, LPN   glucose blood test strip Commonly known as:  FREESTYLE LITE Use as instructed to check blood sugar twice daily. What changed:    how much to take  how to take this  when to take this  Another medication with the same name was removed. Continue taking this medication, and follow the directions you see here. Changed by:  Jayme Cloud, LPN   insulin lispro 100 UNIT/ML KwikPen Commonly known as:  HumaLOG KwikPen Inject 15 units daily with supper What changed:  additional instructions   Insulin Pen Needle 31G X 8 MM Misc Commonly known as:  Unifine Pentips USE TO INJECT INSULIN 1-3 TIMES INTO SKIN DAILY   pravastatin 40 MG tablet Commonly known as:  PRAVACHOL Take 1 tablet (40 mg total) by mouth daily.   [name] FlexTouch 200 UNIT/ML Sopn Generic drug:  Insulin Degludec INJECT 86 UNITS INTO THE SKIN DAILY.   Trulicity 1.5 TD/3.2KG Sopn Generic drug:  Dulaglutide INJECT 1 PEN INTO THE SKIN WEEKLY       Allergies:  Allergies  Allergen Reactions  . Metformin And Related     Diarrhea, nausea    Past Medical History:  Diagnosis Date  . Diabetes mellitus 12/30/11  . DKA (diabetic ketoacidoses) ([place]) 12/30/11   new dx DM a1c 13  . PVC (premature ventricular contraction)    LBB inferior axis QRS // followed by [name]  . Right bundle branch block     Past Surgical History:  Procedure Laterality Date  . KNEE ARTHROSCOPY  1999-2000   bilateral    Family History  Problem Relation Age of Onset  . Diabetes Father   . Diabetes Maternal Grandmother   . Hypertension Mother   . CAD Neg Hx     Social History:  reports that he has never  smoked. He has never used smokeless tobacco. He reports current alcohol use. He reports that he does not use drugs.   Review of Systems   Lipid history: His lipids are not optimally controlled, off  Has high triglycerides, however  LDL is below 100   Lab Results  Component Value Date   CHOL 200 03/23/2018   HDL 35.00 (L) 03/23/2018   LDLCALC 83 04/28/2017   LDLDIRECT 97.0 03/23/2018   TRIG 319.0 (H) 03/23/2018   CHOLHDL 6 03/23/2018           Hypertension: Currently not on any treatment He is on Jardiance with the following results   BP Readings from Last 3 Encounters:  12/14/18 122/84  09/08/18 138/76  07/28/18 (!) 142/86    Most recent eye exam was 08/2017  Most recent foot exam: 11/2017    LABS:  Office Visit on 12/14/2018  Component Date Value Ref Range Status  . Hemoglobin A1C 12/14/2018 7.4* 4.0 - 5.6 % Final  . POC Glucose 12/14/2018 134* 70 - 99 mg/dl Final    Physical Examination:  BP 122/84 (BP Location: Left Arm, Patient Position: Sitting, Cuff Size: Large)   Pulse (!) 119   Ht 6' (1.829 m)   Wt 274 lb 6.4 oz (124.5 kg)   SpO2 95%   BMI 37.22 kg/m       ASSESSMENT:  Diabetes type 2, with obesity  See history of present illness for detailed discussion of current diabetes management, blood sugar patterns and problems identified  His A1c is much better than usual at 7.4  He has also lost about 18 pounds  With what appears to be a better diet and restarting Jardiance his blood sugars are significantly better than before However he is not checking his blood sugars after meals and not clear how often he needs to take Humalog or how much Today postprandial readings after a low-fat meal in the office is not high Most likely needs mealtime insulin at least when he is eating carbohydrates  HYPERTENSION: This is relatively well controlled although inconsistent, generally better than last year  PLAN:    New prescription for glucose monitor will  be given to him  He will need to check his blood sugars after meals at least once a day  Discussed that he has to adjust his Humalog based on what his blood sugars are trending after meals  He can continue 20 units of Humalog for dinner for now but take it every day  If he is eating a sandwich or high carbohydrate meal at lunch he will also take the same dose He may be able to reduce his [name] his morning sugars are starting to be below 90  Patient Instructions  Check blood sugars on waking up 3  days a week  Also check blood sugars about 2 hours after meals and do this after different meals by rotation  Recommended blood sugar levels on waking up are 90-130 and about 2 hours after meal is 130-160  Please bring your blood sugar monitor to each visit, thank you  Must check sugar after supper  Humalog for starchy meals     Elayne Snare 12/14/2018, 8:54 PM   Note: This office note was prepared with Dragon voice recognition system technology. Any transcriptional errors that result from this process are unintentional.

## 2018-12-14 NOTE — Patient Instructions (Addendum)
Check blood sugars on waking up 3  days a week  Also check blood sugars about 2 hours after meals and do this after different meals by rotation  Recommended blood sugar levels on waking up are 90-130 and about 2 hours after meal is 130-160  Please bring your blood sugar monitor to each visit, thank you  Must check sugar after supper  Humalog for starchy meals

## 2018-12-15 LAB — BASIC METABOLIC PANEL
BUN: 14 mg/dL (ref 6–23)
CO2: 27 mEq/L (ref 19–32)
Calcium: 9.7 mg/dL (ref 8.4–10.5)
Chloride: 102 mEq/L (ref 96–112)
Creatinine, Ser: 1.26 mg/dL (ref 0.40–1.50)
GFR: 77.11 mL/min (ref >60.00)
Glucose, Bld: 111 mg/dL — ABNORMAL HIGH (ref 70–99)
Potassium: 4.1 mEq/L (ref 3.5–5.1)
Sodium: 139 mEq/L (ref 135–145)

## 2018-12-15 LAB — LIPID PANEL
Cholesterol: 211 mg/dL — ABNORMAL HIGH (ref 0–200)
HDL: 33.4 mg/dL — ABNORMAL LOW (ref >39.00)
NonHDL: 177.76
Total CHOL/HDL Ratio: 6
Triglycerides: 330 mg/dL — ABNORMAL HIGH (ref 0.0–149.0)
VLDL: 66 mg/dL — ABNORMAL HIGH (ref 0.0–40.0)

## 2018-12-15 LAB — LDL CHOLESTEROL, DIRECT: Direct LDL: 114 mg/dL

## 2019-01-09 ENCOUNTER — Other Ambulatory Visit: Payer: Self-pay | Admitting: Endocrinology

## 2019-01-09 MED FILL — TRULICITY 1.5 MG/0.5 ML PEN: 1.5 | 28 days supply | Qty: 2 | Fill #0

## 2019-01-09 MED FILL — JARDIANCE 25 MG TABLET: 25 | 30 days supply | Qty: 30 | Fill #0

## 2019-01-09 MED FILL — TRESIBA FLEXTOUCH 200 UNITS: 200 | 28 days supply | Qty: 12 | Fill #0

## 2019-02-03 MED FILL — TRESIBA FLEXTOUCH 200 UNITS: 200 | 28 days supply | Qty: 12 | Fill #0

## 2019-02-03 MED FILL — TRULICITY 1.5 MG/0.5 ML PEN: 1.5 | 28 days supply | Qty: 2 | Fill #0

## 2019-02-10 ENCOUNTER — Other Ambulatory Visit: Payer: Self-pay | Admitting: Endocrinology

## 2019-02-13 MED FILL — JARDIANCE 25 MG TABLET: 25 | 30 days supply | Qty: 30 | Fill #0

## 2019-03-03 ENCOUNTER — Other Ambulatory Visit: Payer: Self-pay | Admitting: Endocrinology

## 2019-03-06 MED FILL — TRULICITY 1.5 MG/0.5 ML PEN: 1.5 | 28 days supply | Qty: 2 | Fill #0

## 2019-03-13 ENCOUNTER — Other Ambulatory Visit: Payer: Self-pay | Admitting: Endocrinology

## 2019-03-13 MED FILL — TRESIBA FLEXTOUCH 200 UNITS: 200 | 28 days supply | Qty: 12 | Fill #0

## 2019-03-13 MED FILL — JARDIANCE 25 MG TABLET: 25 | 30 days supply | Qty: 30 | Fill #0

## 2019-03-21 ENCOUNTER — Other Ambulatory Visit: Payer: Self-pay

## 2019-03-21 ENCOUNTER — Other Ambulatory Visit (INDEPENDENT_AMBULATORY_CARE_PROVIDER_SITE_OTHER): Payer: 59

## 2019-03-21 DIAGNOSIS — E1165 Type 2 diabetes mellitus with hyperglycemia: Secondary | ICD-10-CM | POA: Diagnosis not present

## 2019-03-21 DIAGNOSIS — Z794 Long term (current) use of insulin: Secondary | ICD-10-CM

## 2019-03-21 LAB — MICROALBUMIN / CREATININE URINE RATIO
Creatinine,U: 85.6 mg/dL
Microalb Creat Ratio: 2.6 mg/g (ref 0.0–30.0)
Microalb, Ur: 2.3 mg/dL — ABNORMAL HIGH (ref 0.0–1.9)

## 2019-03-21 LAB — COMPREHENSIVE METABOLIC PANEL
ALT: 22 U/L (ref 0–53)
AST: 17 U/L (ref 0–37)
Albumin: 4.6 g/dL (ref 3.5–5.2)
Alkaline Phosphatase: 77 U/L (ref 39–117)
BUN: 12 mg/dL (ref 6–23)
CO2: 28 mEq/L (ref 19–32)
Calcium: 10 mg/dL (ref 8.4–10.5)
Chloride: 103 mEq/L (ref 96–112)
Creatinine, Ser: 1.07 mg/dL (ref 0.40–1.50)
GFR: 92.99 mL/min (ref >60.00)
Glucose, Bld: 130 mg/dL — ABNORMAL HIGH (ref 70–99)
Potassium: 4.3 mEq/L (ref 3.5–5.1)
Sodium: 138 mEq/L (ref 135–145)
Total Bilirubin: 0.3 mg/dL (ref 0.2–1.2)
Total Protein: 8.4 g/dL — ABNORMAL HIGH (ref 6.0–8.3)

## 2019-03-21 LAB — HEMOGLOBIN A1C: Hgb A1c MFr Bld: 7.6 % — ABNORMAL HIGH (ref 4.6–6.5)

## 2019-03-23 ENCOUNTER — Other Ambulatory Visit: Payer: Self-pay

## 2019-03-23 ENCOUNTER — Encounter: Payer: 59 | Admitting: Endocrinology

## 2019-03-23 NOTE — Progress Notes (Signed)
This encounter was created in error - please disregard.

## 2019-04-03 ENCOUNTER — Telehealth: Payer: Self-pay | Admitting: Endocrinology

## 2019-04-03 ENCOUNTER — Other Ambulatory Visit: Payer: Self-pay | Admitting: Endocrinology

## 2019-04-03 MED FILL — TRULICITY 1.5 MG/0.5 ML PEN: 1.5 | 28 days supply | Qty: 2 | Fill #0

## 2019-04-03 NOTE — Telephone Encounter (Signed)
Completed for a DOXY tomorrow so we do not have to repeat labs.

## 2019-04-03 NOTE — Telephone Encounter (Signed)
-----   Message from Elayne Snare, MD sent at 04/03/2019  1:28 PM EDT ----- Regarding: Missed appointment He missed his appointment after his labs in August.  Please reschedule ASAP.  Do not want to wait till November to see him

## 2019-04-04 ENCOUNTER — Ambulatory Visit: Payer: 59 | Admitting: Endocrinology

## 2019-04-04 MED FILL — TRESIBA FLEXTOUCH 200 UNITS: 200 | 28 days supply | Qty: 12 | Fill #0

## 2019-04-05 ENCOUNTER — Ambulatory Visit (INDEPENDENT_AMBULATORY_CARE_PROVIDER_SITE_OTHER): Payer: 59 | Admitting: Endocrinology

## 2019-04-05 ENCOUNTER — Encounter: Payer: Self-pay | Admitting: Endocrinology

## 2019-04-05 ENCOUNTER — Other Ambulatory Visit: Payer: Self-pay

## 2019-04-05 DIAGNOSIS — E1165 Type 2 diabetes mellitus with hyperglycemia: Secondary | ICD-10-CM

## 2019-04-05 DIAGNOSIS — Z794 Long term (current) use of insulin: Secondary | ICD-10-CM

## 2019-04-05 DIAGNOSIS — E782 Mixed hyperlipidemia: Secondary | ICD-10-CM | POA: Diagnosis not present

## 2019-04-05 DIAGNOSIS — E119 Type 2 diabetes mellitus without complications: Secondary | ICD-10-CM | POA: Diagnosis not present

## 2019-04-05 DIAGNOSIS — Z13220 Encounter for screening for lipoid disorders: Secondary | ICD-10-CM | POA: Diagnosis not present

## 2019-04-05 DIAGNOSIS — Z23 Encounter for immunization: Secondary | ICD-10-CM | POA: Diagnosis not present

## 2019-04-05 NOTE — Progress Notes (Signed)
Patient ID: Roy Jennings, male   DOB: 07-12-1981, 38 y.o.   MRN: [id number]            Reason for Appointment: Follow-up for Type 2 Diabetes  Today's office visit was provided via telemedicine using video technique The patient was explained the limitations of evaluation and management by telemedicine and the availability of in person appointments.  The patient understood the limitations and agreed to proceed. Patient also understood that the telehealth visit is billable. . Location of the patient: Patient's home . Location of the provider: Physician office Only the patient and myself were participating in the encounter    History of Present Illness:          Date of diagnosis of type 2 diabetes mellitus: 12/2011       Background history:   He was initially diagnosed with marked hyperglycemia and A1c of 13.4% He was apparently started on insulin at onset and has taken various insulin regimens in the past He has had difficulty tolerating metformin and has not taken this most of the time He has probably been on Lantus since about 08/2015, previously on the 70/30 insulin His level of control had improved initially in 2014 and 2015  Recent history:   INSULIN regimen is: 86 units Tresiba in am, Humalog none recently  Non-insulin hypoglycemic drugs the patient is taking are:  Jardiance 25 mg taking, Trulicity 1.5 mg weekly  His A1c is 7.6 compared to 7.4, previously had been 10% %   Current management, blood sugar patterns and problems identified:  He was told to take his Humalog consistently at all meals that have carbohydrate especially supper and some at lunch also if needed  However he completely forgets to do this and is not taking it at all  Also he was told to start checking his sugars and also monitor readings after meals consistently  However he has only 5 fasting readings in the last month and no other blood sugars  FASTING blood sugar fluctuates and as high as 172.   Unclear what his blood sugars are after meals  He thinks his weight is about the same or few pounds higher  Although he is trying to do some walking he is not always consistent with his diet especially with higher fat foods or eating out  He does state he is taking his Trulicity and Jardiance regularly        Dinner usually at 7-8 pm   Side effects from medications have been: Diarrhea from most doses of metformin  Compliance with the medical regimen: Inconsistent  Hypoglycemia:   none  Glucose monitoring:  done 0-1 times a day         Glucometer: ?  Accu-Chek   Blood Glucose readings range from 127 up to 172 LAB fasting glucose 130   Self-care: The diet that the patient has been following is: None   Typical meal intake: Variable        Dietician visit, most recent: 08/2015, diabetes educator in 7/19               Exercise: walking up to 60 min in the mornings   Weight history:   Wt Readings from Last 3 Encounters:  12/14/18 274 lb 6.4 oz (124.5 kg)  09/08/18 291 lb 9.6 oz (132.3 kg)  07/28/18 289 lb 3.2 oz (131.2 kg)    Glycemic control:   Lab Results  Component Value Date   HGBA1C 7.6 (H) 03/21/2019  HGBA1C 7.4 (A) 12/14/2018   HGBA1C 10.0 (H) 08/31/2018   Lab Results  Component Value Date   MICROALBUR 2.3 (H) 03/21/2019   LDLCALC 83 04/28/2017   CREATININE 1.07 03/21/2019   Lab Results  Component Value Date   MICRALBCREAT 2.6 03/21/2019    Lab Results  Component Value Date   FRUCTOSAMINE 283 08/31/2018   FRUCTOSAMINE 292 (H) 03/23/2018   FRUCTOSAMINE 275 (H) 11/05/2017      Allergies as of 04/05/2019      Reactions   Metformin And Related    Diarrhea, nausea      Medication List       Accurate as of April 05, 2019  8:40 AM. If you have any questions, ask your nurse or doctor.        STOP taking these medications   aspirin EC 81 MG tablet Stopped by: Elayne Snare, MD   insulin lispro 100 UNIT/ML KwikPen Commonly known as: HumaLOG  KwikPen Stopped by: Elayne Snare, MD   pravastatin 40 MG tablet Commonly known as: PRAVACHOL Stopped by: Elayne Snare, MD     TAKE these medications   FreeStyle Freedom Lite w/Device Kit Use as instructed to check blood sugar twice daily.   freestyle lancets 1 each by Other route 2 (two) times daily. for testing   glucose blood test strip Commonly known as: FREESTYLE LITE Use as instructed to check blood sugar twice daily.   Insulin Pen Needle 31G X 8 MM Misc Commonly known as: Unifine Pentips USE TO INJECT INSULIN 1-3 TIMES INTO SKIN DAILY   Jardiance 25 MG Tabs tablet Generic drug: empagliflozin TAKE 1 TABLET BY MOUTH ONCE DAILY   Tresiba FlexTouch 200 UNIT/ML Sopn Generic drug: Insulin Degludec INJECT 86 UNITS INTO THE SKIN DAILY.   Trulicity 1.5 IP/3.8SN Sopn Generic drug: Dulaglutide INJECT 1 PEN INTO THE SKIN EACH WEEK AS DIRECTED       Allergies:  Allergies  Allergen Reactions  . Metformin And Related     Diarrhea, nausea    Past Medical History:  Diagnosis Date  . Diabetes mellitus 12/30/11  . DKA (diabetic ketoacidoses) ([place]) 12/30/11   new dx DM a1c 13  . PVC (premature ventricular contraction)    LBB inferior axis QRS // followed by [name]  . Right bundle branch block     Past Surgical History:  Procedure Laterality Date  . KNEE ARTHROSCOPY  1999-2000   bilateral    Family History  Problem Relation Age of Onset  . Diabetes Father   . Diabetes Maternal Grandmother   . Hypertension Mother   . CAD Neg Hx     Social History:  reports that he has never smoked. He has never used smokeless tobacco. He reports current alcohol use. He reports that he does not use drugs.   Review of Systems   Lipid history: His lipids are not optimally controlled, not on any medications currently, previously had been on Lipitor and pravastatin  Has high triglycerides, however LDL is above 100   Lab Results  Component Value Date   CHOL 211 (H)  12/14/2018   HDL 33.40 (L) 12/14/2018   LDLCALC 83 04/28/2017   LDLDIRECT 114.0 12/14/2018   TRIG 330.0 (H) 12/14/2018   CHOLHDL 6 12/14/2018           Hypertension: He is not on any treatment He is on Jardiance only Today at his PCP office blood pressure was 124/80 reportedly   BP Readings from Last 3 Encounters:  12/14/18 122/84  09/08/18 138/76  07/28/18 (!) 142/86    Most recent eye exam was 08/2017  Most recent foot exam: 11/2017    LABS:  No visits with results within 1 Week(s) from this visit.  Latest known visit with results is:  Lab on 03/21/2019  Component Date Value Ref Range Status  . Microalb, Ur 03/21/2019 2.3* 0.0 - 1.9 mg/dL Final  . Creatinine,U 03/21/2019 85.6  mg/dL Final  . Microalb Creat Ratio 03/21/2019 2.6  0.0 - 30.0 mg/g Final  . Sodium 03/21/2019 138  135 - 145 mEq/L Final  . Potassium 03/21/2019 4.3  3.5 - 5.1 mEq/L Final  . Chloride 03/21/2019 103  96 - 112 mEq/L Final  . CO2 03/21/2019 28  19 - 32 mEq/L Final  . Glucose, Bld 03/21/2019 130* 70 - 99 mg/dL Final  . BUN 03/21/2019 12  6 - 23 mg/dL Final  . Creatinine, Ser 03/21/2019 1.07  0.40 - 1.50 mg/dL Final  . Total Bilirubin 03/21/2019 0.3  0.2 - 1.2 mg/dL Final  . Alkaline Phosphatase 03/21/2019 77  39 - 117 U/L Final  . AST 03/21/2019 17  0 - 37 U/L Final  . ALT 03/21/2019 22  0 - 53 U/L Final  . Total Protein 03/21/2019 8.4* 6.0 - 8.3 g/dL Final  . Albumin 03/21/2019 4.6  3.5 - 5.2 g/dL Final  . Calcium 03/21/2019 10.0  8.4 - 10.5 mg/dL Final  . GFR 03/21/2019 92.99  >60.00 mL/min Final  . Hgb A1c MFr Bld 03/21/2019 7.6* 4.6 - 6.5 % Final   Glycemic Control Guidelines for People with Diabetes:Non Diabetic:  <6%Goal of Therapy: <7%Additional Action Suggested:  >8%     Physical Examination:  There were no vitals taken for this visit.      ASSESSMENT:  Diabetes type 2, with obesity  See history of present illness for detailed discussion of current diabetes management, blood  sugar patterns and problems identified  His A1c is still reasonably good at 7.6 although can be better  Although previously had done well with weight loss and lifestyle changes especially exercise and diet he has been regular with his treatment regimen He is not taking mealtime insulin and likely has some high postprandial readings which he does not monitor Fasting readings are as high as 172 but not consistently high Weight appears to be going back up and  HYPERTENSION: This is recently well controlled  Lipids: Will be checked by PCP Most likely he needs to go back to pravastatin which he had previously taken   PLAN:    Continue 86 units of Tresiba  Discussed checking blood sugars at least every other day after meals  He will also take Humalog with at least supper and depending on the meal also at lunch  He will start with 10 units and if blood sugars are more than 180 go up to 15 or 20  Cut back on high fat foods and high carbohydrate meals Bring blood sugar monitor for download on the next visit  There are no Patient Instructions on file for this visit.  Elayne Snare 04/05/2019, 8:40 AM   Note: This office note was prepared with Dragon voice recognition system technology. Any transcriptional errors that result from this process are unintentional.

## 2019-04-06 MED FILL — JARDIANCE 25 MG TABLET: 25 | 30 days supply | Qty: 30 | Fill #0

## 2019-04-24 MED FILL — JARDIANCE 25 MG TABLET: 25 | 30 days supply | Qty: 30 | Fill #0

## 2019-05-03 ENCOUNTER — Other Ambulatory Visit: Payer: Self-pay | Admitting: Endocrinology

## 2019-05-04 MED FILL — TRULICITY 1.5 MG/0.5 ML PEN: 1.5 | 28 days supply | Qty: 2 | Fill #0

## 2019-05-11 ENCOUNTER — Other Ambulatory Visit: Payer: Self-pay | Admitting: Endocrinology

## 2019-05-11 MED FILL — TRESIBA FLEXTOUCH 200 UNITS: 200 | 28 days supply | Qty: 12 | Fill #0

## 2019-05-23 ENCOUNTER — Other Ambulatory Visit: Payer: Self-pay | Admitting: Endocrinology

## 2019-05-23 MED FILL — JARDIANCE 25 MG TABLET: 25 | 30 days supply | Qty: 30 | Fill #0

## 2019-05-24 DIAGNOSIS — Z03818 Encounter for observation for suspected exposure to other biological agents ruled out: Secondary | ICD-10-CM | POA: Diagnosis not present

## 2019-05-26 MED FILL — TRULICITY 1.5 MG/0.5 ML PEN: 1.5 | 28 days supply | Qty: 2 | Fill #1

## 2019-06-12 ENCOUNTER — Other Ambulatory Visit: Payer: 59

## 2019-06-12 ENCOUNTER — Other Ambulatory Visit: Payer: Self-pay | Admitting: Endocrinology

## 2019-06-12 MED FILL — TRESIBA FLEXTOUCH 200 UNITS: 200 | 28 days supply | Qty: 12 | Fill #0

## 2019-06-15 ENCOUNTER — Ambulatory Visit: Payer: 59 | Admitting: Endocrinology

## 2019-06-23 MED FILL — TRULICITY 1.5 MG/0.5 ML PEN: 1.5 | 28 days supply | Qty: 2 | Fill #2

## 2019-06-23 MED FILL — JARDIANCE 25 MG TABLET: 25 | 30 days supply | Qty: 30 | Fill #1

## 2019-06-28 ENCOUNTER — Other Ambulatory Visit (INDEPENDENT_AMBULATORY_CARE_PROVIDER_SITE_OTHER): Payer: 59

## 2019-06-28 ENCOUNTER — Other Ambulatory Visit: Payer: Self-pay

## 2019-06-28 DIAGNOSIS — E1165 Type 2 diabetes mellitus with hyperglycemia: Secondary | ICD-10-CM | POA: Diagnosis not present

## 2019-06-28 DIAGNOSIS — E782 Mixed hyperlipidemia: Secondary | ICD-10-CM | POA: Diagnosis not present

## 2019-06-28 DIAGNOSIS — Z794 Long term (current) use of insulin: Secondary | ICD-10-CM | POA: Diagnosis not present

## 2019-06-28 LAB — COMPREHENSIVE METABOLIC PANEL
ALT: 24 U/L (ref 0–53)
AST: 19 U/L (ref 0–37)
Albumin: 4.2 g/dL (ref 3.5–5.2)
Alkaline Phosphatase: 83 U/L (ref 39–117)
BUN: 9 mg/dL (ref 6–23)
CO2: 29 mEq/L (ref 19–32)
Calcium: 9.6 mg/dL (ref 8.4–10.5)
Chloride: 104 mEq/L (ref 96–112)
Creatinine, Ser: 1.1 mg/dL (ref 0.40–1.50)
GFR: 89.94 mL/min (ref >60.00)
Glucose, Bld: 110 mg/dL — ABNORMAL HIGH (ref 70–99)
Potassium: 4.2 mEq/L (ref 3.5–5.1)
Sodium: 140 mEq/L (ref 135–145)
Total Bilirubin: 0.4 mg/dL (ref 0.2–1.2)
Total Protein: 8 g/dL (ref 6.0–8.3)

## 2019-06-28 LAB — LDL CHOLESTEROL, DIRECT: Direct LDL: 78 mg/dL

## 2019-06-28 LAB — HEMOGLOBIN A1C: Hgb A1c MFr Bld: 7.1 % — ABNORMAL HIGH (ref 4.6–6.5)

## 2019-07-06 ENCOUNTER — Ambulatory Visit: Payer: 59 | Admitting: Endocrinology

## 2019-07-06 ENCOUNTER — Other Ambulatory Visit: Payer: Self-pay

## 2019-07-06 ENCOUNTER — Ambulatory Visit (INDEPENDENT_AMBULATORY_CARE_PROVIDER_SITE_OTHER): Payer: 59 | Admitting: Endocrinology

## 2019-07-06 ENCOUNTER — Encounter: Payer: Self-pay | Admitting: Endocrinology

## 2019-07-06 DIAGNOSIS — Z794 Long term (current) use of insulin: Secondary | ICD-10-CM | POA: Diagnosis not present

## 2019-07-06 DIAGNOSIS — E782 Mixed hyperlipidemia: Secondary | ICD-10-CM

## 2019-07-06 DIAGNOSIS — E1165 Type 2 diabetes mellitus with hyperglycemia: Secondary | ICD-10-CM | POA: Diagnosis not present

## 2019-07-06 MED ORDER — TRULICITY 3 MG/0.5ML ~~LOC~~ SOAJ: 3.0000 mg | SUBCUTANEOUS | 2 refills | Status: DC

## 2019-07-06 MED FILL — TRULICITY 3 MG/0.5ML SOPN: 3 | 28 days supply | Qty: 2 | Fill #0

## 2019-07-06 NOTE — Progress Notes (Signed)
Patient ID: Roy Jennings, male   DOB: 08-Aug-1980, 38 y.o.   MRN: [id number]            Reason for Appointment: Follow-up for Type 2 Diabetes  I connected with the above-named patient by video enabled telemedicine application and verified that I am speaking with the correct person. The patient was explained the limitations of evaluation and management by telemedicine and the availability of in person appointments.  Patient also understood that there may be a patient responsible charge related to this service . Location of the patient: Patient's home . Location of the provider: Physician office Only the patient and myself were participating in the encounter The patient understood the above statements and agreed to proceed.   History of Present Illness:          Date of diagnosis of type 2 diabetes mellitus: 12/2011       Background history:   He was initially diagnosed with marked hyperglycemia and A1c of 13.4% He was apparently started on insulin at onset and has taken various insulin regimens in the past He has had difficulty tolerating metformin and has not taken this most of the time He has probably been on Lantus since about 08/2015, previously on the 70/30 insulin His level of control had improved initially in 2014 and 2015  Recent history:   INSULIN regimen is: 86 units Tresiba in am, Humalog none   Non-insulin hypoglycemic drugs the patient is taking are:  Jardiance 25 mg taking, Trulicity 1.5 mg weekly  His A1c is 7.1 and continues to improve  Highest level previously had been 10% %   Current management, blood sugar patterns and problems identified:  He has had improved blood sugar control overall even though he has not lost any weight  He has continued walking as before and walks up to 3 miles almost every day  He is likely doing better with his diet since his glucose readings are more consistently near normal in the morning  However he forgets to check his sugars  after meals as directed  Also lab glucose was fairly good at 110 fasting  Usually trying to eat controlled portions and avoiding snacks but may tend to eat more sometimes in the evenings He thinks his weight recently is about [place] usually at 7-8 pm   Side effects from medications have been: Diarrhea from most doses of metformin  Compliance with the medical regimen: Inconsistent  Hypoglycemia:   none  Glucose monitoring:  done 0-1 times a day         Glucometer: ?  Accu-Chek   Blood Glucose readings from patient meter review Fasting range 110-135 in the last 2 weeks or so   Self-care: The diet that the patient has been following is: None   Typical meal intake: Variable        Dietician visit, most recent: 08/2015, diabetes educator in 08/2018               Exercise: walking up to 60 min in the mornings   Weight history:   Wt Readings from Last 3 Encounters:  12/14/18 274 lb 6.4 oz (124.5 kg)  09/08/18 291 lb 9.6 oz (132.3 kg)  07/28/18 289 lb 3.2 oz (131.2 kg)    Glycemic control:   Lab Results  Component Value Date   HGBA1C 7.1 (H) 06/28/2019   HGBA1C 7.6 (H) 03/21/2019   HGBA1C 7.4 (A) 12/14/2018   Lab Results  Component Value Date   MICROALBUR 2.3 (H) 03/21/2019   LDLCALC 83 04/28/2017   CREATININE 1.10 06/28/2019   Lab Results  Component Value Date   MICRALBCREAT 2.6 03/21/2019    Lab Results  Component Value Date   FRUCTOSAMINE 283 08/31/2018   FRUCTOSAMINE 292 (H) 03/23/2018   FRUCTOSAMINE 275 (H) 11/05/2017      Allergies as of 07/06/2019      Reactions   Metformin And Related    Diarrhea, nausea      Medication List       Accurate as of July 06, 2019  4:01 PM. If you have any questions, ask your nurse or doctor.        FreeStyle Freedom Lite w/Device Kit Use as instructed to check blood sugar twice daily.   freestyle lancets 1 each by Other route 2 (two) times daily. for testing   glucose blood test strip Commonly  known as: FREESTYLE LITE Use as instructed to check blood sugar twice daily.   Insulin Pen Needle 31G X 8 MM Misc Commonly known as: Unifine Pentips USE TO INJECT INSULIN 1-3 TIMES INTO SKIN DAILY   Jardiance 25 MG Tabs tablet Generic drug: empagliflozin TAKE 1 TABLET BY MOUTH ONCE DAILY   Tresiba FlexTouch 200 UNIT/ML Sopn Generic drug: Insulin Degludec INJECT 86 UNITS INTO THE SKIN DAILY.   Trulicity 1.5 PR/9.1MB Sopn Generic drug: Dulaglutide INJECT 1 PEN INTO THE SKIN EACH WEEK AS DIRECTED       Allergies:  Allergies  Allergen Reactions  . Metformin And Related     Diarrhea, nausea    Past Medical History:  Diagnosis Date  . Diabetes mellitus 12/30/11  . DKA (diabetic ketoacidoses) ([place]) 12/30/11   new dx DM a1c 13  . PVC (premature ventricular contraction)    LBB inferior axis QRS // followed by [name]  . Right bundle branch block     Past Surgical History:  Procedure Laterality Date  . KNEE ARTHROSCOPY  1999-2000   bilateral    Family History  Problem Relation Age of Onset  . Diabetes Father   . Diabetes Maternal Grandmother   . Hypertension Mother   . CAD Neg Hx     Social History:  reports that he has never smoked. He has never used smokeless tobacco. He reports current alcohol use. He reports that he does not use drugs.   Review of Systems   Lipid history: His lipids are not optimally controlled, not on any medications currently, previously had been on Lipitor and pravastatin  Has history of high triglycerides, however LDL is 78 now    Lab Results  Component Value Date   CHOL 211 (H) 12/14/2018   HDL 33.40 (L) 12/14/2018   LDLCALC 83 04/28/2017   LDLDIRECT 78.0 06/28/2019   TRIG 330.0 (H) 12/14/2018   CHOLHDL 6 12/14/2018           Hypertension: He is not on any treatment He is on Jardiance 25 mg  Has not checked blood pressure at home   BP Readings from Last 3 Encounters:  12/14/18 122/84  09/08/18 138/76  07/28/18  (!) 142/86    Most recent eye exam was 08/2017  Most recent foot exam: 11/2017    LABS:  No visits with results within 1 Week(s) from this visit.  Latest known visit with results is:  Lab on 06/28/2019  Component Date Value Ref Range Status  . Direct LDL 06/28/2019 78.0  mg/dL Final   Optimal:  <  100 mg/dLNear or Above Optimal:  100-129 mg/dLBorderline High:  130-159 mg/dLHigh:  160-189 mg/dLVery High:  >190 mg/dL  . Sodium 06/28/2019 140  135 - 145 mEq/L Final  . Potassium 06/28/2019 4.2  3.5 - 5.1 mEq/L Final  . Chloride 06/28/2019 104  96 - 112 mEq/L Final  . CO2 06/28/2019 29  19 - 32 mEq/L Final  . Glucose, Bld 06/28/2019 110* 70 - 99 mg/dL Final  . BUN 06/28/2019 9  6 - 23 mg/dL Final  . Creatinine, Ser 06/28/2019 1.10  0.40 - 1.50 mg/dL Final  . Total Bilirubin 06/28/2019 0.4  0.2 - 1.2 mg/dL Final  . Alkaline Phosphatase 06/28/2019 83  39 - 117 U/L Final  . AST 06/28/2019 19  0 - 37 U/L Final  . ALT 06/28/2019 24  0 - 53 U/L Final  . Total Protein 06/28/2019 8.0  6.0 - 8.3 g/dL Final  . Albumin 06/28/2019 4.2  3.5 - 5.2 g/dL Final  . GFR 06/28/2019 89.94  >60.00 mL/min Final  . Calcium 06/28/2019 9.6  8.4 - 10.5 mg/dL Final  . Hgb A1c MFr Bld 06/28/2019 7.1* 4.6 - 6.5 % Final   Glycemic Control Guidelines for People with Diabetes:Non Diabetic:  <6%Goal of Therapy: <7%Additional Action Suggested:  >8%     Physical Examination:  There were no vitals taken for this visit.      ASSESSMENT:  Diabetes type 2, with obesity  See history of present illness for detailed discussion of current diabetes management, blood sugar patterns and problems identified  His A1c is improved at 7.1  His weight has fluctuated this year although recently appears to be somewhat better Fasting blood sugars are better compared to his last visit He has not checked readings after meals Likely can benefit from a higher dose of Trulicity with better weight loss and improved satiety as well as  potentially reducing higher postprandial readings after dinner  Lipids: Well-controlled with LDL below 100 Not on statin drug   PLAN:    Stay on 86 units of Tresiba but discussed that if his blood sugar is below 90 he needs to start cutting back 4 units weekly  Increase Trulicity to 3 mg, discussed that he may have mild nausea initially with increasing the dose  Discussed need to check sugars after dinner so he can assess his diet as well as need for any Humalog which he is not taking  Continue working on reducing total calorie intake Follow-up in 3 months  There are no Patient Instructions on file for this visit.  Elayne Snare 07/06/2019, 4:01 PM   Note: This office note was prepared with Dragon voice recognition system technology. Any transcriptional errors that result from this process are unintentional.

## 2019-07-11 ENCOUNTER — Other Ambulatory Visit: Payer: Self-pay | Admitting: Endocrinology

## 2019-07-11 MED FILL — TRESIBA FLEXTOUCH 200 UNITS: 200 | 28 days supply | Qty: 12 | Fill #0

## 2019-07-27 MED FILL — JARDIANCE 25 MG TABLET: 25 | 30 days supply | Qty: 30 | Fill #2

## 2019-08-03 MED FILL — TRESIBA FLEXTOUCH 200 UNITS: 200 | 28 days supply | Qty: 12 | Fill #1

## 2019-08-03 MED FILL — TRULICITY 3 MG/0.5ML SOPN: 3 | 28 days supply | Qty: 2 | Fill #1

## 2019-08-23 DIAGNOSIS — Z0100 Encounter for examination of eyes and vision without abnormal findings: Secondary | ICD-10-CM | POA: Diagnosis not present

## 2019-08-25 DIAGNOSIS — Z20828 Contact with and (suspected) exposure to other viral communicable diseases: Secondary | ICD-10-CM | POA: Diagnosis not present

## 2019-08-28 ENCOUNTER — Other Ambulatory Visit: Payer: Self-pay | Admitting: Internal Medicine

## 2019-08-28 DIAGNOSIS — U071 COVID-19: Secondary | ICD-10-CM

## 2019-08-28 DIAGNOSIS — E66812 Obesity, class 2: Secondary | ICD-10-CM

## 2019-08-28 DIAGNOSIS — E669 Obesity, unspecified: Secondary | ICD-10-CM | POA: Diagnosis not present

## 2019-08-28 DIAGNOSIS — E119 Type 2 diabetes mellitus without complications: Secondary | ICD-10-CM | POA: Diagnosis not present

## 2019-08-28 DIAGNOSIS — Z6837 Body mass index (BMI) 37.0-37.9, adult: Secondary | ICD-10-CM

## 2019-08-28 NOTE — Progress Notes (Unsigned)
  I connected by phone with Brien Few on 08/28/2019 at 10:23 AM to discuss the potential use of an new treatment for mild to moderate COVID-19 viral infection in non-hospitalized patients.  This patient is a 39 y.o. male that meets the FDA criteria for Emergency Use Authorization of bamlanivimab or casirivimab\imdevimab.  Has a (+) direct SARS-CoV-2 viral test result  Has mild or moderate COVID-19   Is ? 40 years of age and weighs ? 40 kg  Is NOT hospitalized due to COVID-19  Is NOT requiring oxygen therapy or requiring an increase in baseline oxygen flow rate due to COVID-19  Is within 10 days of symptom onset  Has at least one of the high risk factor(s) for progression to severe COVID-19 and/or hospitalization as defined in EUA.  Specific high risk criteria : Diabetes, BMI >35   I have spoken and communicated the following to the patient or parent/caregiver:  1. FDA has authorized the emergency use of bamlanivimab and casirivimab\imdevimab for the treatment of mild to moderate COVID-19 in adults and pediatric patients with positive results of direct SARS-CoV-2 viral testing who are 80 years of age and older weighing at least 40 kg, and who are at high risk for progressing to severe COVID-19 and/or hospitalization.  2. The significant known and potential risks and benefits of bamlanivimab and casirivimab\imdevimab, and the extent to which such potential risks and benefits are unknown.  3. Information on available alternative treatments and the risks and benefits of those alternatives, including clinical trials.  4. Patients treated with bamlanivimab and casirivimab\imdevimab should continue to self-isolate and use infection control measures (e.g., wear mask, isolate, social distance, avoid sharing personal items, clean and disinfect "high touch" surfaces, and frequent handwashing) according to CDC guidelines.   5. The patient or parent/caregiver has the option to accept or refuse  bamlanivimab or casirivimab\imdevimab .  After reviewing this information with the patient, The patient agreed to proceed with receiving the bamlanimivab infusion and will be provided a copy of the Fact sheet prior to receiving the infusion.Cyndee Brightly, NP-C [facility]

## 2019-08-31 ENCOUNTER — Ambulatory Visit (HOSPITAL_COMMUNITY)
Admission: RE | Admit: 2019-08-31 | Discharge: 2019-08-31 | Disposition: A | Discharge status: Home / Self Care | Payer: 59 | Source: Ambulatory Visit | Attending: Pulmonary Disease | Admitting: Pulmonary Disease

## 2019-08-31 DIAGNOSIS — E119 Type 2 diabetes mellitus without complications: Secondary | ICD-10-CM | POA: Insufficient documentation

## 2019-08-31 DIAGNOSIS — U071 COVID-19: Secondary | ICD-10-CM | POA: Diagnosis not present

## 2019-08-31 DIAGNOSIS — E669 Obesity, unspecified: Secondary | ICD-10-CM | POA: Insufficient documentation

## 2019-08-31 DIAGNOSIS — Z23 Encounter for immunization: Secondary | ICD-10-CM | POA: Diagnosis not present

## 2019-08-31 DIAGNOSIS — Z6837 Body mass index (BMI) 37.0-37.9, adult: Secondary | ICD-10-CM | POA: Diagnosis not present

## 2019-08-31 MED ORDER — METHYLPREDNISOLONE SODIUM SUCC 125 MG IJ SOLR: 125.0000 mg | Freq: Once | INTRAMUSCULAR | Status: DC | PRN

## 2019-08-31 MED ORDER — SODIUM CHLORIDE 0.9 % IV SOLN
700.0000 mg | Freq: Once | INTRAVENOUS | Status: AC
  Administered 2019-08-31: 700 mg via INTRAVENOUS
  Filled 2019-08-31: qty 20

## 2019-08-31 MED ORDER — ALBUTEROL SULFATE HFA 108 (90 BASE) MCG/ACT IN AERS: 2.0000 | INHALATION_SPRAY | Freq: Once | RESPIRATORY_TRACT | Status: DC | PRN

## 2019-08-31 MED ORDER — DIPHENHYDRAMINE HCL 50 MG/ML IJ SOLN: 50.0000 mg | Freq: Once | INTRAMUSCULAR | Status: DC | PRN

## 2019-08-31 MED ORDER — SODIUM CHLORIDE 0.9 % IV SOLN
INTRAVENOUS | Status: DC | PRN
  Administered 2019-08-31: 250 mL via INTRAVENOUS

## 2019-08-31 MED ORDER — EPINEPHRINE 0.3 MG/0.3ML IJ SOAJ: 0.3000 mg | Freq: Once | INTRAMUSCULAR | Status: DC | PRN

## 2019-08-31 MED ORDER — FAMOTIDINE IN NACL 20-0.9 MG/50ML-% IV SOLN: 20.0000 mg | Freq: Once | INTRAVENOUS | Status: DC | PRN

## 2019-08-31 MED FILL — FREESTYLE LITE TEST STRIP: 50 days supply | Qty: 100 | Fill #0

## 2019-08-31 MED FILL — FREESTYLE LANCETS: 50 days supply | Qty: 100 | Fill #0

## 2019-08-31 NOTE — Discharge Instructions (Signed)
COVID-19 COVID-19 is a respiratory infection that is caused by a virus called severe acute respiratory syndrome coronavirus 2 (SARS-CoV-2). The disease is also known as coronavirus disease or novel coronavirus. In some people, the virus may not cause any symptoms. In others, it may cause a serious infection. The infection can get worse quickly and can lead to complications, such as:  Pneumonia, or infection of the lungs.  Acute respiratory distress syndrome or ARDS. This is a condition in which fluid build-up in the lungs prevents the lungs from filling with air and passing oxygen into the blood.  Acute respiratory failure. This is a condition in which there is not enough oxygen passing from the lungs to the body or when carbon dioxide is not passing from the lungs out of the body.  Sepsis or septic shock. This is a serious bodily reaction to an infection.  Blood clotting problems.  Secondary infections due to bacteria or fungus.  Organ failure. This is when your body's organs stop working. The virus that causes COVID-19 is contagious. This means that it can spread from person to person through droplets from coughs and sneezes (respiratory secretions). What are the causes? This illness is caused by a virus. You may catch the virus by:  Breathing in droplets from an infected person. Droplets can be spread by a person breathing, speaking, singing, coughing, or sneezing.  Touching something, like a table or a doorknob, that was exposed to the virus (contaminated) and then touching your mouth, nose, or eyes. What increases the risk? Risk for infection You are more likely to be infected with this virus if you:  Are within 6 feet (2 meters) of a person with COVID-19.  Provide care for or live with a person who is infected with COVID-19.  Spend time in crowded indoor spaces or live in shared housing. Risk for serious illness You are more likely to become seriously ill from the virus if  you:  Are 50 years of age or older. The higher your age, the more you are at risk for serious illness.  Live in a nursing home or long-term care facility.  Have cancer.  Have a long-term (chronic) disease such as: ? Chronic lung disease, including chronic obstructive pulmonary disease or asthma. ? A long-term disease that lowers your body's ability to fight infection (immunocompromised). ? Heart disease, including heart failure, a condition in which the arteries that lead to the heart become narrow or blocked (coronary artery disease), a disease which makes the heart muscle thick, weak, or stiff (cardiomyopathy). ? Diabetes. ? Chronic kidney disease. ? Sickle cell disease, a condition in which red blood cells have an abnormal "sickle" shape. ? Liver disease.  Are obese. What are the signs or symptoms? Symptoms of this condition can range from mild to severe. Symptoms may appear any time from 2 to 14 days after being exposed to the virus. They include:  A fever or chills.  A cough.  Difficulty breathing.  Headaches, body aches, or muscle aches.  Runny or stuffy (congested) nose.  A sore throat.  New loss of taste or smell. Some people may also have stomach problems, such as nausea, vomiting, or diarrhea. Other people may not have any symptoms of COVID-19. How is this diagnosed? This condition may be diagnosed based on:  Your signs and symptoms, especially if: ? You live in an area with a COVID-19 outbreak. ? You recently traveled to or from an area where the virus is common. ? You   provide care for or live with a person who was diagnosed with COVID-19. ? You were exposed to a person who was diagnosed with COVID-19.  A physical exam.  Lab tests, which may include: ? Taking a sample of fluid from the back of your nose and throat (nasopharyngeal fluid), your nose, or your throat using a swab. ? A sample of mucus from your lungs (sputum). ? Blood tests.  Imaging tests,  which may include, X-rays, CT scan, or ultrasound. How is this treated? At present, there is no medicine to treat COVID-19. Medicines that treat other diseases are being used on a trial basis to see if they are effective against COVID-19. Your health care provider will talk with you about ways to treat your symptoms. For most people, the infection is mild and can be managed at home with rest, fluids, and over-the-counter medicines. Treatment for a serious infection usually takes places in a hospital intensive care unit (ICU). It may include one or more of the following treatments. These treatments are given until your symptoms improve.  Receiving fluids and medicines through an IV.  Supplemental oxygen. Extra oxygen is given through a tube in the nose, a face mask, or a hood.  Positioning you to lie on your stomach (prone position). This makes it easier for oxygen to get into the lungs.  Continuous positive airway pressure (CPAP) or bi-level positive airway pressure (BPAP) machine. This treatment uses mild air pressure to keep the airways open. A tube that is connected to a motor delivers oxygen to the body.  Ventilator. This treatment moves air into and out of the lungs by using a tube that is placed in your windpipe.  Tracheostomy. This is a procedure to create a hole in the neck so that a breathing tube can be inserted.  Extracorporeal membrane oxygenation (ECMO). This procedure gives the lungs a chance to recover by taking over the functions of the heart and lungs. It supplies oxygen to the body and removes carbon dioxide. Follow these instructions at home: Lifestyle  If you are sick, stay home except to get medical care. Your health care provider will tell you how long to stay home. Call your health care provider before you go for medical care.  Rest at home as told by your health care provider.  Do not use any products that contain nicotine or tobacco, such as cigarettes,  e-cigarettes, and chewing tobacco. If you need help quitting, ask your health care provider.  Return to your normal activities as told by your health care provider. Ask your health care provider what activities are safe for you. General instructions  Take over-the-counter and prescription medicines only as told by your health care provider.  Drink enough fluid to keep your urine pale yellow.  Keep all follow-up visits as told by your health care provider. This is important. How is this prevented?  There is no vaccine to help prevent COVID-19 infection. However, there are steps you can take to protect yourself and others from this virus. To protect yourself:   Do not travel to areas where COVID-19 is a risk. The areas where COVID-19 is reported change often. To identify high-risk areas and travel restrictions, check the CDC travel website: wwwnc.cdc.gov/travel/notices  If you live in, or must travel to, an area where COVID-19 is a risk, take precautions to avoid infection. ? Stay away from people who are sick. ? Wash your hands often with soap and water for 20 seconds. If soap and water   are not available, use an alcohol-based hand sanitizer. ? Avoid touching your mouth, face, eyes, or nose. ? Avoid going out in public, follow guidance from your state and local health authorities. ? If you must go out in public, wear a cloth face covering or face mask. Make sure your mask covers your nose and mouth. ? Avoid crowded indoor spaces. Stay at least 6 feet (2 meters) away from others. ? Disinfect objects and surfaces that are frequently touched every day. This may include:  Counters and tables.  Doorknobs and light switches.  Sinks and faucets.  Electronics, such as phones, remote controls, keyboards, computers, and tablets. To protect others: If you have symptoms of COVID-19, take steps to prevent the virus from spreading to others.  If you think you have a COVID-19 infection, contact  your health care provider right away. Tell your health care team that you think you may have a COVID-19 infection.  Stay home. Leave your house only to seek medical care. Do not use public transport.  Do not travel while you are sick.  Wash your hands often with soap and water for 20 seconds. If soap and water are not available, use alcohol-based hand sanitizer.  Stay away from other members of your household. Let healthy household members care for children and pets, if possible. If you have to care for children or pets, wash your hands often and wear a mask. If possible, stay in your own room, separate from others. Use a different bathroom.  Make sure that all people in your household wash their hands well and often.  Cough or sneeze into a tissue or your sleeve or elbow. Do not cough or sneeze into your hand or into the air.  Wear a cloth face covering or face mask. Make sure your mask covers your nose and mouth. Where to find more information  Centers for Disease Control and Prevention: www.cdc.gov/coronavirus/2019-ncov/index.html  World Health Organization: www.who.int/health-topics/coronavirus Contact a health care provider if:  You live in or have traveled to an area where COVID-19 is a risk and you have symptoms of the infection.  You have had contact with someone who has COVID-19 and you have symptoms of the infection. Get help right away if:  You have trouble breathing.  You have pain or pressure in your chest.  You have confusion.  You have bluish lips and fingernails.  You have difficulty waking from sleep.  You have symptoms that get worse. These symptoms may represent a serious problem that is an emergency. Do not wait to see if the symptoms will go away. Get medical help right away. Call your local emergency services (911 in the [place] Do not drive yourself to the hospital. Let the emergency medical personnel know if you think you have  COVID-19. Summary  COVID-19 is a respiratory infection that is caused by a virus. It is also known as coronavirus disease or novel coronavirus. It can cause serious infections, such as pneumonia, acute respiratory distress syndrome, acute respiratory failure, or sepsis.  The virus that causes COVID-19 is contagious. This means that it can spread from person to person through droplets from breathing, speaking, singing, coughing, or sneezing.  You are more likely to develop a serious illness if you are 50 years of age or older, have a weak immune system, live in a nursing home, or have chronic disease.  There is no medicine to treat COVID-19. Your health care provider will talk with you about ways to treat your symptoms.    Take steps to protect yourself and others from infection. Wash your hands often and disinfect objects and surfaces that are frequently touched every day. Stay away from people who are sick and wear a mask if you are sick. This information is not intended to replace advice given to you by your health care provider. Make sure you discuss any questions you have with your health care provider. Document Revised: 05/19/2019 Document Reviewed: 08/25/2018 Elsevier Patient Education  2020 Elsevier Inc. What types of side effects do monoclonal antibody drugs cause?  Common side effects  In general, the more common side effects caused by monoclonal antibody drugs include: . Allergic reactions, such as hives or itching . Flu-like signs and symptoms, including chills, fatigue, fever, and muscle aches and pains . Nausea, vomiting . Diarrhea . Skin rashes . Low blood pressure   The CDC is recommending patients who receive monoclonal antibody treatments wait at least 90 days before being vaccinated.  Currently, there are no data on the safety and efficacy of mRNA COVID-19 vaccines in persons who received monoclonal antibodies or convalescent plasma as part of COVID-19 treatment. Based  on the estimated half-life of such therapies as well as evidence suggesting that reinfection is uncommon in the 90 days after initial infection, vaccination should be deferred for at least 90 days, as a precautionary measure until additional information becomes available, to avoid interference of the antibody treatment with vaccine-induced immune responses. 

## 2019-08-31 NOTE — Progress Notes (Signed)
  Diagnosis: COVID-19  Physician: Dr. Wright  Procedure: Covid Infusion Clinic Med: bamlanivimab infusion - Provided patient with bamlanimivab fact sheet for patients, parents and caregivers prior to infusion.  Complications: No immediate complications noted.  Discharge: Discharged home   Roy Jennings 08/31/2019  

## 2019-09-04 DIAGNOSIS — U071 COVID-19: Secondary | ICD-10-CM | POA: Diagnosis not present

## 2019-09-04 DIAGNOSIS — E119 Type 2 diabetes mellitus without complications: Secondary | ICD-10-CM | POA: Diagnosis not present

## 2019-09-07 ENCOUNTER — Other Ambulatory Visit: Payer: Self-pay | Admitting: Endocrinology

## 2019-09-07 MED FILL — TRESIBA FLEXTOUCH 200 UNITS: 200 | 28 days supply | Qty: 12 | Fill #2

## 2019-09-07 MED FILL — TRULICITY 3 MG/0.5ML SOPN: 3 | 28 days supply | Qty: 2 | Fill #2

## 2019-09-07 MED FILL — JARDIANCE 25 MG TABLET: 25 | 30 days supply | Qty: 30 | Fill #0

## 2019-09-11 DIAGNOSIS — Z8616 Personal history of COVID-19: Secondary | ICD-10-CM | POA: Diagnosis not present

## 2019-09-11 DIAGNOSIS — U071 COVID-19: Secondary | ICD-10-CM | POA: Diagnosis not present

## 2019-10-09 ENCOUNTER — Other Ambulatory Visit: Payer: Self-pay | Admitting: Endocrinology

## 2019-10-09 MED FILL — TRESIBA FLEXTOUCH 200 UNITS: 200 | 21 days supply | Qty: 9 | Fill #3

## 2019-10-09 MED FILL — JARDIANCE 25 MG TABLET: 25 | 30 days supply | Qty: 30 | Fill #1

## 2019-10-10 MED FILL — TRULICITY 3 MG/0.5ML SOPN: 3 | 28 days supply | Qty: 2 | Fill #0

## 2019-10-16 DIAGNOSIS — I10 Essential (primary) hypertension: Secondary | ICD-10-CM | POA: Diagnosis not present

## 2019-10-16 MED FILL — LISINOPRIL 5 MG TABS: 5 | 90 days supply | Qty: 90 | Fill #0

## 2019-10-18 ENCOUNTER — Other Ambulatory Visit: Payer: Self-pay | Admitting: Endocrinology

## 2019-10-20 MED FILL — TRESIBA FLEXTOUCH 200 UNITS: 200 | 21 days supply | Qty: 9 | Fill #0

## 2019-11-09 MED FILL — TRESIBA FLEXTOUCH 200 UNITS: 200 | 21 days supply | Qty: 9 | Fill #1

## 2019-11-09 MED FILL — JARDIANCE 25 MG TABLET: 25 | 30 days supply | Qty: 30 | Fill #2

## 2019-11-13 MED FILL — TRULICITY 3 MG/0.5ML SOPN: 3 | 28 days supply | Qty: 2 | Fill #1

## 2019-11-30 ENCOUNTER — Other Ambulatory Visit: Payer: Self-pay | Admitting: Endocrinology

## 2019-12-01 ENCOUNTER — Ambulatory Visit: Payer: 59 | Attending: Internal Medicine

## 2019-12-01 ENCOUNTER — Other Ambulatory Visit: Payer: Self-pay | Admitting: Endocrinology

## 2019-12-01 DIAGNOSIS — Z23 Encounter for immunization: Secondary | ICD-10-CM

## 2019-12-01 MED FILL — TRESIBA FLEXTOUCH 200 UNITS: 200 | 21 days supply | Qty: 9 | Fill #0

## 2019-12-01 NOTE — Progress Notes (Signed)
   Covid-19 Vaccination Clinic  Name:  Roy Jennings    MRN: [id number] DOB: [birth year]  12/01/2019  [name] was observed post Covid-19 immunization for 15 minutes without incident. He was provided with Vaccine Information Sheet and instruction to access the V-Safe system.   [name] was instructed to call 911 with any severe reactions post vaccine: Marland Kitchen Difficulty breathing  . Swelling of face and throat  . A fast heartbeat  . A bad rash all over body  . Dizziness and weakness   Immunizations Administered    Name Date Dose VIS Date Route   Pfizer COVID-19 Vaccine 12/01/2019  4:56 PM 0.3 mL 09/27/2018 Intramuscular   Manufacturer: [employer], [employer]   Lot: [id number]   NDC: [address]-9

## 2019-12-05 ENCOUNTER — Other Ambulatory Visit: Payer: Self-pay | Admitting: Endocrinology

## 2019-12-05 MED FILL — TRULICITY 3 MG/0.5ML SOPN: 3 | 28 days supply | Qty: 2 | Fill #2

## 2019-12-05 MED FILL — JARDIANCE 25 MG TABLET: 25 | 30 days supply | Qty: 30 | Fill #0

## 2019-12-14 ENCOUNTER — Other Ambulatory Visit: Payer: Self-pay

## 2019-12-14 ENCOUNTER — Other Ambulatory Visit (INDEPENDENT_AMBULATORY_CARE_PROVIDER_SITE_OTHER): Payer: 59

## 2019-12-14 DIAGNOSIS — Z794 Long term (current) use of insulin: Secondary | ICD-10-CM | POA: Diagnosis not present

## 2019-12-14 DIAGNOSIS — E1165 Type 2 diabetes mellitus with hyperglycemia: Secondary | ICD-10-CM

## 2019-12-14 LAB — COMPREHENSIVE METABOLIC PANEL
ALT: 23 U/L (ref 0–53)
AST: 19 U/L (ref 0–37)
Albumin: 4.2 g/dL (ref 3.5–5.2)
Alkaline Phosphatase: 77 U/L (ref 39–117)
BUN: 8 mg/dL (ref 6–23)
CO2: 29 mEq/L (ref 19–32)
Calcium: 9.4 mg/dL (ref 8.4–10.5)
Chloride: 103 mEq/L (ref 96–112)
Creatinine, Ser: 1.06 mg/dL (ref 0.40–1.50)
GFR: 93.65 mL/min (ref >60.00)
Glucose, Bld: 140 mg/dL — ABNORMAL HIGH (ref 70–99)
Potassium: 3.9 mEq/L (ref 3.5–5.1)
Sodium: 138 mEq/L (ref 135–145)
Total Bilirubin: 0.5 mg/dL (ref 0.2–1.2)
Total Protein: 7.5 g/dL (ref 6.0–8.3)

## 2019-12-14 LAB — LIPID PANEL
Cholesterol: 149 mg/dL (ref 0–200)
HDL: 29.8 mg/dL — ABNORMAL LOW (ref >39.00)
NonHDL: 119.16
Total CHOL/HDL Ratio: 5
Triglycerides: 218 mg/dL — ABNORMAL HIGH (ref 0.0–149.0)
VLDL: 43.6 mg/dL — ABNORMAL HIGH (ref 0.0–40.0)

## 2019-12-14 LAB — HEMOGLOBIN A1C: Hgb A1c MFr Bld: 7.5 % — ABNORMAL HIGH (ref 4.6–6.5)

## 2019-12-14 LAB — LDL CHOLESTEROL, DIRECT: Direct LDL: 67 mg/dL

## 2019-12-14 NOTE — Patient Instructions (Signed)
[id number] 

## 2019-12-20 ENCOUNTER — Encounter: Payer: 59 | Admitting: Endocrinology

## 2019-12-20 ENCOUNTER — Encounter: Payer: Self-pay | Admitting: Endocrinology

## 2019-12-20 ENCOUNTER — Other Ambulatory Visit: Payer: Self-pay

## 2019-12-21 NOTE — Progress Notes (Signed)
This encounter was created in error - please disregard.

## 2019-12-23 ENCOUNTER — Ambulatory Visit: Payer: 59 | Attending: Internal Medicine

## 2019-12-23 DIAGNOSIS — Z23 Encounter for immunization: Secondary | ICD-10-CM

## 2019-12-23 NOTE — Progress Notes (Signed)
   Covid-19 Vaccination [facility]  Name:  MADDEN GARRON    MRN: [id number] DOB: [birth year]  12/23/2019  Mr. Mcquaig was observed post Covid-19 immunization for 15 minutes without incident. He was provided with Vaccine Information Sheet and instruction to access the V-Safe system.   Mr. Krakow was instructed to call 911 with any severe reactions post vaccine: Marland Kitchen Difficulty breathing  . Swelling of face and throat  . A fast heartbeat  . A bad rash all over body  . Dizziness and weakness   Immunizations Administered    Name Date Dose VIS Date Route   Pfizer COVID-19 Vaccine 12/23/2019  8:57 AM 0.3 mL 09/27/2018 Intramuscular   Manufacturer: [employer], [employer]   Lot: [id number]   NDC: [address]-5

## 2019-12-25 ENCOUNTER — Other Ambulatory Visit: Payer: Self-pay | Admitting: Endocrinology

## 2019-12-25 ENCOUNTER — Ambulatory Visit: Payer: 59

## 2019-12-26 MED FILL — TRESIBA FLEXTOUCH 200 UNITS: 200 | 21 days supply | Qty: 9 | Fill #0

## 2019-12-27 MED FILL — TRULICITY 3 MG/0.5ML SOPN: 3 | 28 days supply | Qty: 2 | Fill #0

## 2019-12-29 ENCOUNTER — Other Ambulatory Visit: Payer: Self-pay

## 2019-12-29 ENCOUNTER — Ambulatory Visit (INDEPENDENT_AMBULATORY_CARE_PROVIDER_SITE_OTHER): Payer: 59 | Admitting: Endocrinology

## 2019-12-29 ENCOUNTER — Encounter: Payer: Self-pay | Admitting: Endocrinology

## 2019-12-29 VITALS — BP 130/90 | HR 108 | Ht 72.0 in | Wt 278.0 lb

## 2019-12-29 DIAGNOSIS — I10 Essential (primary) hypertension: Secondary | ICD-10-CM

## 2019-12-29 DIAGNOSIS — E1165 Type 2 diabetes mellitus with hyperglycemia: Secondary | ICD-10-CM | POA: Diagnosis not present

## 2019-12-29 DIAGNOSIS — E119 Type 2 diabetes mellitus without complications: Secondary | ICD-10-CM | POA: Diagnosis not present

## 2019-12-29 DIAGNOSIS — Z6837 Body mass index (BMI) 37.0-37.9, adult: Secondary | ICD-10-CM

## 2019-12-29 DIAGNOSIS — Z794 Long term (current) use of insulin: Secondary | ICD-10-CM

## 2019-12-29 DIAGNOSIS — E669 Obesity, unspecified: Secondary | ICD-10-CM

## 2019-12-29 LAB — GLUCOSE, POCT (MANUAL RESULT ENTRY): POC Glucose: 109 mg/dl — AB (ref 70–99)

## 2019-12-29 NOTE — Progress Notes (Signed)
Patient ID: Roy Jennings, male   DOB: Apr 02, 1981, 39 y.o.   MRN: [id number]            Reason for Appointment: Follow-up for Type 2 Diabetes    History of Present Illness:          Date of diagnosis of type 2 diabetes mellitus: 12/2011       Background history:   He was initially diagnosed with marked hyperglycemia and A1c of 13.4% He was apparently started on insulin at onset and has taken various insulin regimens in the past He has had difficulty tolerating metformin and has not taken this most of the time He has probably been on Lantus since about 08/2015, previously on the 70/30 insulin His level of control had improved initially in 2014 and 2015  Recent history:   INSULIN regimen is: 86 units Tresiba in am, Humalog none   Non-insulin hypoglycemic drugs the patient is taking are:  Jardiance 25 mg taking, Trulicity 3 mg weekly  His A1c is 7.5 compared to 7.1  Highest level previously had been 10% %   Current management, blood sugar patterns and problems identified:  He has not been seen since 12/20  He works at nights and mostly checking blood sugars between about 2 PM and 4 AM although may not be after a meal at that time  He is having variable blood sugars overnight with readings as high as 303  It appears now that he is eating high-fat meals regularly in the evenings and may eat pizza 2-3 times a week  However his fasting blood sugars in the mornings are usually near normal  He has lost weight since last year although not clear it is better recently or not  He is still trying to walk regularly up to 1 hour  As before he has not taken any Humalog for his meals  Trulicity was increased on his last visit to 3 mg but he does not have much improvement with this and his A1c is higher        Dinner usually at 7-8 pm   Side effects from medications have been: Diarrhea from most doses of metformin  Compliance with the medical regimen: Inconsistent  Hypoglycemia:    none  Glucose monitoring:  done 0-1 times a day         Glucometer: ?  Accu-Chek   Blood Glucose readings from download show overall average 182   Fasting range 104-111  Blood sugars at 1-4 AM = 163-303 with average about 200  Self-care: The diet that the patient has been following is: None   Typical meal intake: Variable        Dietician visit, most recent: 08/2015, diabetes educator in 08/2018               Exercise: walking up to 60 min in the mornings   Weight history:   Wt Readings from Last 3 Encounters:  12/29/19 278 lb (126.1 kg)  12/14/18 274 lb 6.4 oz (124.5 kg)  09/08/18 291 lb 9.6 oz (132.3 kg)    Glycemic control:   Lab Results  Component Value Date   HGBA1C 7.5 (H) 12/14/2019   HGBA1C 7.1 (H) 06/28/2019   HGBA1C 7.6 (H) 03/21/2019   Lab Results  Component Value Date   MICROALBUR 2.3 (H) 03/21/2019   LDLCALC 83 04/28/2017   CREATININE 1.06 12/14/2019   Lab Results  Component Value Date   MICRALBCREAT 2.6 03/21/2019    Lab Results  Component Value Date   FRUCTOSAMINE 283 08/31/2018   FRUCTOSAMINE 292 (H) 03/23/2018   FRUCTOSAMINE 275 (H) 11/05/2017      Allergies as of 12/29/2019      Reactions   Metformin And Related    Diarrhea, nausea      Medication List       Accurate as of Dec 29, 2019 11:59 PM. If you have any questions, ask your nurse or doctor.        FreeStyle Freedom Lite w/Device Kit Use as instructed to check blood sugar twice daily.   freestyle lancets 1 each by Other route 2 (two) times daily. for testing   glucose blood test strip Commonly known as: FREESTYLE LITE Use as instructed to check blood sugar twice daily.   Insulin Pen Needle 31G X 8 MM Misc Commonly known as: Unifine Pentips USE TO INJECT INSULIN 1-3 TIMES INTO SKIN DAILY   Jardiance 25 MG Tabs tablet Generic drug: empagliflozin TAKE 1 TABLET BY MOUTH ONCE DAILY   Tresiba FlexTouch 200 UNIT/ML FlexTouch Pen Generic drug: insulin degludec  INJECT 86 UNITS INTO THE SKIN DAILY   Trulicity 3 OF/7.5ZW Sopn Generic drug: Dulaglutide INJECT 3 MG INTO THE SKIN ONCE A WEEK.       Allergies:  Allergies  Allergen Reactions  . Metformin And Related     Diarrhea, nausea    Past Medical History:  Diagnosis Date  . Diabetes mellitus 12/30/11  . DKA (diabetic ketoacidoses) ([place]) 12/30/11   new dx DM a1c 13  . PVC (premature ventricular contraction)    LBB inferior axis QRS // followed by [name]  . Right bundle branch block     Past Surgical History:  Procedure Laterality Date  . KNEE ARTHROSCOPY  1999-2000   bilateral    Family History  Problem Relation Age of Onset  . Diabetes Father   . Diabetes Maternal Grandmother   . Hypertension Mother   . CAD Neg Hx     Social History:  reports that he has never smoked. He has never used smokeless tobacco. He reports current alcohol use. He reports that he does not use drugs.   Review of Systems   Lipid history: His lipids are not optimally controlled, not on any medications currently, previously had been on Lipitor and pravastatin  Has history of high triglycerides    Lab Results  Component Value Date   CHOL 149 12/14/2019   HDL 29.80 (L) 12/14/2019   LDLCALC 83 04/28/2017   LDLDIRECT 67.0 12/14/2019   TRIG 218.0 (H) 12/14/2019   CHOLHDL 5 12/14/2019           Hypertension: He is not on any treatment He is on Jardiance 25 mg   BP Readings from Last 3 Encounters:  12/29/19 130/90  08/31/19 132/86  12/14/18 122/84    Most recent eye exam was 08/2017  Most recent foot exam: 11/2017    LABS:  Office Visit on 12/29/2019  Component Date Value Ref Range Status  . POC Glucose 12/29/2019 109* 70 - 99 mg/dl Final    Physical Examination:  BP 130/90 (BP Location: Left Arm, Patient Position: Sitting, Cuff Size: Large)   Pulse (!) 108   Ht 6' (1.829 m)   Wt 278 lb (126.1 kg)   SpO2 97%   BMI 37.70 kg/m       ASSESSMENT:  Diabetes  type 2, with obesity  See history of present illness for detailed discussion of current diabetes management, blood sugar  patterns and problems identified  His A1c is 7.5 and getting higher  His main difficulty is compliance and understanding of meal planning and avoiding high-fat foods like pizza He is checking blood sugars only randomly during the night while working and these do not show any consistent patterns Fasting readings are usually fairly good Not clear if his blood sugars go up after his evening meal or whether they are only higher sometimes at night  Hypertension: His blood pressure appears to be consistently high although has been somewhat stressed today  Recommended that he set up a follow-up with his PCP to discuss treatment  Lipids: LDL below 100 without any treatment but triglycerides are higher likely to be from inconsistent diet   PLAN:    More regular follow-up  Increase Trulicity to 4.5 mg for maximum benefit  He can finish his current prescription if needed  Discussed the importance of checking blood sugars by rotation at different times including 2 hours after dinner  He will need to stop eating high-fat foods like pizza and have balanced meals  Discussed that if he has consistently high readings after dinner we will need to start back on Humalog insulin at that time  He is reluctant to see the dietitian but his wife is agreeable to doing this  Continue regular walking,  No change in basal insulin unless fasting blood sugars start getting lower Follow-up in 3 months  Patient Instructions  Check blood sugars on waking up 4 days a week  Also check blood sugars about 2 hours after meals and do this after different meals by rotation  Recommended blood sugar levels on waking up are 90-130 and about 2 hours after meal is 130-160  Please bring your blood sugar monitor to each visit, thank you     Elayne Snare 01/01/2020, 11:52 AM   Note: This office  note was prepared with Dragon voice recognition system technology. Any transcriptional errors that result from this process are unintentional.

## 2019-12-29 NOTE — Patient Instructions (Addendum)
Check blood sugars on waking up 4  days a week  Also check blood sugars about 2 hours after meals and do this after different meals by rotation  Recommended blood sugar levels on waking up are 90-130 and about 2 hours after meal is 130-160  Please bring your blood sugar monitor to each visit, thank you  

## 2020-01-01 MED ORDER — TRULICITY 4.5 MG/0.5ML ~~LOC~~ SOAJ: 4.5000 mg | SUBCUTANEOUS | 2 refills | Status: DC

## 2020-01-02 MED FILL — TRULICITY 4.5 MG/0.5ML SOPN: 4.5 | 28 days supply | Qty: 2 | Fill #0

## 2020-01-04 ENCOUNTER — Telehealth: Payer: Self-pay | Admitting: Endocrinology

## 2020-01-04 NOTE — Telephone Encounter (Signed)
Warning letter created and sent

## 2020-01-17 ENCOUNTER — Other Ambulatory Visit: Payer: Self-pay | Admitting: Endocrinology

## 2020-01-17 MED FILL — JARDIANCE 25 MG TABLET: 25 | 30 days supply | Qty: 30 | Fill #1

## 2020-01-17 MED FILL — TRESIBA FLEXTOUCH 200 UNITS: 200 | 21 days supply | Qty: 9 | Fill #0

## 2020-02-02 ENCOUNTER — Other Ambulatory Visit: Payer: Self-pay | Admitting: Endocrinology

## 2020-02-06 MED FILL — TRESIBA FLEXTOUCH 200 UNITS: 200 | 21 days supply | Qty: 9 | Fill #0

## 2020-02-28 MED FILL — JARDIANCE 25 MG TABLET: 25 | 30 days supply | Qty: 30 | Fill #2

## 2020-02-29 MED FILL — TRESIBA FLEXTOUCH 200 UNITS: 200 | 14 days supply | Qty: 6 | Fill #1

## 2020-02-29 MED FILL — TRULICITY 4.5 MG/0.5ML SOPN: 4.5 | 28 days supply | Qty: 2 | Fill #1

## 2020-03-10 ENCOUNTER — Ambulatory Visit (HOSPITAL_COMMUNITY)
Admission: EM | Admit: 2020-03-10 | Discharge: 2020-03-10 | Disposition: A | Discharge status: Home / Self Care | Payer: 59 | Attending: Emergency Medicine | Admitting: Emergency Medicine

## 2020-03-10 ENCOUNTER — Other Ambulatory Visit: Payer: Self-pay

## 2020-03-10 ENCOUNTER — Encounter (HOSPITAL_COMMUNITY): Payer: Self-pay | Admitting: *Deleted

## 2020-03-10 DIAGNOSIS — Z794 Long term (current) use of insulin: Secondary | ICD-10-CM | POA: Insufficient documentation

## 2020-03-10 DIAGNOSIS — E119 Type 2 diabetes mellitus without complications: Secondary | ICD-10-CM | POA: Insufficient documentation

## 2020-03-10 DIAGNOSIS — R197 Diarrhea, unspecified: Secondary | ICD-10-CM | POA: Diagnosis not present

## 2020-03-10 DIAGNOSIS — I493 Ventricular premature depolarization: Secondary | ICD-10-CM | POA: Diagnosis not present

## 2020-03-10 DIAGNOSIS — Z79899 Other long term (current) drug therapy: Secondary | ICD-10-CM | POA: Insufficient documentation

## 2020-03-10 DIAGNOSIS — Z20822 Contact with and (suspected) exposure to covid-19: Secondary | ICD-10-CM | POA: Diagnosis not present

## 2020-03-10 DIAGNOSIS — R1084 Generalized abdominal pain: Secondary | ICD-10-CM | POA: Diagnosis not present

## 2020-03-10 MED ORDER — ONDANSETRON HCL 4 MG PO TABS: 4.0000 mg | ORAL_TABLET | Freq: Three times a day (TID) | ORAL | 0 refills | Status: DC | PRN

## 2020-03-10 NOTE — ED Triage Notes (Addendum)
C/O intermittent generalized abd cramping x 12 hrs.  States has had "a little diarrhea".  Denies vomiting, but c/o slight nausea. Also mentions right flank pain intermittently over past couple weeks. Pt declines Covid test today.

## 2020-03-10 NOTE — ED Provider Notes (Signed)
[name]    CSN: [id number] Arrival date & time: 03/10/20  1054      History   Chief Complaint Chief Complaint  Patient presents with   Abdominal Pain   Flank Pain    HPI Roy Jennings is a 39 y.o. male.   Roy Jennings presents with complaints of abodminal pain which started last night. Sharp pains. Had two BM's, loose. Got sweaty with last BM, no vomiting. Pain was lasting 20-30 seconds, coming and going. This morning around 0600 still had some pain, but has since improved. Still some lingering discomfort, but overall improved. Last BM was around 0300. No blood or black to stool. No current nausea. No fevers. Hadn't eaten out or eaten any concerning/ spoiled food. No known ill contacts. No URI symptoms. Works in Librarian, academic. Has had his covid-19 vaccinations.    ROS per HPI, negative if not otherwise mentioned.      Past Medical History:  Diagnosis Date   Diabetes mellitus 12/30/11   DKA (diabetic ketoacidoses) ([place]) 12/30/11   new dx DM a1c 13   PVC (premature ventricular contraction)    LBB inferior axis QRS // followed by [name]   Right bundle branch block     Patient Active Problem List   Diagnosis Date Noted   Elevated LDL cholesterol level 04/28/2017   Personal history of noncompliance with medical treatment, presenting hazards to health 07/16/2016   GAD (generalized anxiety disorder) 02/07/2016   Diabetes ([place]) 09/02/2015   Elevated serum creatinine 08/14/2015   Uncontrolled diabetes mellitus type 2 without complications 75/05/2584   Obesity 08/14/2015   Rectal pain 10/03/2014   Hypercholesteremia 01/05/2012   PVC (premature ventricular contraction) 01/07/2011   Morbid obesity ([place]) 01/07/2011    Past Surgical History:  Procedure Laterality Date   KNEE ARTHROSCOPY  1999-2000   bilateral       Home Medications    Prior to Admission medications   Medication Sig Start Date End Date Taking? Authorizing  Provider  Dulaglutide (TRULICITY) 4.5 ID/7.8EU SOPN Inject 4.5 mg as directed once a week. 01/01/20  Yes Elayne Snare, MD  JARDIANCE 25 MG TABS tablet TAKE 1 TABLET BY MOUTH ONCE DAILY 12/05/19  Yes Elayne Snare, MD  TRESIBA FLEXTOUCH 200 UNIT/ML FlexTouch Pen INJECT 86 UNITS INTO THE SKIN DAILY 02/06/20  Yes Elayne Snare, MD  Blood Glucose Monitoring Suppl (FREESTYLE FREEDOM LITE) w/Device KIT Use as instructed to check blood sugar twice daily. 12/14/18   Elayne Snare, MD  glucose blood (FREESTYLE LITE) test strip Use as instructed to check blood sugar twice daily. 12/14/18   Elayne Snare, MD  Insulin Pen Needle (UNIFINE PENTIPS) 31G X 8 MM MISC USE TO INJECT INSULIN 1-3 TIMES INTO SKIN DAILY 02/19/18   Elayne Snare, MD  Lancets (FREESTYLE) lancets 1 each by Other route 2 (two) times daily. for testing 12/14/18   Elayne Snare, MD  ondansetron (ZOFRAN) 4 MG tablet Take 1 tablet (4 mg total) by mouth every 8 (eight) hours as needed for nausea or vomiting. 03/10/20   Zigmund Gottron, NP    Family History Family History  Problem Relation Age of Onset   Diabetes Father    Diabetes Maternal Grandmother    Hypertension Mother    CAD Neg Hx     Social History Social History   Tobacco Use   Smoking status: Never Smoker   Smokeless tobacco: Never Used  Scientific laboratory technician Use: Never used  Substance Use  Topics   Alcohol use: Yes    Alcohol/week: 0.0 standard drinks    Comment: rarely   Drug use: No     Allergies   Metformin and related   Review of Systems Review of Systems   Physical Exam Triage Vital Signs ED Triage Vitals  Enc Vitals Group     BP 03/10/20 1105 133/87     Pulse Rate 03/10/20 1105 99     Resp 03/10/20 1105 20     Temp 03/10/20 1105 98.5 F (36.9 C)     Temp Source 03/10/20 1105 Oral     SpO2 03/10/20 1105 97 %     Weight --      Height --      Head Circumference --      Peak Flow --      Pain Score 03/10/20 1104 6     Pain Loc --      Pain Edu? --       Excl. in [place]? --    No data found.  Updated Vital Signs BP 133/87    Pulse 99    Temp 98.5 F (36.9 C) (Oral)    Resp 20    SpO2 97%   Visual Acuity Right Eye Distance:   Left Eye Distance:   Bilateral Distance:    Right Eye Near:   Left Eye Near:    Bilateral Near:     Physical Exam Constitutional:      Appearance: He is well-developed. He is obese.  Cardiovascular:     Rate and Rhythm: Normal rate.  Pulmonary:     Effort: Pulmonary effort is normal.  Abdominal:     Palpations: Abdomen is soft.     Tenderness: There is no abdominal tenderness. There is no guarding or rebound.  Skin:    General: Skin is warm and dry.  Neurological:     Mental Status: He is alert and oriented to person, place, and time.      UC Treatments / Results  Labs (all labs ordered are listed, but only abnormal results are displayed) Labs Reviewed  SARS CORONAVIRUS 2 (TAT 6-24 HRS)    EKG   Radiology No results found.  Procedures Procedures (including critical care time)  Medications Ordered in UC Medications - No data to display  Initial Impression / Assessment and Plan / UC Course  I have reviewed the triage vital signs and the nursing notes.  Pertinent labs & imaging results that were available during my care of the patient were reviewed by me and considered in my medical decision making (see chart for details).     Non toxic. Benign physical exam.  Afebrile. No vomiting. No further stool. ~12 hours of symptoms, pain has dissipated currently. No red flag abdominal findings on exam. Supportive cares recommended.covid testing collected and pending as well. Return precautions provided. Patient verbalized understanding and agreeable to plan.   Final Clinical Impressions(s) / UC Diagnoses   Final diagnoses:  Generalized abdominal pain  Diarrhea of presumed infectious origin     Discharge Instructions     Small frequent sips of fluids- Pedialyte, Gatorade, water, broth- to  maintain hydration.   Bland diet as tolerated.  Zofran every 8 hours as needed for nausea or vomiting.   If symptoms worsen or do not improve in the next week to return to be seen or to follow up with your PCP.   Self isolate until covid results are back and negative.  Will notify you  by phone of any positive findings. Your negative results will be sent through your MyChart.         ED Prescriptions    Medication Sig Dispense Auth. Provider   ondansetron (ZOFRAN) 4 MG tablet Take 1 tablet (4 mg total) by mouth every 8 (eight) hours as needed for nausea or vomiting. 10 tablet Zigmund Gottron, NP     PDMP not reviewed this encounter.   Zigmund Gottron, NP 03/10/20 [phone]

## 2020-03-10 NOTE — Discharge Instructions (Addendum)
Small frequent sips of fluids- Pedialyte, Gatorade, water, broth- to maintain hydration.   Bland diet as tolerated.  Zofran every 8 hours as needed for nausea or vomiting.   If symptoms worsen or do not improve in the next week to return to be seen or to follow up with your PCP.   Self isolate until covid results are back and negative.  Will notify you by phone of any positive findings. Your negative results will be sent through your MyChart.

## 2020-03-11 LAB — SARS CORONAVIRUS 2 (TAT 6-24 HRS): SARS Coronavirus 2: NEGATIVE

## 2020-03-18 ENCOUNTER — Other Ambulatory Visit: Payer: Self-pay | Admitting: Endocrinology

## 2020-03-19 MED FILL — TRESIBA FLEXTOUCH 200 UNITS: 200 | 14 days supply | Qty: 6 | Fill #0

## 2020-03-20 ENCOUNTER — Other Ambulatory Visit: Payer: Self-pay | Admitting: Endocrinology

## 2020-04-02 ENCOUNTER — Other Ambulatory Visit: Payer: Self-pay

## 2020-04-02 ENCOUNTER — Other Ambulatory Visit (INDEPENDENT_AMBULATORY_CARE_PROVIDER_SITE_OTHER): Payer: 59

## 2020-04-02 DIAGNOSIS — Z794 Long term (current) use of insulin: Secondary | ICD-10-CM | POA: Diagnosis not present

## 2020-04-02 DIAGNOSIS — E1165 Type 2 diabetes mellitus with hyperglycemia: Secondary | ICD-10-CM

## 2020-04-02 LAB — COMPREHENSIVE METABOLIC PANEL
ALT: 24 U/L (ref 0–53)
AST: 22 U/L (ref 0–37)
Albumin: 4.7 g/dL (ref 3.5–5.2)
Alkaline Phosphatase: 74 U/L (ref 39–117)
BUN: 10 mg/dL (ref 6–23)
CO2: 28 mEq/L (ref 19–32)
Calcium: 10.3 mg/dL (ref 8.4–10.5)
Chloride: 103 mEq/L (ref 96–112)
Creatinine, Ser: 1.13 mg/dL (ref 0.40–1.50)
GFR: 86.85 mL/min (ref >60.00)
Glucose, Bld: 95 mg/dL (ref 70–99)
Potassium: 3.8 mEq/L (ref 3.5–5.1)
Sodium: 139 mEq/L (ref 135–145)
Total Bilirubin: 0.4 mg/dL (ref 0.2–1.2)
Total Protein: 8.2 g/dL (ref 6.0–8.3)

## 2020-04-02 LAB — MICROALBUMIN / CREATININE URINE RATIO
Creatinine,U: 121.9 mg/dL
Microalb Creat Ratio: 0.9 mg/g (ref 0.0–30.0)
Microalb, Ur: 1.1 mg/dL (ref 0.0–1.9)

## 2020-04-02 LAB — HEMOGLOBIN A1C: Hgb A1c MFr Bld: 6.8 % — ABNORMAL HIGH (ref 4.6–6.5)

## 2020-04-05 ENCOUNTER — Other Ambulatory Visit: Payer: Self-pay | Admitting: Endocrinology

## 2020-04-05 ENCOUNTER — Ambulatory Visit (INDEPENDENT_AMBULATORY_CARE_PROVIDER_SITE_OTHER): Payer: 59 | Admitting: Endocrinology

## 2020-04-05 ENCOUNTER — Other Ambulatory Visit: Payer: Self-pay

## 2020-04-05 VITALS — BP 128/82 | HR 79 | Ht 72.0 in | Wt 278.2 lb

## 2020-04-05 DIAGNOSIS — I10 Essential (primary) hypertension: Secondary | ICD-10-CM | POA: Diagnosis not present

## 2020-04-05 DIAGNOSIS — E119 Type 2 diabetes mellitus without complications: Secondary | ICD-10-CM | POA: Diagnosis not present

## 2020-04-05 DIAGNOSIS — Z794 Long term (current) use of insulin: Secondary | ICD-10-CM

## 2020-04-05 DIAGNOSIS — E782 Mixed hyperlipidemia: Secondary | ICD-10-CM | POA: Diagnosis not present

## 2020-04-05 LAB — POCT GLUCOSE (DEVICE FOR HOME USE): POC Glucose: 125 mg/dl — AB (ref 70–99)

## 2020-04-05 MED FILL — TRULICITY 4.5 MG/0.5ML SOPN: 4.5 | 28 days supply | Qty: 2 | Fill #2

## 2020-04-05 MED FILL — TRESIBA FLEXTOUCH 200 UNITS: 200 | 21 days supply | Qty: 9 | Fill #0

## 2020-04-05 MED FILL — JARDIANCE 25 MG TABLET: 25 | 90 days supply | Qty: 90 | Fill #0

## 2020-04-05 NOTE — Progress Notes (Signed)
Patient ID: Roy Jennings, male   DOB: [birth year], 39 y.o.   MRN: [id number]            Reason for Appointment: Follow-up for Type 2 Diabetes    History of Present Illness:          Date of diagnosis of type 2 diabetes mellitus: 12/2011       Background history:   He was initially diagnosed with marked hyperglycemia and A1c of 13.4% He was apparently started on insulin at onset and has taken various insulin regimens in the past He has had difficulty tolerating metformin and has not taken this most of the time He has probably been on Lantus since about 08/2015, previously on the 70/30 insulin His level of control had improved initially in 2014 and 2015  Recent history:   INSULIN regimen is: 86 units Tresiba in am, Humalog none   Non-insulin hypoglycemic drugs the patient is taking are:  Jardiance 25 mg taking, Trulicity 4.5 weekly   Current management, blood sugar patterns and problems identified:  His A1c is about the best he has had at 6.8 compared to 7.5   He is taking 4.5 mg Trulicity since 1/32  With this he appears to have better control  He is now working day shifts  Although he has been advised to check blood sugars consistently he has only 10 readings in the last month, all fasting  Also his meter time is programmed incorrectly  He thinks he is cutting back on high fat foods like pizza and fast food  However weight has stayed the same  He is starting to do some more exercise, going to the gym periodically and some walking  Fasting readings are fairly consistent without hypoglycemia  As before he has not taken any Humalog for his meals         Dinner usually at 7-8 pm   Side effects from medications have been: Diarrhea from most doses of metformin  Compliance with the medical regimen: Inconsistent  Hypoglycemia:   none  Glucose monitoring:  done 0-1 times a day         Glucometer: ?  Accu-Chek   Blood Glucose readings from download   AVERAGE 129,  range 117-145   Self-care: The diet that the patient has been following is: None   Typical meal intake: Variable        Dietician visit, most recent: 08/2015, diabetes educator in 08/2018               Exercise: walking up to 60 min in the mornings   Weight history:   Wt Readings from Last 3 Encounters:  04/05/20 278 lb 3.2 oz (126.2 kg)  12/29/19 278 lb (126.1 kg)  12/14/18 274 lb 6.4 oz (124.5 kg)    Glycemic control:   Lab Results  Component Value Date   HGBA1C 6.8 (H) 04/02/2020   HGBA1C 7.5 (H) 12/14/2019   HGBA1C 7.1 (H) 06/28/2019   Lab Results  Component Value Date   MICROALBUR 1.1 04/02/2020   LDLCALC 83 04/28/2017   CREATININE 1.13 04/02/2020   Lab Results  Component Value Date   MICRALBCREAT 0.9 04/02/2020    Lab Results  Component Value Date   FRUCTOSAMINE 283 08/31/2018   FRUCTOSAMINE 292 (H) 03/23/2018   FRUCTOSAMINE 275 (H) 11/05/2017      Allergies as of 04/05/2020      Reactions   Metformin And Related    Diarrhea, nausea  Medication List       Accurate as of April 05, 2020  9:01 PM. If you have any questions, ask your nurse or doctor.        FreeStyle Freedom Lite w/Device Kit Use as instructed to check blood sugar twice daily.   freestyle lancets 1 each by Other route 2 (two) times daily. for testing   glucose blood test strip Commonly known as: FREESTYLE LITE Use as instructed to check blood sugar twice daily.   Insulin Pen Needle 31G X 8 MM Misc Commonly known as: Unifine Pentips USE TO INJECT INSULIN 1-3 TIMES INTO SKIN DAILY   Jardiance 25 MG Tabs tablet Generic drug: empagliflozin TAKE 1 TABLET BY MOUTH ONCE DAILY   ondansetron 4 MG tablet Commonly known as: Zofran Take 1 tablet (4 mg total) by mouth every 8 (eight) hours as needed for nausea or vomiting.   [name] FlexTouch 200 UNIT/ML FlexTouch Pen Generic drug: insulin degludec INJECT 86 UNITS INTO THE SKIN DAILY   Trulicity 4.5 HW/3.8UE Sopn Generic  drug: Dulaglutide Inject 4.5 mg as directed once a week.       Allergies:  Allergies  Allergen Reactions  . Metformin And Related     Diarrhea, nausea    Past Medical History:  Diagnosis Date  . Diabetes mellitus 12/30/11  . DKA (diabetic ketoacidoses) ([place]) 12/30/11   new dx DM a1c 13  . PVC (premature ventricular contraction)    LBB inferior axis QRS // followed by [name]  . Right bundle branch block     Past Surgical History:  Procedure Laterality Date  . KNEE ARTHROSCOPY  1999-2000   bilateral    Family History  Problem Relation Age of Onset  . Diabetes Father   . Diabetes Maternal Grandmother   . Hypertension Mother   . CAD Neg Hx     Social History:  reports that he has never smoked. He has never used smokeless tobacco. He reports current alcohol use. He reports that he does not use drugs.   Review of Systems   Lipid history: His last LDL was fairly good but triglycerides are over 200 Not on a statin drug, last was on pravastatin in 2020   Lab Results  Component Value Date   CHOL 149 12/14/2019   HDL 29.80 (L) 12/14/2019   LDLCALC 83 04/28/2017   LDLDIRECT 67.0 12/14/2019   TRIG 218.0 (H) 12/14/2019   CHOLHDL 5 12/14/2019           Blood pressure: He is on Jardiance 25 mg only and his readings appear to be improved likely from better diet   BP Readings from Last 3 Encounters:  04/05/20 128/82  03/10/20 133/87  12/29/19 130/90    Most recent eye exam was 08/2017  Most recent foot exam: 11/2017    LABS:  Office Visit on 04/05/2020  Component Date Value Ref Range Status  . POC Glucose 04/05/2020 125* 70 - 99 mg/dl Final  Lab on 04/02/2020  Component Date Value Ref Range Status  . Microalb, Ur 04/02/2020 1.1  0.0 - 1.9 mg/dL Final  . Creatinine,U 04/02/2020 121.9  mg/dL Final  . Microalb Creat Ratio 04/02/2020 0.9  0.0 - 30.0 mg/g Final  . Sodium 04/02/2020 139  135 - 145 mEq/L Final  . Potassium 04/02/2020 3.8  3.5 - 5.1  mEq/L Final  . Chloride 04/02/2020 103  96 - 112 mEq/L Final  . CO2 04/02/2020 28  19 - 32 mEq/L Final  . Glucose,  Bld 04/02/2020 95  70 - 99 mg/dL Final  . BUN 04/02/2020 10  6 - 23 mg/dL Final  . Creatinine, Ser 04/02/2020 1.13  0.40 - 1.50 mg/dL Final  . Total Bilirubin 04/02/2020 0.4  0.2 - 1.2 mg/dL Final  . Alkaline Phosphatase 04/02/2020 74  39 - 117 U/L Final  . AST 04/02/2020 22  0 - 37 U/L Final  . ALT 04/02/2020 24  0 - 53 U/L Final  . Total Protein 04/02/2020 8.2  6.0 - 8.3 g/dL Final  . Albumin 04/02/2020 4.7  3.5 - 5.2 g/dL Final  . GFR 04/02/2020 86.85  >60.00 mL/min Final  . Calcium 04/02/2020 10.3  8.4 - 10.5 mg/dL Final  . Hgb A1c MFr Bld 04/02/2020 6.8* 4.6 - 6.5 % Final   Glycemic Control Guidelines for People with Diabetes:Non Diabetic:  <6%Goal of Therapy: <7%Additional Action Suggested:  >8%     Physical Examination:  BP 128/82 (BP Location: Left Arm, Patient Position: Sitting, Cuff Size: Large)   Pulse 79   Ht 6' (1.829 m)   Wt 278 lb 3.2 oz (126.2 kg)   SpO2 95%   BMI 37.73 kg/m       ASSESSMENT:  Diabetes type 2, with obesity  See history of present illness for detailed discussion of current diabetes management, blood sugar patterns and problems identified  His A1c is 6.8 and improved  With increase Trulicity he has fairly good control Although is not checking readings after meals likely with his A1c being below 7 he likely does not have consistent postprandial hypoglycemia Diet is also better and may be able to control better with maximum dose Trulicity However has not lost any weight  Hypertension: His blood pressure appears to be improved even without any specific treatment No microalbuminuria present  Lipids: LDL below 100 without any treatment, will recheck fasting labs in the next visit   PLAN:    More consistent glucose monitoring   Discussed need to alternate fasting and 2 hours after meals especially dinner  He will set the  time rapidly on his glucose monitor  He will continue same dose of [place] but consider reducing the dose if blood sugars come down overnight  Consider Humalog if postprandial readings are consistently over 180  Consistent exercise up to 5 days a week Recheck A1c in 3 months  Needs regular eye exams  Patient Instructions  Check blood sugars on waking up 2-3 days a week  Also check blood sugars about 2 hours after meals and do this after different meals by rotation  Recommended blood sugar levels on waking up are 90-130 and about 2 hours after meal is 130-160  Please bring your blood sugar monitor to each visit, thank you     Elayne Snare 04/05/2020, 9:01 PM   Note: This office note was prepared with Dragon voice recognition system technology. Any transcriptional errors that result from this process are unintentional.

## 2020-04-05 NOTE — Patient Instructions (Signed)
Check blood sugars on waking up 2-3 days a week  Also check blood sugars about 2 hours after meals and do this after different meals by rotation  Recommended blood sugar levels on waking up are 90-130 and about 2 hours after meal is 130-160  Please bring your blood sugar monitor to each visit, thank you   

## 2020-04-26 ENCOUNTER — Other Ambulatory Visit: Payer: Self-pay | Admitting: Endocrinology

## 2020-04-26 MED FILL — TRESIBA FLEXTOUCH 200 UNITS: 200 | 21 days supply | Qty: 9 | Fill #0

## 2020-04-29 MED FILL — TRULICITY 4.5 MG/0.5ML SOPN: 4.5 | 28 days supply | Qty: 2 | Fill #0

## 2020-05-20 ENCOUNTER — Other Ambulatory Visit: Payer: Self-pay | Admitting: Endocrinology

## 2020-05-21 MED FILL — TRESIBA FLEXTOUCH 200 UNITS: 200 | 28 days supply | Qty: 12 | Fill #0

## 2020-05-21 MED FILL — TRULICITY 4.5 MG/0.5ML SOPN: 4.5 | 28 days supply | Qty: 2 | Fill #1

## 2020-06-08 DIAGNOSIS — Z13220 Encounter for screening for lipoid disorders: Secondary | ICD-10-CM | POA: Diagnosis not present

## 2020-06-08 DIAGNOSIS — Z125 Encounter for screening for malignant neoplasm of prostate: Secondary | ICD-10-CM | POA: Diagnosis not present

## 2020-06-08 DIAGNOSIS — Z0000 Encounter for general adult medical examination without abnormal findings: Secondary | ICD-10-CM | POA: Diagnosis not present

## 2020-06-08 DIAGNOSIS — Z6836 Body mass index (BMI) 36.0-36.9, adult: Secondary | ICD-10-CM | POA: Diagnosis not present

## 2020-06-08 DIAGNOSIS — E119 Type 2 diabetes mellitus without complications: Secondary | ICD-10-CM | POA: Diagnosis not present

## 2020-06-08 DIAGNOSIS — Z79899 Other long term (current) drug therapy: Secondary | ICD-10-CM | POA: Diagnosis not present

## 2020-06-17 ENCOUNTER — Other Ambulatory Visit: Payer: Self-pay | Admitting: Endocrinology

## 2020-06-17 DIAGNOSIS — R7989 Other specified abnormal findings of blood chemistry: Secondary | ICD-10-CM | POA: Diagnosis not present

## 2020-06-17 DIAGNOSIS — R972 Elevated prostate specific antigen [PSA]: Secondary | ICD-10-CM | POA: Diagnosis not present

## 2020-06-17 DIAGNOSIS — E782 Mixed hyperlipidemia: Secondary | ICD-10-CM | POA: Diagnosis not present

## 2020-06-17 DIAGNOSIS — E119 Type 2 diabetes mellitus without complications: Secondary | ICD-10-CM | POA: Diagnosis not present

## 2020-06-17 DIAGNOSIS — Z6836 Body mass index (BMI) 36.0-36.9, adult: Secondary | ICD-10-CM | POA: Diagnosis not present

## 2020-06-17 MED FILL — TRULICITY 4.5 MG/0.5ML SOPN: 4.5 | 28 days supply | Qty: 2 | Fill #2

## 2020-06-17 MED FILL — JARDIANCE 25 MG TABLET: 25 | 90 days supply | Qty: 90 | Fill #0

## 2020-06-17 MED FILL — TRESIBA FLEXTOUCH 200 UNITS: 200 | 28 days supply | Qty: 12 | Fill #0

## 2020-07-18 MED FILL — TRESIBA FLEXTOUCH 200 UNITS: 200 | 28 days supply | Qty: 12 | Fill #1

## 2020-07-20 ENCOUNTER — Ambulatory Visit: Payer: 59 | Attending: Internal Medicine

## 2020-07-20 DIAGNOSIS — Z23 Encounter for immunization: Secondary | ICD-10-CM

## 2020-07-20 NOTE — Progress Notes (Signed)
   Covid-19 Vaccination Clinic  Name:  Roy Jennings    MRN: [id number] DOB: 1980-12-24  07/20/2020  Roy Jennings was observed post Covid-19 immunization for 15 minutes without incident. He was provided with Vaccine Information Sheet and instruction to access the V-Safe system.   Roy Jennings was instructed to call 911 with any severe reactions post vaccine: Marland Kitchen Difficulty breathing  . Swelling of face and throat  . A fast heartbeat  . A bad rash all over body  . Dizziness and weakness   Immunizations Administered    Name Date Dose VIS Date Route   Pfizer COVID-19 Vaccine 07/20/2020 10:36 AM 0.3 mL 05/22/2020 Intramuscular   Manufacturer: [employer], [employer]   Lot: [id number]   NDC: [address]-2

## 2020-07-31 DIAGNOSIS — Z20828 Contact with and (suspected) exposure to other viral communicable diseases: Secondary | ICD-10-CM | POA: Diagnosis not present

## 2020-08-05 ENCOUNTER — Other Ambulatory Visit: Payer: Self-pay | Admitting: Endocrinology

## 2020-08-05 DIAGNOSIS — Z03818 Encounter for observation for suspected exposure to other biological agents ruled out: Secondary | ICD-10-CM | POA: Diagnosis not present

## 2020-08-05 DIAGNOSIS — E1165 Type 2 diabetes mellitus with hyperglycemia: Secondary | ICD-10-CM

## 2020-08-05 DIAGNOSIS — Z794 Long term (current) use of insulin: Secondary | ICD-10-CM

## 2020-08-06 ENCOUNTER — Other Ambulatory Visit: Payer: 59

## 2020-08-08 ENCOUNTER — Other Ambulatory Visit: Payer: 59

## 2020-08-09 ENCOUNTER — Ambulatory Visit: Payer: 59 | Admitting: Endocrinology

## 2020-08-09 ENCOUNTER — Other Ambulatory Visit: Payer: Self-pay | Admitting: Endocrinology

## 2020-08-09 DIAGNOSIS — Z20822 Contact with and (suspected) exposure to covid-19: Secondary | ICD-10-CM | POA: Diagnosis not present

## 2020-08-09 MED FILL — TRESIBA FLEXTOUCH 200 UNITS: 200 | 28 days supply | Qty: 12 | Fill #2

## 2020-08-09 MED FILL — TRULICITY 4.5 MG/0.5ML SOPN: 4.5 | 28 days supply | Qty: 2 | Fill #0

## 2020-08-13 ENCOUNTER — Other Ambulatory Visit: Payer: Self-pay

## 2020-08-13 ENCOUNTER — Other Ambulatory Visit (INDEPENDENT_AMBULATORY_CARE_PROVIDER_SITE_OTHER): Payer: 59

## 2020-08-13 DIAGNOSIS — E1165 Type 2 diabetes mellitus with hyperglycemia: Secondary | ICD-10-CM

## 2020-08-13 DIAGNOSIS — Z794 Long term (current) use of insulin: Secondary | ICD-10-CM

## 2020-08-13 LAB — COMPREHENSIVE METABOLIC PANEL
ALT: 23 U/L (ref 0–53)
AST: 17 U/L (ref 0–37)
Albumin: 4.6 g/dL (ref 3.5–5.2)
Alkaline Phosphatase: 78 U/L (ref 39–117)
BUN: 14 mg/dL (ref 6–23)
CO2: 30 mEq/L (ref 19–32)
Calcium: 9.9 mg/dL (ref 8.4–10.5)
Chloride: 102 mEq/L (ref 96–112)
Creatinine, Ser: 1.18 mg/dL (ref 0.40–1.50)
GFR: 77.12 mL/min (ref >60.00)
Glucose, Bld: 114 mg/dL — ABNORMAL HIGH (ref 70–99)
Potassium: 4 mEq/L (ref 3.5–5.1)
Sodium: 138 mEq/L (ref 135–145)
Total Bilirubin: 0.5 mg/dL (ref 0.2–1.2)
Total Protein: 8.1 g/dL (ref 6.0–8.3)

## 2020-08-13 LAB — LIPID PANEL
Cholesterol: 169 mg/dL (ref 0–200)
HDL: 36.2 mg/dL — ABNORMAL LOW (ref >39.00)
NonHDL: 132.76
Total CHOL/HDL Ratio: 5
Triglycerides: 201 mg/dL — ABNORMAL HIGH (ref 0.0–149.0)
VLDL: 40.2 mg/dL — ABNORMAL HIGH (ref 0.0–40.0)

## 2020-08-13 LAB — HEMOGLOBIN A1C: Hgb A1c MFr Bld: 6.4 % (ref 4.6–6.5)

## 2020-08-13 LAB — LDL CHOLESTEROL, DIRECT: Direct LDL: 84 mg/dL

## 2020-08-14 DIAGNOSIS — Z0100 Encounter for examination of eyes and vision without abnormal findings: Secondary | ICD-10-CM | POA: Diagnosis not present

## 2020-08-14 LAB — HM DIABETES EYE EXAM

## 2020-08-16 ENCOUNTER — Ambulatory Visit: Payer: 59 | Admitting: Endocrinology

## 2020-09-11 ENCOUNTER — Other Ambulatory Visit: Payer: Self-pay | Admitting: Endocrinology

## 2020-09-12 ENCOUNTER — Other Ambulatory Visit: Payer: Self-pay | Admitting: Endocrinology

## 2020-09-12 MED FILL — TRULICITY 4.5 MG/0.5ML SOPN: 4.5 | 28 days supply | Qty: 2 | Fill #0

## 2020-09-13 ENCOUNTER — Other Ambulatory Visit: Payer: Self-pay | Admitting: Endocrinology

## 2020-09-13 ENCOUNTER — Telehealth: Payer: Self-pay | Admitting: Endocrinology

## 2020-09-13 MED FILL — TRESIBA FLEXTOUCH 200 UNITS: 200 | 28 days supply | Qty: 12 | Fill #0

## 2020-09-13 NOTE — Telephone Encounter (Signed)
ATC- left VM for him to call office and resch

## 2020-09-13 NOTE — Telephone Encounter (Signed)
-----   Message from Reather Littler, MD sent at 09/13/2020  9:12 AM EST ----- Regarding: Missed appointment Please reschedule missed appointment from last month, if coming within the next 2 weeks will not need follow-up lab

## 2020-10-11 ENCOUNTER — Other Ambulatory Visit: Payer: Self-pay | Admitting: Endocrinology

## 2020-10-11 MED FILL — TRESIBA FLEXTOUCH 200 UNITS: 200 | 28 days supply | Qty: 12 | Fill #1

## 2020-10-11 MED FILL — JARDIANCE 25 MG TABLET: 25 | 90 days supply | Qty: 90 | Fill #1

## 2020-10-24 ENCOUNTER — Telehealth: Payer: Self-pay | Admitting: Endocrinology

## 2020-10-24 ENCOUNTER — Other Ambulatory Visit: Payer: Self-pay | Admitting: Endocrinology

## 2020-10-24 MED ORDER — TRESIBA FLEXTOUCH 200 UNIT/ML ~~LOC~~ SOPN: PEN_INJECTOR | SUBCUTANEOUS | 0 refills | Status: DC

## 2020-10-24 MED ORDER — EMPAGLIFLOZIN 25 MG PO TABS: 25.0000 mg | ORAL_TABLET | Freq: Every day | ORAL | 2 refills | Status: DC

## 2020-10-24 MED ORDER — TRULICITY 4.5 MG/0.5ML ~~LOC~~ SOAJ: 4.5000 mg | SUBCUTANEOUS | 0 refills | Status: DC

## 2020-10-24 MED FILL — TRULICITY 4.5 MG/0.5ML SOPN: 4.5 | 28 days supply | Qty: 2 | Fill #0

## 2020-10-24 NOTE — Telephone Encounter (Signed)
MEDICATION: Quin Hoop, jardiance  PHARMACY:   [facility] - [place], [place] - [address] [facility] [place]. Phone:  [phone]  Fax:  [phone]      HAS THE PATIENT CONTACTED THEIR PHARMACY?  yes  IS THIS A 90 DAY SUPPLY : no  IS PATIENT OUT OF MEDICATION: yes  IF NOT; HOW MUCH IS LEFT:   LAST APPOINTMENT DATE: @3 /06/2021  NEXT APPOINTMENT DATE:@5 /13/2022  DO WE HAVE YOUR PERMISSION TO LEAVE A DETAILED MESSAGE?:  OTHER COMMENTS:    **Let patient know to contact pharmacy at the end of the day to make sure medication is ready. **  ** Please notify patient to allow 48-72 hours to process**  **Encourage patient to contact the pharmacy for refills or they can request refills through [facility]**

## 2020-10-24 NOTE — Telephone Encounter (Signed)
Refill sent.

## 2020-11-12 ENCOUNTER — Other Ambulatory Visit (HOSPITAL_COMMUNITY): Payer: Self-pay

## 2020-11-12 ENCOUNTER — Other Ambulatory Visit: Payer: Self-pay | Admitting: Endocrinology

## 2020-11-12 MED ORDER — TRULICITY 4.5 MG/0.5ML ~~LOC~~ SOAJ
SUBCUTANEOUS | 3 refills | Status: DC
  Filled 2020-11-12: qty 2, 28d supply, fill #0
  Filled 2020-11-26: qty 6, 84d supply, fill #0

## 2020-11-12 MED FILL — Insulin Degludec Soln Pen-Injector 200 Unit/ML: SUBCUTANEOUS | 28 days supply | Qty: 12 | Fill #0 | Status: AC

## 2020-11-26 ENCOUNTER — Other Ambulatory Visit (HOSPITAL_COMMUNITY): Payer: Self-pay

## 2020-12-11 ENCOUNTER — Other Ambulatory Visit (HOSPITAL_COMMUNITY): Payer: Self-pay

## 2020-12-11 ENCOUNTER — Other Ambulatory Visit: Payer: Self-pay | Admitting: Endocrinology

## 2020-12-11 MED ORDER — TRESIBA FLEXTOUCH 200 UNIT/ML ~~LOC~~ SOPN
86.0000 [IU] | PEN_INJECTOR | Freq: Every day | SUBCUTANEOUS | 3 refills | Status: DC
  Filled 2020-12-11: qty 12, 27d supply, fill #0
  Filled 2021-01-14: qty 12, 27d supply, fill #1
  Filled 2021-02-10: qty 12, 27d supply, fill #2
  Filled 2021-03-13: qty 12, 27d supply, fill #3

## 2020-12-12 ENCOUNTER — Other Ambulatory Visit: Payer: Self-pay | Admitting: Endocrinology

## 2020-12-12 DIAGNOSIS — E1165 Type 2 diabetes mellitus with hyperglycemia: Secondary | ICD-10-CM

## 2020-12-13 ENCOUNTER — Other Ambulatory Visit (INDEPENDENT_AMBULATORY_CARE_PROVIDER_SITE_OTHER): Payer: 59

## 2020-12-13 ENCOUNTER — Other Ambulatory Visit: Payer: Self-pay

## 2020-12-13 DIAGNOSIS — Z794 Long term (current) use of insulin: Secondary | ICD-10-CM

## 2020-12-13 DIAGNOSIS — E1165 Type 2 diabetes mellitus with hyperglycemia: Secondary | ICD-10-CM | POA: Diagnosis not present

## 2020-12-13 LAB — BASIC METABOLIC PANEL
BUN: 11 mg/dL (ref 6–23)
CO2: 27 mEq/L (ref 19–32)
Calcium: 9.5 mg/dL (ref 8.4–10.5)
Chloride: 103 mEq/L (ref 96–112)
Creatinine, Ser: 1.17 mg/dL (ref 0.40–1.50)
GFR: 77.73 mL/min (ref >60.00)
Glucose, Bld: 109 mg/dL — ABNORMAL HIGH (ref 70–99)
Potassium: 4 mEq/L (ref 3.5–5.1)
Sodium: 139 mEq/L (ref 135–145)

## 2020-12-13 LAB — HEMOGLOBIN A1C: Hgb A1c MFr Bld: 6.6 % — ABNORMAL HIGH (ref 4.6–6.5)

## 2020-12-15 NOTE — Progress Notes (Signed)
Patient ID: Roy Jennings, male   DOB: 20-Sep-1980, 40 y.o.   MRN: [id number]            Reason for Appointment: Follow-up for Type 2 Diabetes    History of Present Illness:          Date of diagnosis of type 2 diabetes mellitus: 12/2011       Background history:   He was initially diagnosed with marked hyperglycemia and A1c of 13.4% He was apparently started on insulin at onset and has taken various insulin regimens in the past He has had difficulty tolerating metformin and has not taken this most of the time He has probably been on Lantus since about 08/2015, previously on the 70/30 insulin His level of control had improved initially in 2014 and 2015  Recent history:   INSULIN regimen is: 86 units Tresiba in am only  Non-insulin hypoglycemic drugs the patient is taking are:  Jardiance 25 mg in am, Trulicity 4.5 weekly   Current management, blood sugar patterns and problems identified:  He has not been seen since 9/21 His A1c is slightly higher at 6.6 compared to 6.4   He is likely still benefiting from going up to 4.5 mg Trulicity since 8/98  However he has not monitored blood sugars much and has only 3 readings in the last month, mostly done around 4 AM  He says he is working variable times at day or night and does not remember to check his readings  Previously was asked to check more readings after meals  Fasting lab glucose was 109  Is concerned about lack of weight loss but has not had any low normal readings or symptoms of hypoglycemia at any time  Trying to be regular with exercise, going to the gym 3-4/7  No side effects with Jardiance or Trulicity which he takes regularly         [place] usually at 7-8 pm   Side effects from medications have been: Diarrhea from most doses of metformin  Compliance with the medical regimen: Inconsistent  Hypoglycemia:   none  Glucose monitoring:  done 0-1 times a day         Glucometer: ?  Accu-Chek   Blood Glucose  readings from download show only 3 readings range 116-135  Previously:  AVERAGE 129, range 117-145   Self-care: The diet that the patient has been following is: None   Typical meal intake: Variable        Dietician visit, most recent: 08/2015, diabetes educator in 08/2018                Weight history:   Wt Readings from Last 3 Encounters:  12/16/20 277 lb 9.6 oz (125.9 kg)  04/05/20 278 lb 3.2 oz (126.2 kg)  12/29/19 278 lb (126.1 kg)    Glycemic control:   Lab Results  Component Value Date   HGBA1C 6.6 (H) 12/13/2020   HGBA1C 6.4 08/13/2020   HGBA1C 6.8 (H) 04/02/2020   Lab Results  Component Value Date   MICROALBUR 1.1 04/02/2020   LDLCALC 83 04/28/2017   CREATININE 1.17 12/13/2020   Lab Results  Component Value Date   MICRALBCREAT 0.9 04/02/2020    Lab Results  Component Value Date   FRUCTOSAMINE 283 08/31/2018   FRUCTOSAMINE 292 (H) 03/23/2018   FRUCTOSAMINE 275 (H) 11/05/2017      Allergies as of 12/16/2020      Reactions   Metformin And Related    Diarrhea, nausea  Medication List       Accurate as of Dec 16, 2020  4:17 PM. If you have any questions, ask your nurse or doctor.        FreeStyle Freedom Lite w/Device Kit Use as instructed to check blood sugar twice daily.   freestyle lancets 1 each by Other route 2 (two) times daily. for testing   glucose blood test strip Commonly known as: FREESTYLE LITE Use as instructed to check blood sugar twice daily.   Insulin Pen Needle 31G X 8 MM Misc Commonly known as: Unifine Pentips USE TO INJECT INSULIN 1-3 TIMES INTO SKIN DAILY   Jardiance 25 MG Tabs tablet Generic drug: empagliflozin TAKE 1 TABLET BY MOUTH ONCE A DAY   ondansetron 4 MG tablet Commonly known as: Zofran Take 1 tablet (4 mg total) by mouth every 8 (eight) hours as needed for nausea or vomiting.   [name] FlexTouch 200 UNIT/ML FlexTouch Pen Generic drug: insulin degludec Inject 86 Units into the skin daily.    Trulicity 4.5 BS/9.6GE Sopn Generic drug: Dulaglutide INJECT 4.5 MG AS DIRECTED ONCE A WEEK.       Allergies:  Allergies  Allergen Reactions  . Metformin And Related     Diarrhea, nausea    Past Medical History:  Diagnosis Date  . Diabetes mellitus 12/30/11  . DKA (diabetic ketoacidoses) 12/30/11   new dx DM a1c 13  . PVC (premature ventricular contraction)    LBB inferior axis QRS // followed by [name]  . Right bundle branch block     Past Surgical History:  Procedure Laterality Date  . KNEE ARTHROSCOPY  1999-2000   bilateral    Family History  Problem Relation Age of Onset  . Diabetes Father   . Diabetes Maternal Grandmother   . Hypertension Mother   . CAD Neg Hx     Social History:  reports that he has never smoked. He has never used smokeless tobacco. He reports current alcohol use. He reports that he does not use drugs.   Review of Systems   Lipid history: His last LDL was fairly good but triglycerides are around 200 Not on a statin drug, last was on pravastatin in 2020   Lab Results  Component Value Date   CHOL 169 08/13/2020   HDL 36.20 (L) 08/13/2020   LDLCALC 83 04/28/2017   LDLDIRECT 84.0 08/13/2020   TRIG 201.0 (H) 08/13/2020   CHOLHDL 5 08/13/2020           Blood pressure: Not on medications  He is on Jardiance 25 mg only    BP Readings from Last 3 Encounters:  12/16/20 126/84  04/05/20 128/82  03/10/20 133/87    Most recent eye exam was 2022 but report not available  Most recent foot exam: 11/2017    LABS:  Lab on 12/13/2020  Component Date Value Ref Range Status  . Sodium 12/13/2020 139  135 - 145 mEq/L Final  . Potassium 12/13/2020 4.0  3.5 - 5.1 mEq/L Final  . Chloride 12/13/2020 103  96 - 112 mEq/L Final  . CO2 12/13/2020 27  19 - 32 mEq/L Final  . Glucose, Bld 12/13/2020 109* 70 - 99 mg/dL Final  . BUN 12/13/2020 11  6 - 23 mg/dL Final  . Creatinine, Ser 12/13/2020 1.17  0.40 - 1.50 mg/dL Final  . GFR  12/13/2020 77.73  >60.00 mL/min Final   Calculated using the CKD-EPI Creatinine Equation (2021)  . Calcium 12/13/2020 9.5  8.4 - 10.5  mg/dL Final  . Hgb A1c MFr Bld 12/13/2020 6.6* 4.6 - 6.5 % Final   Glycemic Control Guidelines for People with Diabetes:Non Diabetic:  <6%Goal of Therapy: <7%Additional Action Suggested:  >8%     Physical Examination:  BP 126/84   Pulse 94   Ht 6' (1.829 m)   Wt 277 lb 9.6 oz (125.9 kg)   SpO2 96%   BMI 37.65 kg/m       ASSESSMENT:  Diabetes type 2, with obesity  See history of present illness for detailed discussion of current diabetes management, blood sugar patterns and problems identified  His A1c is 6.6 and generally about the same  He has done minimal glucose monitoring at home With 86 units of basal insulin he appears to have overall fairly good readings but not clear if he may have any postprandial rise in blood sugar at times Also not hypoglycemic at any time by symptoms Has difficulty losing weight despite doing some exercise    PLAN:    Needs to do consistent glucose monitoring at least 3 to 4 days a week  Discussed need to do some readings fasting and 2 hours after meals especially his main meal of the day in the evening  Discussed blood sugar targets  He will continue same dose of [place] but consider reducing the dose if blood sugars come down overnight  To call if blood sugars are unusually high or low  Follow-up in 4 months  Patient Instructions  Check sugars 2 hrs after any meal   Elayne Snare 12/16/2020, 4:17 PM   Note: This office note was prepared with Dragon voice recognition system technology. Any transcriptional errors that result from this process are unintentional.

## 2020-12-16 ENCOUNTER — Ambulatory Visit (INDEPENDENT_AMBULATORY_CARE_PROVIDER_SITE_OTHER): Payer: 59 | Admitting: Endocrinology

## 2020-12-16 ENCOUNTER — Other Ambulatory Visit: Payer: Self-pay

## 2020-12-16 ENCOUNTER — Other Ambulatory Visit (HOSPITAL_COMMUNITY): Payer: Self-pay

## 2020-12-16 ENCOUNTER — Encounter: Payer: Self-pay | Admitting: Endocrinology

## 2020-12-16 VITALS — BP 126/84 | HR 94 | Ht 72.0 in | Wt 277.6 lb

## 2020-12-16 DIAGNOSIS — E669 Obesity, unspecified: Secondary | ICD-10-CM

## 2020-12-16 DIAGNOSIS — E1169 Type 2 diabetes mellitus with other specified complication: Secondary | ICD-10-CM | POA: Diagnosis not present

## 2020-12-16 NOTE — Patient Instructions (Addendum)
Check sugars 2 hrs after any meal

## 2020-12-19 ENCOUNTER — Other Ambulatory Visit: Payer: Self-pay

## 2020-12-19 ENCOUNTER — Encounter (HOSPITAL_BASED_OUTPATIENT_CLINIC_OR_DEPARTMENT_OTHER): Payer: Self-pay | Admitting: Family Medicine

## 2020-12-19 ENCOUNTER — Ambulatory Visit (INDEPENDENT_AMBULATORY_CARE_PROVIDER_SITE_OTHER): Payer: 59 | Admitting: Family Medicine

## 2020-12-19 VITALS — BP 132/90 | HR 84 | Ht 72.0 in | Wt 276.2 lb

## 2020-12-19 DIAGNOSIS — E119 Type 2 diabetes mellitus without complications: Secondary | ICD-10-CM

## 2020-12-19 DIAGNOSIS — E669 Obesity, unspecified: Secondary | ICD-10-CM | POA: Diagnosis not present

## 2020-12-19 DIAGNOSIS — Z794 Long term (current) use of insulin: Secondary | ICD-10-CM

## 2020-12-19 NOTE — Patient Instructions (Signed)
  Medication Instructions:  Your physician recommends that you continue on your current medications as directed. Please refer to the Current Medication list given to you today. --If you need a refill on any your medications before your next appointment, please call your pharmacy first. If no refills are authorized on file call the office.--  Follow-Up: Your next appointment:   Your physician recommends that you schedule a follow-up appointment in: 6 MONTHS with Dr. de Cuba  Thanks for letting us be apart of your health journey!!  Primary Care and Sports Medicine   Dr. de Cuba and Sarabeth Early, DNP, AGNP  We recommend signing up for the patient portal called "MyChart".  Sign up information is provided on this After Visit Summary.  MyChart is used to connect with patients for Virtual Visits (Telemedicine).  Patients are able to view lab/test results, encounter notes, upcoming appointments, etc.  Non-urgent messages can be sent to your provider as well.   To learn more about what you can do with MyChart, please visit --  [email or web address].     

## 2020-12-19 NOTE — Assessment & Plan Note (Signed)
Follows with endocrinology Reviewed current medications, list is up-to-date Will have labs completed with endocrinology in about 4 months Does have high screening done regularly, goes to [facility] on [address] Reports completing foot exam with endocrinologist Will need to consider risk/benefit of initiating statin

## 2020-12-19 NOTE — Assessment & Plan Note (Signed)
Discussed lifestyle modifications the patient, particularly dietary changes and physical activity Patient currently engaging in regular walking regimen, also goes to [employer] which he has been doing for about a year Encouraged to continue with his exercise regimen, consider short periods of walking after meals as able Monitor weight at future visits

## 2020-12-19 NOTE — Progress Notes (Signed)
New Patient Office Visit  Subjective:  Patient ID: Roy Jennings, male    DOB: [birth year]  Age: 40 y.o. MRN: [id number]  CC:  Chief Complaint  Patient presents with  . Establish Care    Transfer care from [place] [facility] [place] is a 40 year old male presenting to establish in clinic.  He denies any current concerns today.  Past medical history significant for diabetes, dyslipidemia.  Diabetes: Patient follows with endocrinologist who he sees about every 4 months.  Currently managed on Tresiba, Jardiance, Trulicity.  Reports that he has eye exams completed at [facility] on [facility].  Denies any symptoms of hypoglycemia, no readings of hypoglycemia.  Saw endocrinology just a few days ago.  Next appoint with them is in 4 months.  Past Medical History:  Diagnosis Date  . Diabetes mellitus 12/30/11  . DKA (diabetic ketoacidoses) 12/30/11   new dx DM a1c 13  . PVC (premature ventricular contraction)    LBB inferior axis QRS // followed by [name]  . Right bundle branch block     Past Surgical History:  Procedure Laterality Date  . KNEE ARTHROSCOPY  1999-2000   bilateral    Family History  Problem Relation Age of Onset  . Diabetes Father   . Diabetes Maternal Grandmother   . Hypertension Mother   . CAD Neg Hx     Social History   Socioeconomic History  . Marital status: Married    Spouse name: Not on file  . Number of children: 1  . Years of education: BS  . Highest education level: Not on file  Occupational History    Employer: [employer]    Comment: Counselor at [employer]  Tobacco Use  . Smoking status: Never Smoker  . Smokeless tobacco: Never Used  Vaping Use  . Vaping Use: Never used  Substance and Sexual Activity  . Alcohol use: Yes    Alcohol/week: 0.0 standard drinks    Comment: rarely  . Drug use: No  . Sexual activity: Yes  Other Topics Concern  . Not on file  Social History Narrative    Married. Son '08. 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   Went to [employer], played football one year      Hobbies: formerly working [employer], sports, reading   Social Determinants of Radio broadcast assistant Strain: Not on [employer] Insecurity: Not on file  Transportation Needs: Not on file  Physical Activity: Not on file  Stress: Not on file  Social Connections: Not on file  Intimate Partner Violence: Not on file    Objective:   Today's Vitals: BP 132/90   Pulse 84   Ht 6' (1.829 m)   Wt 276 lb 3.2 oz (125.3 kg)   SpO2 97%   BMI 37.46 kg/m   Physical Exam  Pleasant 40 year old male in no acute distress Cardiovascular exam with regular rate and rhythm, no murmurs appreciated Lungs clear to auscultation bilaterally  Assessment & Plan:   Problem List Items Addressed This Visit      Endocrine   Diabetes ([place]) - Primary    Follows with endocrinology Reviewed current medications, list is up-to-date Will have labs completed with endocrinology in about 4 months Does have high screening done regularly, goes to [facility] [address] Reports completing foot exam with endocrinologist Will need to consider risk/benefit of initiating statin  Relevant Orders   CBC with Differential/Platelet     Other   Obesity, Class II, BMI 35-39.9    Discussed lifestyle modifications the patient, particularly dietary changes and physical activity Patient currently engaging in regular walking regimen, also goes to [employer] which he has been doing for about a year Encouraged to continue with his exercise regimen, consider short periods of walking after meals as able Monitor weight at future visits      Relevant Orders   CBC with Differential/Platelet      Outpatient Encounter Medications as of 12/19/2020  Medication Sig  . Blood Glucose Monitoring Suppl (FREESTYLE FREEDOM LITE) w/Device KIT Use as instructed to check blood sugar twice  daily.  . Dulaglutide (TRULICITY) 4.5 TI/1.4ER SOPN INJECT 4.5 MG AS DIRECTED ONCE A WEEK.  . empagliflozin (JARDIANCE) 25 MG TABS tablet TAKE 1 TABLET BY MOUTH ONCE A DAY  . glucose blood (FREESTYLE LITE) test strip Use as instructed to check blood sugar twice daily.  . insulin degludec (TRESIBA FLEXTOUCH) 200 UNIT/ML FlexTouch Pen Inject 86 Units into the skin daily.  . Insulin Pen Needle (UNIFINE PENTIPS) 31G X 8 MM MISC USE TO INJECT INSULIN 1-3 TIMES INTO SKIN DAILY  . Lancets (FREESTYLE) lancets 1 each by Other route 2 (two) times daily. for testing  . ondansetron (ZOFRAN) 4 MG tablet Take 1 tablet (4 mg total) by mouth every 8 (eight) hours as needed for nausea or vomiting.   No facility-administered encounter medications on file as of 12/19/2020.    Follow-up: Return in about 6 months (around 06/21/2021) for Follow Up CPE.   Tommy Minichiello J [place], MD

## 2020-12-23 ENCOUNTER — Encounter (HOSPITAL_BASED_OUTPATIENT_CLINIC_OR_DEPARTMENT_OTHER): Payer: Self-pay | Admitting: Family Medicine

## 2020-12-24 ENCOUNTER — Encounter: Payer: Self-pay | Admitting: Endocrinology

## 2021-01-10 ENCOUNTER — Other Ambulatory Visit: Payer: Self-pay | Admitting: Endocrinology

## 2021-01-10 ENCOUNTER — Other Ambulatory Visit (HOSPITAL_COMMUNITY): Payer: Self-pay

## 2021-01-10 MED ORDER — TRULICITY 4.5 MG/0.5ML ~~LOC~~ SOAJ
4.5000 mg | SUBCUTANEOUS | 3 refills | Status: DC
  Filled 2021-01-10: qty 2, fill #0
  Filled 2021-01-14: qty 2, 28d supply, fill #0
  Filled 2021-02-10: qty 6, 84d supply, fill #0
  Filled 2021-05-07: qty 2, 28d supply, fill #1

## 2021-01-14 ENCOUNTER — Other Ambulatory Visit (HOSPITAL_COMMUNITY): Payer: Self-pay

## 2021-02-10 ENCOUNTER — Other Ambulatory Visit (HOSPITAL_COMMUNITY): Payer: Self-pay

## 2021-02-10 MED FILL — Empagliflozin Tab 25 MG: ORAL | 30 days supply | Qty: 30 | Fill #0 | Status: AC

## 2021-03-13 ENCOUNTER — Other Ambulatory Visit (HOSPITAL_COMMUNITY): Payer: Self-pay

## 2021-03-21 ENCOUNTER — Emergency Department (HOSPITAL_BASED_OUTPATIENT_CLINIC_OR_DEPARTMENT_OTHER): Payer: 59 | Admitting: Radiology

## 2021-03-21 ENCOUNTER — Emergency Department (HOSPITAL_BASED_OUTPATIENT_CLINIC_OR_DEPARTMENT_OTHER)
Admission: EM | Admit: 2021-03-21 | Discharge: 2021-03-21 | Disposition: A | Discharge status: Home / Self Care | Payer: 59 | Attending: Emergency Medicine | Admitting: Emergency Medicine

## 2021-03-21 ENCOUNTER — Other Ambulatory Visit: Payer: Self-pay

## 2021-03-21 ENCOUNTER — Encounter (HOSPITAL_BASED_OUTPATIENT_CLINIC_OR_DEPARTMENT_OTHER): Payer: Self-pay | Admitting: Emergency Medicine

## 2021-03-21 DIAGNOSIS — Z7984 Long term (current) use of oral hypoglycemic drugs: Secondary | ICD-10-CM | POA: Diagnosis not present

## 2021-03-21 DIAGNOSIS — E119 Type 2 diabetes mellitus without complications: Secondary | ICD-10-CM | POA: Insufficient documentation

## 2021-03-21 DIAGNOSIS — S99921A Unspecified injury of right foot, initial encounter: Secondary | ICD-10-CM | POA: Diagnosis present

## 2021-03-21 DIAGNOSIS — W228XXA Striking against or struck by other objects, initial encounter: Secondary | ICD-10-CM | POA: Diagnosis not present

## 2021-03-21 DIAGNOSIS — S90931A Unspecified superficial injury of right great toe, initial encounter: Secondary | ICD-10-CM | POA: Diagnosis not present

## 2021-03-21 DIAGNOSIS — S90111A Contusion of right great toe without damage to nail, initial encounter: Secondary | ICD-10-CM | POA: Insufficient documentation

## 2021-03-21 DIAGNOSIS — Z8639 Personal history of other endocrine, nutritional and metabolic disease: Secondary | ICD-10-CM

## 2021-03-21 DIAGNOSIS — Z794 Long term (current) use of insulin: Secondary | ICD-10-CM | POA: Insufficient documentation

## 2021-03-21 LAB — CBG MONITORING, ED: Glucose-Capillary: 125 mg/dL — ABNORMAL HIGH (ref 70–99)

## 2021-03-21 MED ORDER — BUPIVACAINE HCL (PF) 0.5 % IJ SOLN
10.0000 mL | Freq: Once | INTRAMUSCULAR | Status: AC
  Administered 2021-03-21: 10 mL
  Filled 2021-03-21: qty 10

## 2021-03-21 NOTE — ED Notes (Signed)
Dr Haviland in room w/pt now. 

## 2021-03-21 NOTE — ED Provider Notes (Signed)
[facility] EMERGENCY DEPT Provider Note   CSN: [id number] Arrival date & time: 03/21/21  1740     History Chief Complaint  Patient presents with   Toe Injury    Roy Jennings is a 40 y.o. male.  Pt presents to the ED today with an injury to his right first toe.  Pt said he hit his right first toe a few weeks ago and then noticed a blackened blister.  Pt said he has no pain.  He was not bothered by it.  His parents saw it last night and told him to come in and get it checked.  Pt is diabetic, but said his blood sugars have been ok.      Past Medical History:  Diagnosis Date   Diabetes mellitus 12/30/11   DKA (diabetic ketoacidoses) 12/30/11   new dx DM a1c 13   PVC (premature ventricular contraction)    LBB inferior axis QRS // followed by [name]   Right bundle branch block     Patient Active Problem List   Diagnosis Date Noted   Elevated LDL cholesterol level 04/28/2017   Personal history of noncompliance with medical treatment, presenting hazards to health 07/16/2016   GAD (generalized anxiety disorder) 02/07/2016   Diabetes ([place]) 09/02/2015   Elevated serum creatinine 08/14/2015   Uncontrolled diabetes mellitus type 2 without complications 81/44/8185   Obesity, Class II, BMI 35-39.9 08/14/2015   Rectal pain 10/03/2014   Hypercholesteremia 01/05/2012   PVC (premature ventricular contraction) 01/07/2011    Past Surgical History:  Procedure Laterality Date   KNEE ARTHROSCOPY  1999-2000   bilateral       Family History  Problem Relation Age of Onset   Diabetes Father    Diabetes Maternal Grandmother    Hypertension Mother    CAD Neg Hx     Social History   Tobacco Use   Smoking status: Never   Smokeless tobacco: Never  Vaping Use   Vaping Use: Never used  Substance Use Topics   Alcohol use: Yes    Alcohol/week: 0.0 standard drinks    Comment: rarely   Drug use: No    Home Medications Prior to Admission medications    Medication Sig Start Date End Date Taking? Authorizing Provider  Blood Glucose Monitoring Suppl (FREESTYLE FREEDOM LITE) w/Device KIT Use as instructed to check blood sugar twice daily. 12/14/18   Elayne Snare, MD  Dulaglutide (TRULICITY) 4.5 UD/1.4HF SOPN Inject 4.5 mg into the skin once a week. 01/10/21   Elayne Snare, MD  empagliflozin (JARDIANCE) 25 MG TABS tablet TAKE 1 TABLET BY MOUTH ONCE A DAY 10/24/20 10/24/21  Elayne Snare, MD  glucose blood (FREESTYLE LITE) test strip Use as instructed to check blood sugar twice daily. 12/14/18   Elayne Snare, MD  insulin degludec (TRESIBA FLEXTOUCH) 200 UNIT/ML FlexTouch Pen Inject 86 Units into the skin daily. 12/11/20   Elayne Snare, MD  Insulin Pen Needle (UNIFINE PENTIPS) 31G X 8 MM MISC USE TO INJECT INSULIN 1-3 TIMES INTO SKIN DAILY 02/19/18   Elayne Snare, MD  Lancets (FREESTYLE) lancets 1 each by Other route 2 (two) times daily. for testing 12/14/18   Elayne Snare, MD  ondansetron (ZOFRAN) 4 MG tablet Take 1 tablet (4 mg total) by mouth every 8 (eight) hours as needed for nausea or vomiting. 03/10/20   Zigmund Gottron, NP    Allergies    Metformin and related  Review of Systems   Review of Systems  Musculoskeletal:  Right great toe injury  All other systems reviewed and are negative.  Physical Exam Updated Vital Signs BP (!) 128/91 (BP Location: Left Arm)   Pulse 80   Temp 98.7 F (37.1 C) (Oral)   Resp 14   Ht 5' 11"  (1.803 m)   Wt 122.5 kg   SpO2 98%   BMI 37.66 kg/m   Physical Exam Vitals and nursing note reviewed.  Constitutional:      Appearance: Normal appearance.  HENT:     Head: Normocephalic and atraumatic.     Right Ear: External ear normal.     Left Ear: External ear normal.     Nose: Nose normal.     Mouth/Throat:     Mouth: Mucous membranes are moist.     Pharynx: Oropharynx is clear.  Eyes:     Extraocular Movements: Extraocular movements intact.     Conjunctiva/sclera: Conjunctivae normal.     Pupils: Pupils  are equal, round, and reactive to light.  Cardiovascular:     Rate and Rhythm: Normal rate and regular rhythm.     Pulses: Normal pulses.     Heart sounds: Normal heart sounds.  Pulmonary:     Effort: Pulmonary effort is normal.     Breath sounds: Normal breath sounds.  Abdominal:     General: Abdomen is flat. Bowel sounds are normal.     Palpations: Abdomen is soft.  Musculoskeletal:        General: Normal range of motion.     Cervical back: Normal range of motion and neck supple.     Comments: Blackened blister to distal right toe.  See picture.  Skin:    General: Skin is warm.     Capillary Refill: Capillary refill takes less than 2 seconds.  Neurological:     General: No focal deficit present.     Mental Status: He is alert and oriented to person, place, and time.  Psychiatric:        Mood and Affect: Mood normal.        Behavior: Behavior normal.     ED Results / Procedures / Treatments   Labs (all labs ordered are listed, but only abnormal results are displayed) Labs Reviewed  CBG MONITORING, ED - Abnormal; Notable for the following components:      Result Value   Glucose-Capillary 125 (*)    All other components within normal limits    EKG None  Radiology DG Toe Great Right  Result Date: 03/21/2021 CLINICAL DATA:  First toe injury a week ago with blister. EXAM: RIGHT GREAT TOE COMPARISON:  None. FINDINGS: No evidence of osteomyelitis or foreign body. Interphalangeal degenerative spurring. No acute fracture or dislocation. IMPRESSION: No acute finding. Electronically Signed   By: Monte Fantasia M.D.   On: 03/21/2021 07:42    Procedures .Marland KitchenIncision and Drainage  Date/Time: 03/21/2021 7:46 AM Performed by: Isla Pence, MD Authorized by: Isla Pence, MD   Consent:    Consent obtained:  Verbal   Consent given by:  Patient Universal protocol:    Procedure explained and questions answered to patient or proxy's satisfaction: yes     Patient identity  confirmed:  Verbally with patient Location:    Type:  Hematoma   Location:  Lower extremity   Lower extremity location:  Toe   Toe location:  R big toe Pre-procedure details:    Skin preparation:  Povidone-iodine Sedation:    Sedation type:  None Anesthesia:    Anesthesia method:  Nerve block   Block location:  Digital   Block needle gauge:  27 G   Block anesthetic:  Bupivacaine 0.5% w/o epi Procedure type:    Complexity:  Simple Procedure details:    Ultrasound guidance: no     Incision types:  Single straight   Drainage amount:  Scant   Packing materials:  None Post-procedure details:    Procedure completion:  Tolerated well, no immediate complications Comments:     Clotted blood removed   Medications Ordered in ED Medications  bupivacaine (MARCAINE) 0.5 % injection 10 mL (10 mLs Infiltration Given 03/21/21 0740)    ED Course  I have reviewed the triage vital signs and the nursing notes.  Pertinent labs & imaging results that were available during my care of the patient were reviewed by me and considered in my medical decision making (see chart for details).    MDM Rules/Calculators/A&P                           Pt had a blood blister.  Clotted blood removed from the blister.  Dressing applied.  Pt is stable for d/c.  Return if worse. Final Clinical Impression(s) / ED Diagnoses Final diagnoses:  Contusion of right great toe without damage to nail, initial encounter  History of diabetes mellitus    Rx / DC Orders ED Discharge Orders     None        Isla Pence, MD 03/21/21 [phone]

## 2021-03-21 NOTE — Discharge Instructions (Addendum)
Return for increased redness/swelling.

## 2021-03-21 NOTE — ED Triage Notes (Signed)
Pt stubbed right 1st toe on a curb "a few weeks ago"  blackened blister noted to distal right 1st toe.  Pt denies pain or discomfort with walking.  H/o diabetes and pt is concerned about the appearance of the blister.

## 2021-03-24 ENCOUNTER — Other Ambulatory Visit (HOSPITAL_COMMUNITY): Payer: Self-pay

## 2021-03-24 MED FILL — Empagliflozin Tab 25 MG: ORAL | 30 days supply | Qty: 30 | Fill #1 | Status: AC

## 2021-03-25 ENCOUNTER — Other Ambulatory Visit (HOSPITAL_COMMUNITY): Payer: Self-pay

## 2021-04-11 ENCOUNTER — Other Ambulatory Visit: Payer: Self-pay | Admitting: Endocrinology

## 2021-04-11 ENCOUNTER — Other Ambulatory Visit (HOSPITAL_COMMUNITY): Payer: Self-pay

## 2021-04-11 MED ORDER — TRESIBA FLEXTOUCH 200 UNIT/ML ~~LOC~~ SOPN
86.0000 [IU] | PEN_INJECTOR | Freq: Every day | SUBCUTANEOUS | 3 refills | Status: DC
  Filled 2021-04-11: qty 12, 27d supply, fill #0
  Filled 2021-05-12: qty 12, 27d supply, fill #1
  Filled 2021-06-09: qty 12, 27d supply, fill #2
  Filled 2021-07-09: qty 12, 27d supply, fill #3

## 2021-04-16 ENCOUNTER — Other Ambulatory Visit: Payer: Self-pay

## 2021-04-16 ENCOUNTER — Other Ambulatory Visit (INDEPENDENT_AMBULATORY_CARE_PROVIDER_SITE_OTHER): Payer: 59

## 2021-04-16 DIAGNOSIS — Z794 Long term (current) use of insulin: Secondary | ICD-10-CM

## 2021-04-16 DIAGNOSIS — E669 Obesity, unspecified: Secondary | ICD-10-CM | POA: Diagnosis not present

## 2021-04-16 DIAGNOSIS — E1169 Type 2 diabetes mellitus with other specified complication: Secondary | ICD-10-CM | POA: Diagnosis not present

## 2021-04-16 DIAGNOSIS — E119 Type 2 diabetes mellitus without complications: Secondary | ICD-10-CM

## 2021-04-16 LAB — COMPREHENSIVE METABOLIC PANEL
ALT: 24 U/L (ref 0–53)
AST: 19 U/L (ref 0–37)
Albumin: 4.4 g/dL (ref 3.5–5.2)
Alkaline Phosphatase: 68 U/L (ref 39–117)
BUN: 14 mg/dL (ref 6–23)
CO2: 28 mEq/L (ref 19–32)
Calcium: 9.9 mg/dL (ref 8.4–10.5)
Chloride: 103 mEq/L (ref 96–112)
Creatinine, Ser: 1.22 mg/dL (ref 0.40–1.50)
GFR: 73.75 mL/min (ref >60.00)
Glucose, Bld: 105 mg/dL — ABNORMAL HIGH (ref 70–99)
Potassium: 4.1 mEq/L (ref 3.5–5.1)
Sodium: 139 mEq/L (ref 135–145)
Total Bilirubin: 0.5 mg/dL (ref 0.2–1.2)
Total Protein: 8 g/dL (ref 6.0–8.3)

## 2021-04-16 LAB — LIPID PANEL
Cholesterol: 199 mg/dL (ref 0–200)
HDL: 35.5 mg/dL — ABNORMAL LOW (ref >39.00)
NonHDL: 163.13
Total CHOL/HDL Ratio: 6
Triglycerides: 255 mg/dL — ABNORMAL HIGH (ref 0.0–149.0)
VLDL: 51 mg/dL — ABNORMAL HIGH (ref 0.0–40.0)

## 2021-04-16 LAB — MICROALBUMIN / CREATININE URINE RATIO
Creatinine,U: 113.1 mg/dL
Microalb Creat Ratio: 0.6 mg/g (ref 0.0–30.0)
Microalb, Ur: <0.7 mg/dL (ref 0.0–1.9)

## 2021-04-16 LAB — HEMOGLOBIN A1C: Hgb A1c MFr Bld: 7 % — ABNORMAL HIGH (ref 4.6–6.5)

## 2021-04-16 LAB — LDL CHOLESTEROL, DIRECT: Direct LDL: 101 mg/dL

## 2021-04-21 ENCOUNTER — Other Ambulatory Visit: Payer: 59

## 2021-04-24 ENCOUNTER — Encounter: Payer: Self-pay | Admitting: Endocrinology

## 2021-04-24 ENCOUNTER — Other Ambulatory Visit (HOSPITAL_COMMUNITY): Payer: Self-pay

## 2021-04-24 ENCOUNTER — Ambulatory Visit (INDEPENDENT_AMBULATORY_CARE_PROVIDER_SITE_OTHER): Payer: 59 | Admitting: Endocrinology

## 2021-04-24 ENCOUNTER — Other Ambulatory Visit: Payer: Self-pay

## 2021-04-24 VITALS — BP 142/90 | HR 95 | Ht 72.0 in | Wt 278.0 lb

## 2021-04-24 DIAGNOSIS — E1165 Type 2 diabetes mellitus with hyperglycemia: Secondary | ICD-10-CM

## 2021-04-24 DIAGNOSIS — Z794 Long term (current) use of insulin: Secondary | ICD-10-CM | POA: Diagnosis not present

## 2021-04-24 DIAGNOSIS — E782 Mixed hyperlipidemia: Secondary | ICD-10-CM

## 2021-04-24 MED ORDER — SIMVASTATIN 20 MG PO TABS
20.0000 mg | ORAL_TABLET | Freq: Every day | ORAL | 3 refills | Status: DC
  Filled 2021-04-24: qty 30, 30d supply, fill #0
  Filled 2021-06-09: qty 30, 30d supply, fill #1
  Filled 2022-01-07: qty 30, 30d supply, fill #2

## 2021-04-24 NOTE — Patient Instructions (Addendum)
Check blood sugars on waking up 3-4 days a week  Also check blood sugars about 2 hours after meals and do this after different meals by rotation  Recommended blood sugar levels on waking up are 90-120 and about 2 hours after meal is 130-160  Please bring your blood sugar monitor to each visit, thank you  Low fat snack

## 2021-04-24 NOTE — Progress Notes (Signed)
Patient ID: Roy Jennings, male   DOB: 1981/03/08, 40 y.o.   MRN: [id number]            Reason for Appointment: Follow-up for Type 2 Diabetes    History of Present Illness:          Date of diagnosis of type 2 diabetes mellitus: 12/2011       Background history:   He was initially diagnosed with marked hyperglycemia and A1c of 13.4% He was apparently started on insulin at onset and has taken various insulin regimens in the past He has had difficulty tolerating metformin and has not taken this most of the time He has probably been on Lantus since about 08/2015, previously on the 70/30 insulin His level of control had improved initially in 2014 and 2015  Recent history:   INSULIN regimen is: 86 units Tresiba in am   Non-insulin hypoglycemic drugs the patient is taking are:  Jardiance 25 mg in am, Trulicity 4.5 weekly   Current management, blood sugar patterns and problems identified:  His A1c is gradually getting higher at 7 compared to 6.4 in January  He is very rarely checking his blood sugars and none in the last month or so Also is not doing any formal exercise at the gym but mostly doing walking Since he has a fasting glucose of 105 in the lab his A1c is slightly higher from some postprandial increase He feels that he is probably getting more snacks However no hypoglycemic symptoms at No side effects with Jardiance or Trulicity No nausea from the 4.5 mg Trulicity dose        Dinner usually at 7-8 pm   Side effects from medications have been: Diarrhea from most doses of metformin  Compliance with the medical regimen: Inconsistent  Hypoglycemia:   none  Glucose monitoring:  done 0-1 times a day         Glucometer: ?  Accu-Chek   Blood Glucose readings: Not available   Self-care: The diet that the patient has been following is: None   Typical meal intake: Variable        Dietician visit, most recent: 08/2015, diabetes educator in 08/2018                Weight  history:   Wt Readings from Last 3 Encounters:  04/24/21 278 lb (126.1 kg)  03/21/21 270 lb (122.5 kg)  12/19/20 276 lb 3.2 oz (125.3 kg)    Glycemic control:   Lab Results  Component Value Date   HGBA1C 7.0 (H) 04/16/2021   HGBA1C 6.6 (H) 12/13/2020   HGBA1C 6.4 08/13/2020   Lab Results  Component Value Date   MICROALBUR <0.7 04/16/2021   LDLCALC 83 04/28/2017   CREATININE 1.22 04/16/2021   Lab Results  Component Value Date   MICRALBCREAT 0.6 04/16/2021    Lab Results  Component Value Date   FRUCTOSAMINE 283 08/31/2018   FRUCTOSAMINE 292 (H) 03/23/2018   FRUCTOSAMINE 275 (H) 11/05/2017      Allergies as of 04/24/2021       Reactions   Metformin And Related    Diarrhea, nausea        Medication List        Accurate as of April 24, 2021 11:59 PM. If you have any questions, ask your nurse or doctor.          STOP taking these medications    ondansetron 4 MG tablet Commonly known as: Zofran Stopped by: Vicenta Aly  Dwyane Dee, MD       TAKE these medications    FreeStyle Freedom Lite w/Device Kit Use as instructed to check blood sugar twice daily.   freestyle lancets 1 each by Other route 2 (two) times daily. for testing   glucose blood test strip Commonly known as: FREESTYLE LITE Use as instructed to check blood sugar twice daily.   Insulin Pen Needle 31G X 8 MM Misc Commonly known as: Unifine Pentips USE TO INJECT INSULIN 1-3 TIMES INTO SKIN DAILY   Jardiance 25 MG Tabs tablet Generic drug: empagliflozin TAKE 1 TABLET BY MOUTH ONCE A DAY   simvastatin 20 MG tablet Commonly known as: ZOCOR Take 1 tablet (20 mg total) by mouth at bedtime. Started by: Elayne Snare, MD   [name] FlexTouch 200 UNIT/ML FlexTouch Pen Generic drug: insulin degludec Inject 86 Units into the skin daily.   Trulicity 4.5 XT/0.2IO Sopn Generic drug: Dulaglutide Inject 4.5 mg into the skin once a week.        Allergies:  Allergies  Allergen Reactions    Metformin And Related     Diarrhea, nausea    Past Medical History:  Diagnosis Date   Diabetes mellitus 12/30/11   DKA (diabetic ketoacidoses) 12/30/11   new dx DM a1c 13   PVC (premature ventricular contraction)    LBB inferior axis QRS // followed by [name]   Right bundle branch block     Past Surgical History:  Procedure Laterality Date   KNEE ARTHROSCOPY  1999-2000   bilateral    Family History  Problem Relation Age of Onset   Diabetes Father    Diabetes Maternal Grandmother    Hypertension Mother    CAD Neg Hx     Social History:  reports that he has never smoked. He has never used smokeless tobacco. He reports current alcohol use. He reports that he does not use drugs.   Review of Systems   Hyperlipidemia:  Not on a statin drug, last was on pravastatin in 2020  His LDL is relatively higher over the last year and now over 100 His triglycerides are consistently over 200   Lab Results  Component Value Date   CHOL 199 04/16/2021   CHOL 169 08/13/2020   CHOL 149 12/14/2019   Lab Results  Component Value Date   HDL 35.50 (L) 04/16/2021   HDL 36.20 (L) 08/13/2020   HDL 29.80 (L) 12/14/2019   Lab Results  Component Value Date   LDLCALC 83 04/28/2017   LDLCALC 124 (H) 08/28/2016   LDLCALC 96 06/18/2014   Lab Results  Component Value Date   TRIG 255.0 (H) 04/16/2021   TRIG 201.0 (H) 08/13/2020   TRIG 218.0 (H) 12/14/2019   Lab Results  Component Value Date   CHOLHDL 6 04/16/2021   CHOLHDL 5 08/13/2020   CHOLHDL 5 12/14/2019   Lab Results  Component Value Date   LDLDIRECT 101.0 04/16/2021   LDLDIRECT 84.0 08/13/2020   LDLDIRECT 67.0 12/14/2019            Blood pressure: Not on medications  He is on Jardiance 25 mg only    BP Readings from Last 3 Encounters:  04/24/21 (!) 142/90  03/21/21 (!) 128/91  12/19/20 132/90    Most recent eye exam was 2022 but report not available  Most recent foot exam: 11/2017    LABS:  No  visits with results within 1 Week(s) from this visit.  Latest known visit with results is:  Lab  on 04/16/2021  Component Date Value Ref Range Status   Cholesterol 04/16/2021 199  0 - 200 mg/dL Final   ATP III Classification       Desirable:  < 200 mg/dL               Borderline High:  200 - 239 mg/dL          High:  > = 240 mg/dL   Triglycerides 04/16/2021 255.0 (A) 0.0 - 149.0 mg/dL Final   Normal:  <150 mg/dLBorderline High:  150 - 199 mg/dL   HDL 04/16/2021 35.50 (A) >39.00 mg/dL Final   VLDL 04/16/2021 51.0 (A) 0.0 - 40.0 mg/dL Final   Total CHOL/HDL Ratio 04/16/2021 6   Final                  Men          Women1/2 Average Risk     3.4          3.3Average Risk          5.0          4.42X Average Risk          9.6          7.13X Average Risk          15.0          11.0                       NonHDL 04/16/2021 163.13   Final   NOTE:  Non-HDL goal should be 30 mg/dL higher than patient's LDL goal (i.e. LDL goal of < 70 mg/dL, would have non-HDL goal of < 100 mg/dL)   Microalb, Ur 04/16/2021 <0.7  0.0 - 1.9 mg/dL Final   Creatinine,U 04/16/2021 113.1  mg/dL Final   Microalb Creat Ratio 04/16/2021 0.6  0.0 - 30.0 mg/g Final   Sodium 04/16/2021 139  135 - 145 mEq/L Final   Potassium 04/16/2021 4.1  3.5 - 5.1 mEq/L Final   Chloride 04/16/2021 103  96 - 112 mEq/L Final   CO2 04/16/2021 28  19 - 32 mEq/L Final   Glucose, Bld 04/16/2021 105 (A) 70 - 99 mg/dL Final   BUN 04/16/2021 14  6 - 23 mg/dL Final   Creatinine, Ser 04/16/2021 1.22  0.40 - 1.50 mg/dL Final   Total Bilirubin 04/16/2021 0.5  0.2 - 1.2 mg/dL Final   Alkaline Phosphatase 04/16/2021 68  39 - 117 U/L Final   AST 04/16/2021 19  0 - 37 U/L Final   ALT 04/16/2021 24  0 - 53 U/L Final   Total Protein 04/16/2021 8.0  6.0 - 8.3 g/dL Final   Albumin 04/16/2021 4.4  3.5 - 5.2 g/dL Final   GFR 04/16/2021 73.75  >60.00 mL/min Final   Calculated using the CKD-EPI Creatinine Equation (2021)   Calcium 04/16/2021 9.9  8.4 - 10.5 mg/dL  Final   Hgb A1c MFr Bld 04/16/2021 7.0 (A) 4.6 - 6.5 % Final   Glycemic Control Guidelines for People with Diabetes:Non Diabetic:  <6%Goal of Therapy: <7%Additional Action Suggested:  >8%    Direct LDL 04/16/2021 101.0  mg/dL Final   Optimal:  <100 mg/dLNear or Above Optimal:  100-129 mg/dLBorderline High:  130-159 mg/dLHigh:  160-189 mg/dLVery High:  >190 mg/dL    Physical Examination:  BP (!) 142/90   Pulse 95   Ht 6' (1.829 m)   Wt 278 lb (126.1 kg)   SpO2 98%  BMI 37.70 kg/m       ASSESSMENT:  Diabetes type 2, with obesity  See history of present illness for detailed discussion of current diabetes management, blood sugar patterns and problems identified  He is on a regimen of basal insulin 36 units, Jardiance 25 mg and Trulicity 4.5 mg  His S9H is usually increasing and now 7% He has done minimal glucose monitoring at home and no readings lately  His lab fasting glucose is fairly good and he has no symptoms of hypoglycemia and likely indicating adequate basal insulin Slightly high A1c may be related to more snacks or carbohydrates at meals   Hyperlipidemia: He has high triglycerides and relatively high LDL also Discussed cardiovascular risk reduction with statin drugs as well as need for weight loss to help triglycerides come back to normal Although is reluctant to add another medication discussed importance of prevention with having diabetes   PLAN:   Needs to do restart regular glucose monitoring after meals at least 3 to 4 days a week Discussed blood sugar targets at various times  If he has consistently high readings at any given meal may consider mealtime insulin  Also may consider switching to [facility] if A1c not improved on the next visit, briefly discussed how this is different  Start simvastatin 20 mg daily, this will help triglycerides to some extent also Needs to follow-up with his PCP regarding blood pressure management, likely needs to be on an ARB  drug Meanwhile he needs to try and avoid any fast food or high sodium intake Needs consistent exercise with brisk walking at least 5 days a week  Follow-up in 3 months  Patient Instructions  Check blood sugars on waking up 3-4 days a week  Also check blood sugars about 2 hours after meals and do this after different meals by rotation  Recommended blood sugar levels on waking up are 90-120 and about 2 hours after meal is 130-160  Please bring your blood sugar monitor to each visit, thank you  Low fat snack   Elayne Snare 04/25/2021, 8:04 AM   Note: This office note was prepared with Dragon voice recognition system technology. Any transcriptional errors that result from this process are unintentional.

## 2021-05-07 ENCOUNTER — Other Ambulatory Visit (HOSPITAL_COMMUNITY): Payer: Self-pay

## 2021-05-07 MED FILL — Empagliflozin Tab 25 MG: ORAL | 30 days supply | Qty: 30 | Fill #2 | Status: AC

## 2021-05-12 ENCOUNTER — Other Ambulatory Visit (HOSPITAL_COMMUNITY): Payer: Self-pay

## 2021-06-09 ENCOUNTER — Other Ambulatory Visit (HOSPITAL_COMMUNITY): Payer: Self-pay

## 2021-06-09 ENCOUNTER — Other Ambulatory Visit: Payer: Self-pay | Admitting: Endocrinology

## 2021-06-10 ENCOUNTER — Other Ambulatory Visit (HOSPITAL_COMMUNITY): Payer: Self-pay

## 2021-06-10 MED ORDER — TRULICITY 4.5 MG/0.5ML ~~LOC~~ SOAJ
4.5000 mg | SUBCUTANEOUS | 3 refills | Status: DC
  Filled 2021-06-10: qty 6, 84d supply, fill #0
  Filled 2021-09-02: qty 2, 28d supply, fill #1

## 2021-06-10 MED ORDER — EMPAGLIFLOZIN 25 MG PO TABS
25.0000 mg | ORAL_TABLET | Freq: Every day | ORAL | 2 refills | Status: DC
  Filled 2021-06-10: qty 90, 90d supply, fill #0

## 2021-06-13 ENCOUNTER — Other Ambulatory Visit (HOSPITAL_COMMUNITY): Payer: Self-pay

## 2021-06-19 ENCOUNTER — Other Ambulatory Visit (HOSPITAL_BASED_OUTPATIENT_CLINIC_OR_DEPARTMENT_OTHER): Payer: 59

## 2021-06-23 ENCOUNTER — Encounter (HOSPITAL_BASED_OUTPATIENT_CLINIC_OR_DEPARTMENT_OTHER): Payer: 59 | Admitting: Family Medicine

## 2021-07-07 ENCOUNTER — Encounter (HOSPITAL_BASED_OUTPATIENT_CLINIC_OR_DEPARTMENT_OTHER): Payer: Self-pay | Admitting: Family Medicine

## 2021-07-09 ENCOUNTER — Other Ambulatory Visit (HOSPITAL_COMMUNITY): Payer: Self-pay

## 2021-07-10 ENCOUNTER — Other Ambulatory Visit (HOSPITAL_COMMUNITY): Payer: Self-pay

## 2021-07-16 ENCOUNTER — Other Ambulatory Visit: Payer: 59

## 2021-07-18 ENCOUNTER — Ambulatory Visit: Payer: 59 | Admitting: Endocrinology

## 2021-08-07 ENCOUNTER — Other Ambulatory Visit: Payer: Self-pay | Admitting: Endocrinology

## 2021-08-07 ENCOUNTER — Other Ambulatory Visit (HOSPITAL_COMMUNITY): Payer: Self-pay

## 2021-08-07 MED ORDER — TRESIBA FLEXTOUCH 200 UNIT/ML ~~LOC~~ SOPN
86.0000 [IU] | PEN_INJECTOR | Freq: Every day | SUBCUTANEOUS | 3 refills | Status: DC
  Filled 2021-08-07: qty 12, 27d supply, fill #0
  Filled 2021-09-02: qty 12, 27d supply, fill #1

## 2021-08-08 ENCOUNTER — Other Ambulatory Visit (HOSPITAL_COMMUNITY): Payer: Self-pay

## 2021-08-09 ENCOUNTER — Other Ambulatory Visit (HOSPITAL_COMMUNITY): Payer: Self-pay

## 2021-08-09 DIAGNOSIS — Z0100 Encounter for examination of eyes and vision without abnormal findings: Secondary | ICD-10-CM | POA: Diagnosis not present

## 2021-08-26 ENCOUNTER — Other Ambulatory Visit: Payer: Self-pay

## 2021-08-26 ENCOUNTER — Encounter (HOSPITAL_BASED_OUTPATIENT_CLINIC_OR_DEPARTMENT_OTHER): Payer: Self-pay | Admitting: Family Medicine

## 2021-08-26 ENCOUNTER — Ambulatory Visit (INDEPENDENT_AMBULATORY_CARE_PROVIDER_SITE_OTHER): Payer: 59 | Admitting: Family Medicine

## 2021-08-26 DIAGNOSIS — Z0000 Encounter for general adult medical examination without abnormal findings: Secondary | ICD-10-CM | POA: Insufficient documentation

## 2021-08-26 NOTE — Assessment & Plan Note (Addendum)
The patient was counseled, risk factors were discussed, anticipatory guidance given. Reviewed health maintenance recommendations, recommended lifestyle modifications He has not received his flu shot this year, he would like to hold off given recent GI upset Discussed recommendation for regular dental and vision screenings Recommend continue follow-up with specialist regarding management of care Due to update various labs, would like to complete these in about 1 week Plan for patient to return for nurse visit for flu shot and labs in 1 week Plan for follow-up in about 3 to 4 months to monitor progress with chronic medical conditions, review recommendations from specialist

## 2021-08-26 NOTE — Patient Instructions (Signed)
°  Medication Instructions:  Your physician recommends that you continue on your current medications as directed. Please refer to the Current Medication list given to you today. --If you need a refill on any your medications before your next appointment, please call your pharmacy first. If no refills are authorized on file call the office.-- Lab Work: Your physician has recommended that you have lab in 1 WEEK - CBC, CMP, Lipid, A1C, and TSH If you have labs (blood work) drawn today and your tests are completely normal, you will receive your results via [facility] a phone call from our staff.  Please ensure you check your voicemail in the event that you authorized detailed messages to be left on a delegated number. If you have any lab test that is abnormal or we need to change your treatment, we will call you to review the results.  Follow-Up: Your next appointment:   Your physician recommends that you schedule a follow-up appointment in: 3-4 MONTHS with Dr. de [place]  You will receive a text message or e-mail with a link to a survey about your care and experience with [place] today! We would greatly appreciate your feedback!   Thanks for letting us be apart of your health journey!!  Primary Care and Sports Medicine   Dr. Arlina Robes [place]   We encourage you to activate your patient portal called "MyChart".  Sign up information is provided on this After Visit Summary.  MyChart is used to connect with patients for Virtual Visits (Telemedicine).  Patients are able to view lab/test results, encounter notes, upcoming appointments, etc.  Non-urgent messages can be sent to your provider as well. To learn more about what you can do with MyChart, please visit --  [email or web address].

## 2021-08-26 NOTE — Progress Notes (Signed)
Subjective:    CC: Annual Physical Exam  HPI:  Roy Jennings is a 41 y.o. presenting for annual physical  I reviewed the past medical history, family history, social history, surgical history, and allergies today and no changes were needed.  Please see the problem list section below in epic for further details.  Past Medical History: Past Medical History:  Diagnosis Date   Diabetes mellitus 12/30/11   DKA (diabetic ketoacidoses) 12/30/11   new dx DM a1c 13   PVC (premature ventricular contraction)    LBB inferior axis QRS // followed by [name]   Right bundle branch block    Past Surgical History: Past Surgical History:  Procedure Laterality Date   KNEE ARTHROSCOPY  1999-2000   bilateral   Social History: Social History   Socioeconomic History   Marital status: Married    Spouse name: Not on file   Number of children: 1   Years of education: BS   Highest education level: Not on file  Occupational History    Employer: [employer]    Comment: Counselor at [employer]  Tobacco Use   Smoking status: Never   Smokeless tobacco: Never  Vaping Use   Vaping Use: Never used  Substance and Sexual Activity   Alcohol use: Yes    Alcohol/week: 0.0 standard drinks    Comment: rarely   Drug use: No   Sexual activity: Yes  Other Topics Concern   Not on file  Social History Narrative   Married. Son '08. 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  Review of Systems: No headache, visual changes, nausea, vomiting, diarrhea, constipation, dizziness, abdominal pain, skin rash, fevers, chills, night sweats, swollen lymph nodes, weight loss, chest pain, body aches, joint swelling, muscle aches, shortness of breath, mood changes, visual or auditory hallucinations.  Objective:    BP 132/90    Pulse (!) 103    Ht 5\' 11"  (1.803 m)    Wt 280 lb 9.6 oz (127.3 kg)    SpO2 97%    BMI 39.14 kg/m   General: Well Developed, well nourished, and in no acute distress.  Neuro: Alert and oriented x3, extra-ocular muscles intact, sensation grossly intact. Cranial nerves II through XII are intact, motor, sensory, and coordinative functions are all intact. HEENT: Normocephalic, atraumatic, pupils equal round reactive to light, neck supple, no masses, no lymphadenopathy, thyroid nonpalpable. Oropharynx, nasopharynx, external ear canals are unremarkable. Skin: Warm and dry, no rashes noted.  Cardiac: Regular rate and rhythm, no murmurs rubs or gallops.  Respiratory: Clear to auscultation bilaterally. Not using accessory muscles, speaking in full sentences.  Abdominal: Soft, nontender, nondistended, positive bowel sounds, no masses, no organomegaly.  Musculoskeletal: Shoulder, elbow, wrist, hip, knee, ankle  stable, and with full range of motion.  Impression and Recommendations:    Wellness examination The patient was counseled, risk factors were discussed, anticipatory guidance given. Reviewed health maintenance recommendations, recommended lifestyle modifications He has not received his flu shot this year, he would like to hold off given recent GI upset Discussed recommendation for regular dental and vision screenings Recommend continue follow-up with specialist regarding management of care Due to update various labs, would like to  complete these in about 1 week Plan for patient to return for nurse visit for flu shot and labs in 1 week Plan for follow-up in about 3 to 4 months to monitor progress with chronic medical conditions, review recommendations from specialist   ___________________________________________ Bocephus Cali de [place], MD, ABFM, CAQSM Primary Care and [place]

## 2021-09-02 ENCOUNTER — Other Ambulatory Visit: Payer: Self-pay | Admitting: Endocrinology

## 2021-09-02 ENCOUNTER — Other Ambulatory Visit (HOSPITAL_COMMUNITY): Payer: Self-pay

## 2021-09-03 ENCOUNTER — Other Ambulatory Visit: Payer: Self-pay

## 2021-09-03 ENCOUNTER — Telehealth: Payer: Self-pay

## 2021-09-03 DIAGNOSIS — E1165 Type 2 diabetes mellitus with hyperglycemia: Secondary | ICD-10-CM

## 2021-09-03 NOTE — Telephone Encounter (Signed)
Received fax from pharmacy. Roy Jennings is not preferred but Semeglee is covered at $0. Please advise.

## 2021-09-04 ENCOUNTER — Other Ambulatory Visit (HOSPITAL_COMMUNITY): Payer: Self-pay

## 2021-09-04 ENCOUNTER — Other Ambulatory Visit: Payer: Self-pay

## 2021-09-04 DIAGNOSIS — E1165 Type 2 diabetes mellitus with hyperglycemia: Secondary | ICD-10-CM

## 2021-09-04 MED ORDER — INSULIN GLARGINE-YFGN 100 UNIT/ML ~~LOC~~ SOPN
PEN_INJECTOR | SUBCUTANEOUS | 0 refills | Status: DC
  Filled 2021-09-04: qty 15, 34d supply, fill #0

## 2021-09-04 MED ORDER — INSULIN GLARGINE-YFGN 100 UNIT/ML ~~LOC~~ SOPN
PEN_INJECTOR | SUBCUTANEOUS | 0 refills | Status: DC
  Filled 2021-09-04 – 2021-10-03 (×2): qty 30, 34d supply, fill #0

## 2021-09-04 NOTE — Telephone Encounter (Signed)
Patient has been scheduled for labs. Just to confirm semeglee is 44 units daily?

## 2021-09-05 ENCOUNTER — Other Ambulatory Visit (HOSPITAL_COMMUNITY): Payer: Self-pay

## 2021-09-05 NOTE — Telephone Encounter (Signed)
Rx sent 

## 2021-09-12 ENCOUNTER — Other Ambulatory Visit (HOSPITAL_COMMUNITY): Payer: Self-pay

## 2021-09-19 ENCOUNTER — Other Ambulatory Visit: Payer: Self-pay

## 2021-09-19 ENCOUNTER — Other Ambulatory Visit (INDEPENDENT_AMBULATORY_CARE_PROVIDER_SITE_OTHER): Payer: 59

## 2021-09-19 DIAGNOSIS — E1165 Type 2 diabetes mellitus with hyperglycemia: Secondary | ICD-10-CM

## 2021-09-19 DIAGNOSIS — E782 Mixed hyperlipidemia: Secondary | ICD-10-CM | POA: Diagnosis not present

## 2021-09-19 DIAGNOSIS — Z794 Long term (current) use of insulin: Secondary | ICD-10-CM

## 2021-09-19 LAB — COMPREHENSIVE METABOLIC PANEL
ALT: 40 U/L (ref 0–53)
AST: 25 U/L (ref 0–37)
Albumin: 4.8 g/dL (ref 3.5–5.2)
Alkaline Phosphatase: 85 U/L (ref 39–117)
BUN: 10 mg/dL (ref 6–23)
CO2: 32 mEq/L (ref 19–32)
Calcium: 9.9 mg/dL (ref 8.4–10.5)
Chloride: 101 mEq/L (ref 96–112)
Creatinine, Ser: 1.32 mg/dL (ref 0.40–1.50)
GFR: 66.9 mL/min (ref >60.00)
Glucose, Bld: 134 mg/dL — ABNORMAL HIGH (ref 70–99)
Potassium: 4.2 mEq/L (ref 3.5–5.1)
Sodium: 139 mEq/L (ref 135–145)
Total Bilirubin: 0.6 mg/dL (ref 0.2–1.2)
Total Protein: 8 g/dL (ref 6.0–8.3)

## 2021-09-19 LAB — LIPID PANEL
Cholesterol: 198 mg/dL (ref 0–200)
HDL: 38.4 mg/dL — ABNORMAL LOW (ref >39.00)
NonHDL: 159.94
Total CHOL/HDL Ratio: 5
Triglycerides: 242 mg/dL — ABNORMAL HIGH (ref 0.0–149.0)
VLDL: 48.4 mg/dL — ABNORMAL HIGH (ref 0.0–40.0)

## 2021-09-19 LAB — HEMOGLOBIN A1C: Hgb A1c MFr Bld: 8.3 % — ABNORMAL HIGH (ref 4.6–6.5)

## 2021-09-19 LAB — LDL CHOLESTEROL, DIRECT: Direct LDL: 94 mg/dL

## 2021-09-24 ENCOUNTER — Ambulatory Visit: Payer: 59 | Admitting: Endocrinology

## 2021-09-29 ENCOUNTER — Other Ambulatory Visit (HOSPITAL_COMMUNITY): Payer: Self-pay

## 2021-09-29 ENCOUNTER — Other Ambulatory Visit: Payer: Self-pay | Admitting: Endocrinology

## 2021-09-30 ENCOUNTER — Other Ambulatory Visit (HOSPITAL_COMMUNITY): Payer: Self-pay

## 2021-09-30 MED ORDER — TRULICITY 4.5 MG/0.5ML ~~LOC~~ SOAJ
4.5000 mg | SUBCUTANEOUS | 0 refills | Status: DC
  Filled 2021-09-30: qty 2, 28d supply, fill #0

## 2021-10-03 ENCOUNTER — Other Ambulatory Visit (HOSPITAL_COMMUNITY): Payer: Self-pay

## 2021-10-08 ENCOUNTER — Other Ambulatory Visit: Payer: Self-pay

## 2021-10-08 ENCOUNTER — Ambulatory Visit (INDEPENDENT_AMBULATORY_CARE_PROVIDER_SITE_OTHER): Payer: 59 | Admitting: Family Medicine

## 2021-10-08 ENCOUNTER — Encounter (HOSPITAL_BASED_OUTPATIENT_CLINIC_OR_DEPARTMENT_OTHER): Payer: Self-pay | Admitting: Family Medicine

## 2021-10-08 ENCOUNTER — Other Ambulatory Visit (HOSPITAL_COMMUNITY): Payer: Self-pay

## 2021-10-08 DIAGNOSIS — J3089 Other allergic rhinitis: Secondary | ICD-10-CM | POA: Diagnosis not present

## 2021-10-08 MED ORDER — FEXOFENADINE HCL 180 MG PO TABS
180.0000 mg | ORAL_TABLET | Freq: Every day | ORAL | 1 refills | Status: AC
  Filled 2021-10-08: qty 90, 90d supply, fill #0

## 2021-10-08 MED ORDER — TRIAMCINOLONE ACETONIDE 55 MCG/ACT NA AERO
2.0000 | INHALATION_SPRAY | Freq: Every day | NASAL | 3 refills | Status: AC
  Filled 2021-10-08: qty 16.9, 1d supply, fill #0

## 2021-10-08 NOTE — Progress Notes (Signed)
? ?  Virtual Visit via Telephone ?  ?I connected with  Roy Jennings  on 10/08/21 by telephone/telehealth and verified that I am speaking with the correct person using two identifiers. ?  ?I discussed the limitations, risks, security and privacy concerns of performing an evaluation and management service by telephone, including the higher likelihood of inaccurate diagnosis and treatment, and the availability of in person appointments.  We also discussed the likely need of an additional face to face encounter for complete and high quality delivery of care.  I also discussed with the patient that there may be a patient responsible charge related to this service. The patient expressed understanding and wishes to proceed. ? ?Provider location is in medical facility. ?Patient location is at their home, different from provider location. ?People involved in care of the patient during this telehealth encounter were myself, my nurse/medical assistant, and my front office/scheduling team member. ? ?Review of Systems: No fevers, chills, night sweats, weight loss, chest pain, or shortness of breath.  ? ?Objective Findings:   ? ?General: Speaking full sentences, no audible heavy breathing.  Sounds alert and appropriately interactive.   ? ?Independent interpretation of tests performed by another provider:  ? ?None. ? ?Brief History, Exam, Impression, and Recommendations:   ? ?Environmental and seasonal allergies ?Patient reports that over the past couple weeks he has been having some coughing, sinus pressure, postnasal drip, itchy throat.  He has not had any fevers, chills, night sweats.  He feels that early on when symptoms started, he may have felt somewhat sick, however generally has been feeling well other than ongoing symptoms.  He has been trying Claritin, Alka-Seltzer with mild relief.  He denies significant history of allergies, however wonders if the symptoms may be related to environmental allergies.  Denies any sick  contacts. ?Discussed options with patient, at this time symptoms seem most consistent with allergies and less likely related to any acute URI or sinus infection ?Will send in prescription for Allegra daily as well as Nasacort.  Advised patient to compare prices of these with OTC options and use whichever is cheaper ?Also recommended use of nasal saline spray which he can obtain over-the-counter ?Patient will monitor for improvement in symptoms, if not having notable improvement over the next 3 to 4 weeks, recommend returning to the office for further evaluation and management ? ?I discussed the above assessment and treatment plan with the patient. The patient was provided an opportunity to ask questions and all were answered. The patient agreed with the plan and demonstrated an understanding of the instructions. ?  ?The patient was advised to call back or seek an in-person evaluation if the symptoms worsen or if the condition fails to improve as anticipated. ?  ?I provided 10 minutes of face to face and non-face-to-face time during this encounter date, time was needed to gather information, review chart, records, communicate/coordinate with staff remotely, as well as complete documentation. ? ? ?___________________________________________ ?Koury Roddy de [place], MD, ABFM, CAQSM ?Primary Care and Sports Medicine ? [facility] ?

## 2021-10-08 NOTE — Assessment & Plan Note (Signed)
Patient reports that over the past couple weeks he has been having some coughing, sinus pressure, postnasal drip, itchy throat.  He has not had any fevers, chills, night sweats.  He feels that early on when symptoms started, he may have felt somewhat sick, however generally has been feeling well other than ongoing symptoms.  He has been trying Claritin, Alka-Seltzer with mild relief.  He denies significant history of allergies, however wonders if the symptoms may be related to environmental allergies.  Denies any sick contacts. ?Discussed options with patient, at this time symptoms seem most consistent with allergies and less likely related to any acute URI or sinus infection ?Will send in prescription for Allegra daily as well as Nasacort.  Advised patient to compare prices of these with OTC options and use whichever is cheaper ?Also recommended use of nasal saline spray which he can obtain over-the-counter ?Patient will monitor for improvement in symptoms, if not having notable improvement over the next 3 to 4 weeks, recommend returning to the office for further evaluation and management ?

## 2021-10-16 ENCOUNTER — Other Ambulatory Visit: Payer: Self-pay

## 2021-10-16 ENCOUNTER — Encounter: Payer: Self-pay | Admitting: Endocrinology

## 2021-10-16 ENCOUNTER — Ambulatory Visit (INDEPENDENT_AMBULATORY_CARE_PROVIDER_SITE_OTHER): Payer: 59 | Admitting: Endocrinology

## 2021-10-16 ENCOUNTER — Other Ambulatory Visit (HOSPITAL_COMMUNITY): Payer: Self-pay

## 2021-10-16 VITALS — BP 140/98 | HR 104 | Ht 71.0 in | Wt 278.0 lb

## 2021-10-16 DIAGNOSIS — Z794 Long term (current) use of insulin: Secondary | ICD-10-CM

## 2021-10-16 DIAGNOSIS — E1165 Type 2 diabetes mellitus with hyperglycemia: Secondary | ICD-10-CM | POA: Diagnosis not present

## 2021-10-16 DIAGNOSIS — E782 Mixed hyperlipidemia: Secondary | ICD-10-CM

## 2021-10-16 MED ORDER — FREESTYLE LIBRE 3 SENSOR MISC
1.0000 | 2 refills | Status: DC
  Filled 2021-10-16 – 2021-10-27 (×2): qty 2, 28d supply, fill #0
  Filled 2021-11-24: qty 2, 28d supply, fill #1
  Filled 2022-01-07: qty 2, 28d supply, fill #2

## 2021-10-16 MED ORDER — INSULIN LISPRO (1 UNIT DIAL) 100 UNIT/ML (KWIKPEN)
10.0000 [IU] | PEN_INJECTOR | Freq: Three times a day (TID) | SUBCUTANEOUS | 1 refills | Status: DC
  Filled 2021-10-16: qty 15, 50d supply, fill #0

## 2021-10-16 NOTE — Progress Notes (Signed)
Patient ID: Roy Jennings, male   DOB: July 23, 1981, 41 y.o.   MRN: [id number]  ? ?       ? ? ?Reason for Appointment: Follow-up for Type 2 Diabetes ? ? ? ?History of Present Illness:  ?        ?Date of diagnosis of type 2 diabetes mellitus: 12/2011      ? ?Background history:  ? ?He was initially diagnosed with marked hyperglycemia and A1c of 13.4% ?He was apparently started on insulin at onset and has taken various insulin regimens in the past ?He has had difficulty tolerating metformin and has not taken this most of the time ?He has probably been on Lantus since about 08/2015, previously on the 70/30 insulin ?His level of control had improved initially in 2014 and 2015 ? ?Recent history:  ? ?INSULIN regimen is: 44 units generic glargine bid ? ?Non-insulin hypoglycemic drugs the patient is taking are:  Jardiance 25 mg in am, Trulicity 4.5 weekly ? ? ?Current management, blood sugar patterns and problems identified: ? ?His A1c is again getting higher at 8.3 compared to as low as 6.4 in January 22 ? ?His monitor was set to the wrong date and time and difficult to know what his blood sugars are  ?Also he is checking readings only in the mornings is very rarely checking his blood sugars and none in the last month or so ?He is recently doing exercise at the gym but inconsistently ?Lab glucose was 134 fasting ?He has not lost any weight since his last visit ?No side effects with Jardiance or Trulicity, currently on 4.5 mg Trulicity ?His insurance did not cover [place] and he is now taking the same dose with generic glargine in split doses twice a day ? ?      ?Dinner usually at 7-8 pm  ? ?Side effects from medications have been: Diarrhea from most doses of metformin ? ?Compliance with the medical regimen: Inconsistent ? ?Hypoglycemia:   none ? ?Glucose monitoring:  done 0-1 times a day         Glucometer: Freestyle lite ?  ?Blood Glucose readings:  ? ?Recent blood sugars mostly around 160 fasting with some  variability ? ? ?Self-care: The diet that the patient has been following is: None   ?Typical meal intake: Variable ?       ?Dietician visit, most recent: 08/2015, diabetes educator in 08/2018 ?              ? ?Weight history: ? ? ?Wt Readings from Last 3 Encounters:  ?10/16/21 278 lb (126.1 kg)  ?08/26/21 280 lb 9.6 oz (127.3 kg)  ?04/24/21 278 lb (126.1 kg)  ? ? ?Glycemic control: ?  ?Lab Results  ?Component Value Date  ? HGBA1C 8.3 (H) 09/19/2021  ? HGBA1C 7.0 (H) 04/16/2021  ? HGBA1C 6.6 (H) 12/13/2020  ? ?Lab Results  ?Component Value Date  ? MICROALBUR <0.7 04/16/2021  ? [place] 83 04/28/2017  ? CREATININE 1.32 09/19/2021  ? ?Lab Results  ?Component Value Date  ? MICRALBCREAT 0.6 04/16/2021  ? ? ?Lab Results  ?Component Value Date  ? FRUCTOSAMINE 283 08/31/2018  ? FRUCTOSAMINE 292 (H) 03/23/2018  ? FRUCTOSAMINE 275 (H) 11/05/2017  ? ?Other active problems: See review of systems ? ? ?Allergies as of 10/16/2021   ? ?   Reactions  ? Metformin And Related   ? Diarrhea, nausea  ? ?  ? ?  ?Medication List  ?  ? ?  ? Accurate  as of October 16, 2021  7:36 PM. If you have any questions, ask your nurse or doctor.  ?  ?  ? ?  ? ?fexofenadine 180 MG tablet ?Commonly known as: ALLEGRA ?Take 1 tablet (180 mg total) by mouth daily. ?  ?FreeStyle Freedom Lite w/Device Kit ?Use as instructed to check blood sugar twice daily. ?  ?freestyle lancets ?1 each by Other route 2 (two) times daily. for testing ?  ?FreeStyle Libre 3 Sensor Misc ?Apply 1 sensor on upper arm every 14 days for continuous glucose monitoring ?Started by: Elayne Snare, MD ?  ?glucose blood test strip ?Commonly known as: FREESTYLE LITE ?Use as instructed to check blood sugar twice daily. ?  ?insulin lispro 100 UNIT/ML KwikPen ?Commonly known as: HumaLOG KwikPen ?Inject 10 Units into the skin 3 (three) times daily before meals ?Started by: Elayne Snare, MD ?  ?Insulin Pen Needle 31G X 8 MM Misc ?Commonly known as: Unifine Pentips ?USE TO INJECT INSULIN 1-3 TIMES INTO  SKIN DAILY ?  ?Jardiance 25 MG Tabs tablet ?Generic drug: empagliflozin ?Take 1 tablet (25 mg total) by mouth daily. ?  ?Semglee (yfgn) 100 UNIT/ML Pen ?Generic drug: insulin glargine-yfgn ?Inject 44 units under the skin twice a day ?  ?simvastatin 20 MG tablet ?Commonly known as: ZOCOR ?Take 1 tablet (20 mg total) by mouth at bedtime. ?  ?triamcinolone 55 MCG/ACT Aero nasal inhaler ?Commonly known as: NASACORT ?Place 2 sprays into the nose daily. ?  ?Trulicity 4.5 UE/4.5WU Sopn ?Generic drug: Dulaglutide ?Inject 4.5 mg into the skin once a week. ?  ? ?  ? ? ?Allergies:  ?Allergies  ?Allergen Reactions  ? Metformin And Related   ?  Diarrhea, nausea  ? ? ?Past Medical History:  ?Diagnosis Date  ? Diabetes mellitus 12/30/11  ? DKA (diabetic ketoacidoses) 12/30/11  ? new dx DM a1c 13  ? PVC (premature ventricular contraction)   ? LBB inferior axis QRS // followed by [name]  ? Right bundle branch block   ? ? ?Past Surgical History:  ?Procedure Laterality Date  ? KNEE ARTHROSCOPY  1999-2000  ? bilateral  ? ? ?Family History  ?Problem Relation Age of Onset  ? Diabetes Father   ? Diabetes Maternal Grandmother   ? Hypertension Mother   ? CAD Neg Hx   ? ? ?Social History:  reports that he has never smoked. He has never used smokeless tobacco. He reports current alcohol use. He reports that he does not use drugs. ? ? ?Review of Systems ? ? ?Hyperlipidemia: ? ?He is back on simvastatin as a statin drug, last was on pravastatin in 2020 ?LDL is improved ?His triglycerides are consistently over 200 ?  ?Lab Results  ?Component Value Date  ? CHOL 198 09/19/2021  ? CHOL 199 04/16/2021  ? CHOL 169 08/13/2020  ? ?Lab Results  ?Component Value Date  ? HDL 38.40 (L) 09/19/2021  ? HDL 35.50 (L) 04/16/2021  ? HDL 36.20 (L) 08/13/2020  ? ?Lab Results  ?Component Value Date  ? [place] 83 04/28/2017  ? LDLCALC 124 (H) 08/28/2016  ? [place] 96 06/18/2014  ? ?Lab Results  ?Component Value Date  ? TRIG 242.0 (H) 09/19/2021  ? TRIG  255.0 (H) 04/16/2021  ? TRIG 201.0 (H) 08/13/2020  ? ?Lab Results  ?Component Value Date  ? CHOLHDL 5 09/19/2021  ? CHOLHDL 6 04/16/2021  ? CHOLHDL 5 08/13/2020  ? ?Lab Results  ?Component Value Date  ? LDLDIRECT 94.0 09/19/2021  ?  LDLDIRECT 101.0 04/16/2021  ? LDLDIRECT 84.0 08/13/2020  ? ?     ?    ?Blood pressure: He has been seen by his PCP couple of times this year but not recommended any treatment for hypertension ? ?He is on Jardiance 25 mg only  ? ? ?BP Readings from Last 3 Encounters:  ?10/16/21 (!) 140/98  ?08/26/21 132/90  ?04/24/21 (!) 142/90  ? ? ?Most recent eye exam was 2022 but report not available ? ?Most recent foot exam: 11/2017 ? ? ? ?LABS: ? ?No visits with results within 1 Week(s) from this visit.  ?Latest known visit with results is:  ?Lab on 09/19/2021  ?Component Date Value Ref Range Status  ? Cholesterol 09/19/2021 198  0 - 200 mg/dL Final  ? ATP III Classification       Desirable:  < 200 mg/dL               Borderline High:  200 - 239 mg/dL          High:  > = 240 mg/dL  ? Triglycerides 09/19/2021 242.0 (H)  0.0 - 149.0 mg/dL Final  ? Normal:  <150 mg/dLBorderline High:  150 - 199 mg/dL  ? HDL 09/19/2021 38.40 (L)  >39.00 mg/dL Final  ? VLDL 09/19/2021 48.4 (H)  0.0 - 40.0 mg/dL Final  ? Total CHOL/HDL Ratio 09/19/2021 5   Final  ?                Men          Women1/2 Average Risk     3.4          3.3Average Risk          5.0          4.42X Average Risk          9.6          7.13X Average Risk          15.0          11.0                      ? NonHDL 09/19/2021 159.94   Final  ? NOTE:  Non-HDL goal should be 30 mg/dL higher than patient's LDL goal (i.e. LDL goal of < 70 mg/dL, would have non-HDL goal of < 100 mg/dL)  ? Sodium 09/19/2021 139  135 - 145 mEq/L Final  ? Potassium 09/19/2021 4.2  3.5 - 5.1 mEq/L Final  ? Chloride 09/19/2021 101  96 - 112 mEq/L Final  ? CO2 09/19/2021 32  19 - 32 mEq/L Final  ? Glucose, Bld 09/19/2021 134 (H)  70 - 99 mg/dL Final  ? BUN 09/19/2021 10  6 - 23  mg/dL Final  ? Creatinine, Ser 09/19/2021 1.32  0.40 - 1.50 mg/dL Final  ? Total Bilirubin 09/19/2021 0.6  0.2 - 1.2 mg/dL Final  ? Alkaline Phosphatase 09/19/2021 85  39 - 117 U/L Final  ? AST 09/19/2021 25  0 - 37 U/L Final  ?

## 2021-10-16 NOTE — Patient Instructions (Addendum)
Check blood sugars on waking up 4-5 days a week ? ?Also check blood sugars about 2 hours after meals and do this after different meals by rotation ? ?Recommended blood sugar levels on waking up are 90-130 and about 2 hours after meal is 130-180 ? ?Please bring your blood sugar monitor to each visit, thank you ? ?Humalog 10 units at supper and keep 9 pm <180, go up 2-4 units if needed ? ?Libreview app 3 on phone ?

## 2021-10-22 ENCOUNTER — Other Ambulatory Visit (HOSPITAL_COMMUNITY): Payer: Self-pay

## 2021-10-24 ENCOUNTER — Other Ambulatory Visit (HOSPITAL_COMMUNITY): Payer: Self-pay

## 2021-10-27 ENCOUNTER — Other Ambulatory Visit: Payer: Self-pay | Admitting: Endocrinology

## 2021-10-27 ENCOUNTER — Other Ambulatory Visit (HOSPITAL_COMMUNITY): Payer: Self-pay

## 2021-10-27 MED ORDER — TRULICITY 4.5 MG/0.5ML ~~LOC~~ SOAJ
4.5000 mg | SUBCUTANEOUS | 0 refills | Status: DC
  Filled 2021-10-27: qty 2, 28d supply, fill #0

## 2021-10-30 ENCOUNTER — Other Ambulatory Visit (HOSPITAL_COMMUNITY): Payer: Self-pay

## 2021-11-03 ENCOUNTER — Other Ambulatory Visit (HOSPITAL_COMMUNITY): Payer: Self-pay

## 2021-11-03 ENCOUNTER — Other Ambulatory Visit: Payer: Self-pay

## 2021-11-03 DIAGNOSIS — E1165 Type 2 diabetes mellitus with hyperglycemia: Secondary | ICD-10-CM

## 2021-11-03 MED ORDER — INSULIN LISPRO (1 UNIT DIAL) 100 UNIT/ML (KWIKPEN)
10.0000 [IU] | PEN_INJECTOR | Freq: Three times a day (TID) | SUBCUTANEOUS | 1 refills | Status: DC
  Filled 2021-11-03: qty 15, 50d supply, fill #0
  Filled 2022-01-07: qty 15, 50d supply, fill #1

## 2021-11-04 ENCOUNTER — Other Ambulatory Visit (HOSPITAL_COMMUNITY): Payer: Self-pay

## 2021-11-04 ENCOUNTER — Other Ambulatory Visit: Payer: Self-pay

## 2021-11-04 DIAGNOSIS — E1165 Type 2 diabetes mellitus with hyperglycemia: Secondary | ICD-10-CM

## 2021-11-04 MED ORDER — TRULICITY 4.5 MG/0.5ML ~~LOC~~ SOAJ
4.5000 mg | SUBCUTANEOUS | 0 refills | Status: DC
  Filled 2021-11-04: qty 2, 28d supply, fill #0

## 2021-11-07 ENCOUNTER — Other Ambulatory Visit (HOSPITAL_COMMUNITY): Payer: Self-pay

## 2021-11-18 ENCOUNTER — Other Ambulatory Visit: Payer: Self-pay | Admitting: Endocrinology

## 2021-11-18 ENCOUNTER — Other Ambulatory Visit (HOSPITAL_COMMUNITY): Payer: Self-pay

## 2021-11-18 DIAGNOSIS — E1165 Type 2 diabetes mellitus with hyperglycemia: Secondary | ICD-10-CM

## 2021-11-18 MED ORDER — INSULIN GLARGINE-YFGN 100 UNIT/ML ~~LOC~~ SOPN
PEN_INJECTOR | SUBCUTANEOUS | 2 refills | Status: DC
  Filled 2021-11-18: qty 30, 34d supply, fill #0
  Filled 2022-01-07: qty 30, 34d supply, fill #1
  Filled 2022-02-16: qty 30, 34d supply, fill #2

## 2021-11-19 ENCOUNTER — Other Ambulatory Visit (HOSPITAL_COMMUNITY): Payer: Self-pay

## 2021-11-24 ENCOUNTER — Other Ambulatory Visit (HOSPITAL_COMMUNITY): Payer: Self-pay

## 2021-11-26 ENCOUNTER — Other Ambulatory Visit (INDEPENDENT_AMBULATORY_CARE_PROVIDER_SITE_OTHER): Payer: 59

## 2021-11-26 DIAGNOSIS — E1165 Type 2 diabetes mellitus with hyperglycemia: Secondary | ICD-10-CM

## 2021-11-26 DIAGNOSIS — Z794 Long term (current) use of insulin: Secondary | ICD-10-CM | POA: Diagnosis not present

## 2021-11-26 LAB — BASIC METABOLIC PANEL
BUN: 10 mg/dL (ref 6–23)
CO2: 28 mEq/L (ref 19–32)
Calcium: 9.9 mg/dL (ref 8.4–10.5)
Chloride: 102 mEq/L (ref 96–112)
Creatinine, Ser: 1.12 mg/dL (ref 0.40–1.50)
GFR: 81.37 mL/min (ref >60.00)
Glucose, Bld: 114 mg/dL — ABNORMAL HIGH (ref 70–99)
Potassium: 4 mEq/L (ref 3.5–5.1)
Sodium: 139 mEq/L (ref 135–145)

## 2021-11-27 LAB — FRUCTOSAMINE: Fructosamine: 266 umol/L (ref 0–285)

## 2021-12-01 ENCOUNTER — Ambulatory Visit (INDEPENDENT_AMBULATORY_CARE_PROVIDER_SITE_OTHER): Payer: 59 | Admitting: Endocrinology

## 2021-12-01 VITALS — BP 128/82 | HR 103 | Ht 71.0 in | Wt 281.0 lb

## 2021-12-01 DIAGNOSIS — E782 Mixed hyperlipidemia: Secondary | ICD-10-CM

## 2021-12-01 DIAGNOSIS — E669 Obesity, unspecified: Secondary | ICD-10-CM | POA: Diagnosis not present

## 2021-12-01 DIAGNOSIS — Z794 Long term (current) use of insulin: Secondary | ICD-10-CM

## 2021-12-01 DIAGNOSIS — E1165 Type 2 diabetes mellitus with hyperglycemia: Secondary | ICD-10-CM

## 2021-12-01 NOTE — Progress Notes (Signed)
Patient ID: Roy Jennings, male   DOB: [birth year], 41 y.o.   MRN: [id number]  ? ?       ? ? ?Reason for Appointment: Follow-up for Type 2 Diabetes ? ? ? ?History of Present Illness:  ?        ?Date of diagnosis of type 2 diabetes mellitus: 12/2011      ? ?Background history:  ? ?He was initially diagnosed with marked hyperglycemia and A1c of 13.4% ?He was apparently started on insulin at onset and has taken various insulin regimens in the past ?He has had difficulty tolerating metformin and has not taken this most of the time ?He has probably been on Lantus since about 08/2015, previously on the 70/30 insulin ?His level of control had improved initially in 2014 and 2015 ? ?Recent history:  ? ?INSULIN regimen is: 44 units generic glargine bid ? ?Non-insulin hypoglycemic drugs the patient is taking are:  Jardiance 25 mg in am, Trulicity 4.5 weekly ? ? ?Current management, blood sugar patterns and problems identified: ? ?His A1c is last 8.3 but his fructosamine is now 266, better than usual ? ? ?He was able to start the freestyle libre sensor although did not get a refill on the sensor last week  ?With his job independent program is working able to get this free  ?with this his blood sugars appear to be very stable and generally well controlled although appear to be higher overnight  ?He says he is trying to do a 16-hour fast and will not eat between 6 PM and 11 AM  ?Only occasionally will have a higher postprandial reading after his first meal but generally well controlled after meals ?He has not lost any weight since his last visit however ?No side effects with Jardiance or  4.5 mg Trulicity ?He does not forget his insulin doses ?No hypoglycemia although blood sugars tend to be lower before his first meal ?He does try to walk 2 miles every morning ? ?      ?Dinner usually at 10 pm  ? ?Side effects from medications have been: Diarrhea from most doses of metformin ? ?FREESTYLE libre 3 data between 4/11 and 4/24: ? ?CGM  use % of time 70  ?2-week average/GV 133 19  ?Time in range      96%  ?% Time Above 180 4  ?% Time above 250   ?% Time Below 70   ? ?  ?PRE-MEAL Fasting Lunch Dinner Bedtime Overall  ?Glucose range:       ?Averages: 140 113 133  133  ? ?POST-MEAL PC Breakfast PC Lunch PC Dinner  ?Glucose range:     ?Averages:  130 146  ? ? ? ?Self-care: The diet that the patient has been following is: None   ?Typical meal intake: Variable ?       ?Dietician visit, most recent: 08/2015, diabetes educator in 08/2018 ?              ? ?Weight history: ? ? ?Wt Readings from Last 3 Encounters:  ?12/01/21 281 lb (127.5 kg)  ?10/16/21 278 lb (126.1 kg)  ?08/26/21 280 lb 9.6 oz (127.3 kg)  ? ? ?Glycemic control: ?  ?Lab Results  ?Component Value Date  ? HGBA1C 8.3 (H) 09/19/2021  ? HGBA1C 7.0 (H) 04/16/2021  ? HGBA1C 6.6 (H) 12/13/2020  ? ?Lab Results  ?Component Value Date  ? MICROALBUR <0.7 04/16/2021  ? [place] 83 04/28/2017  ? CREATININE 1.12 11/26/2021  ? ?Lab  Results  ?Component Value Date  ? MICRALBCREAT 0.6 04/16/2021  ? ? ?Lab Results  ?Component Value Date  ? FRUCTOSAMINE 266 11/26/2021  ? FRUCTOSAMINE 283 08/31/2018  ? FRUCTOSAMINE 292 (H) 03/23/2018  ? ?Other active problems: See review of systems ? ? ?Allergies as of 12/01/2021   ? ?   Reactions  ? Metformin And Related   ? Diarrhea, nausea  ? ?  ? ?  ?Medication List  ?  ? ?  ? Accurate as of Dec 01, 2021  2:47 PM. If you have any questions, ask your nurse or doctor.  ?  ?  ? ?  ? ?fexofenadine 180 MG tablet ?Commonly known as: ALLEGRA ?Take 1 tablet (180 mg total) by mouth daily. ?  ?FreeStyle Freedom Lite w/Device Kit ?Use as instructed to check blood sugar twice daily. ?  ?freestyle lancets ?1 each by Other route 2 (two) times daily. for testing ?  ?FreeStyle Libre 3 Sensor Misc ?Apply 1 sensor on upper arm every 14 days for continuous glucose monitoring ?  ?glucose blood test strip ?Commonly known as: FREESTYLE LITE ?Use as instructed to check blood sugar twice daily. ?   ?HumaLOG KwikPen 100 UNIT/ML KwikPen ?Generic drug: insulin lispro ?Inject 10 Units into the skin 3 (three) times daily before meals ?What changed: additional instructions ?  ?Insulin Pen Needle 31G X 8 MM Misc ?Commonly known as: Unifine Pentips ?USE TO INJECT INSULIN 1-3 TIMES INTO SKIN DAILY ?  ?Jardiance 25 MG Tabs tablet ?Generic drug: empagliflozin ?Take 1 tablet (25 mg total) by mouth daily. ?  ?Semglee (yfgn) 100 UNIT/ML Pen ?Generic drug: insulin glargine-yfgn ?Inject 44 units under the skin twice a day ?  ?simvastatin 20 MG tablet ?Commonly known as: ZOCOR ?Take 1 tablet (20 mg total) by mouth at bedtime. ?  ?triamcinolone 55 MCG/ACT Aero nasal inhaler ?Commonly known as: NASACORT ?Place 2 sprays into the nose daily. ?  ?Trulicity 4.5 SL/3.7DS Sopn ?Generic drug: Dulaglutide ?Inject 4.5 mg into the skin once a week. ?  ? ?  ? ? ?Allergies:  ?Allergies  ?Allergen Reactions  ? Metformin And Related   ?  Diarrhea, nausea  ? ? ?Past Medical History:  ?Diagnosis Date  ? Diabetes mellitus 12/30/11  ? DKA (diabetic ketoacidoses) 12/30/11  ? new dx DM a1c 13  ? PVC (premature ventricular contraction)   ? LBB inferior axis QRS // followed by [name]  ? Right bundle branch block   ? ? ?Past Surgical History:  ?Procedure Laterality Date  ? KNEE ARTHROSCOPY  1999-2000  ? bilateral  ? ? ?Family History  ?Problem Relation Age of Onset  ? Diabetes Father   ? Diabetes Maternal Grandmother   ? Hypertension Mother   ? CAD Neg Hx   ? ? ?Social History:  reports that he has never smoked. He has never used smokeless tobacco. He reports current alcohol use. He reports that he does not use drugs. ? ? ?Review of Systems ? ? ?Hyperlipidemia: ? ?He is back on simvastatin as a statin drug, last was on pravastatin in 2020 ?LDL is under 100 ?His triglycerides are consistently over 200 ?  ?Lab Results  ?Component Value Date  ? CHOL 198 09/19/2021  ? CHOL 199 04/16/2021  ? CHOL 169 08/13/2020  ? ?Lab Results  ?Component Value  Date  ? HDL 38.40 (L) 09/19/2021  ? HDL 35.50 (L) 04/16/2021  ? HDL 36.20 (L) 08/13/2020  ? ?Lab Results  ?Component Value Date  ?  [place] 83 04/28/2017  ? LDLCALC 124 (H) 08/28/2016  ? [place] 96 06/18/2014  ? ?Lab Results  ?Component Value Date  ? TRIG 242.0 (H) 09/19/2021  ? TRIG 255.0 (H) 04/16/2021  ? TRIG 201.0 (H) 08/13/2020  ? ?Lab Results  ?Component Value Date  ? CHOLHDL 5 09/19/2021  ? CHOLHDL 6 04/16/2021  ? CHOLHDL 5 08/13/2020  ? ?Lab Results  ?Component Value Date  ? LDLDIRECT 94.0 09/19/2021  ? LDLDIRECT 101.0 04/16/2021  ? LDLDIRECT 84.0 08/13/2020  ? ?     ?    ?Blood pressure: He has been not recommended any treatment for hypertension ?Blood pressure is better than usual ? ? ?He is on Jardiance 25 mg  ? ? ?BP Readings from Last 3 Encounters:  ?12/01/21 128/82  ?10/16/21 (!) 140/98  ?08/26/21 132/90  ? ? ?Most recent eye exam was 2022 but report not available ? ?Most recent foot exam: 11/2017 ? ? ? ?LABS: ? ?Lab on 11/26/2021  ?Component Date Value Ref Range Status  ? Sodium 11/26/2021 139  135 - 145 mEq/L Final  ? Potassium 11/26/2021 4.0  3.5 - 5.1 mEq/L Final  ? Chloride 11/26/2021 102  96 - 112 mEq/L Final  ? CO2 11/26/2021 28  19 - 32 mEq/L Final  ? Glucose, Bld 11/26/2021 114 (H)  70 - 99 mg/dL Final  ? BUN 11/26/2021 10  6 - 23 mg/dL Final  ? Creatinine, Ser 11/26/2021 1.12  0.40 - 1.50 mg/dL Final  ? GFR 11/26/2021 81.37  >60.00 mL/min Final  ? Calculated using the CKD-EPI Creatinine Equation (2021)  ? Calcium 11/26/2021 9.9  8.4 - 10.5 mg/dL Final  ? Fructosamine 11/26/2021 266  0 - 285 umol/L Final  ? Comment: Published reference interval for apparently healthy subjects ?between age 25 and 56 is 10 - 285 umol/L and in a poorly ?controlled diabetic population is 228 - 563 umol/L with a ?mean of 396 umol/L. ?  ? ? ?Physical Examination: ? ?BP 128/82   Pulse (!) 103   Ht 5' 11" (1.803 m)   Wt 281 lb (127.5 kg)   SpO2 94%   BMI 39.19 kg/m?  ? ? ?   ?ASSESSMENT: ? ?Diabetes type 2, with  obesity ? ?See history of present illness for detailed discussion of current diabetes management, blood sugar patterns and problems identified ? ?He is on a regimen of basal insulin 88 units, Jardiance 25 mg and Trul

## 2021-12-03 ENCOUNTER — Other Ambulatory Visit: Payer: Self-pay | Admitting: Endocrinology

## 2021-12-03 ENCOUNTER — Other Ambulatory Visit (HOSPITAL_COMMUNITY): Payer: Self-pay

## 2021-12-03 DIAGNOSIS — E1165 Type 2 diabetes mellitus with hyperglycemia: Secondary | ICD-10-CM

## 2021-12-04 ENCOUNTER — Other Ambulatory Visit (HOSPITAL_COMMUNITY): Payer: Self-pay

## 2021-12-05 ENCOUNTER — Other Ambulatory Visit (HOSPITAL_COMMUNITY): Payer: Self-pay

## 2021-12-05 MED ORDER — TRULICITY 4.5 MG/0.5ML ~~LOC~~ SOAJ
4.5000 mg | SUBCUTANEOUS | 2 refills | Status: DC
  Filled 2021-12-05: qty 6, 84d supply, fill #0

## 2021-12-17 ENCOUNTER — Ambulatory Visit (HOSPITAL_BASED_OUTPATIENT_CLINIC_OR_DEPARTMENT_OTHER): Payer: 59

## 2021-12-17 DIAGNOSIS — Z0000 Encounter for general adult medical examination without abnormal findings: Secondary | ICD-10-CM | POA: Diagnosis not present

## 2021-12-18 LAB — COMPREHENSIVE METABOLIC PANEL
ALT: 31 IU/L (ref 0–44)
AST: 25 IU/L (ref 0–40)
Albumin/Globulin Ratio: 1.6 (ref 1.2–2.2)
Albumin: 4.7 g/dL (ref 4.0–5.0)
Alkaline Phosphatase: 88 IU/L (ref 44–121)
BUN/Creatinine Ratio: 8 — ABNORMAL LOW (ref 9–20)
BUN: 9 mg/dL (ref 6–24)
Bilirubin Total: 0.5 mg/dL (ref 0.0–1.2)
CO2: 25 mmol/L (ref 20–29)
Calcium: 10 mg/dL (ref 8.7–10.2)
Chloride: 100 mmol/L (ref 96–106)
Creatinine, Ser: 1.09 mg/dL (ref 0.76–1.27)
Globulin, Total: 2.9 g/dL (ref 1.5–4.5)
Glucose: 94 mg/dL (ref 70–99)
Potassium: 3.9 mmol/L (ref 3.5–5.2)
Sodium: 139 mmol/L (ref 134–144)
Total Protein: 7.6 g/dL (ref 6.0–8.5)
eGFR: 87 mL/min/{1.73_m2} (ref >59)

## 2021-12-18 LAB — CBC WITH DIFFERENTIAL/PLATELET
Basophils Absolute: 0 10*3/uL (ref 0.0–0.2)
Basos: 0 %
EOS (ABSOLUTE): 0.1 10*3/uL (ref 0.0–0.4)
Eos: 2 %
Hematocrit: 41.2 % (ref 37.5–51.0)
Hemoglobin: 13.9 g/dL (ref 13.0–17.7)
Immature Grans (Abs): 0.1 10*3/uL (ref 0.0–0.1)
Immature Granulocytes: 1 %
Lymphocytes Absolute: 2.5 10*3/uL (ref 0.7–3.1)
Lymphs: 38 %
MCH: 27.2 pg (ref 26.6–33.0)
MCHC: 33.7 g/dL (ref 31.5–35.7)
MCV: 81 fL (ref 79–97)
Monocytes Absolute: 0.5 10*3/uL (ref 0.1–0.9)
Monocytes: 8 %
Neutrophils Absolute: 3.3 10*3/uL (ref 1.4–7.0)
Neutrophils: 51 %
Platelets: 381 10*3/uL (ref 150–450)
RBC: 5.11 x10E6/uL (ref 4.14–5.80)
RDW: 13.3 % (ref 11.6–15.4)
WBC: 6.5 10*3/uL (ref 3.4–10.8)

## 2021-12-18 LAB — HEMOGLOBIN A1C
Est. average glucose Bld gHb Est-mCnc: 157 mg/dL
Hgb A1c MFr Bld: 7.1 % — ABNORMAL HIGH (ref 4.8–5.6)

## 2021-12-18 LAB — LIPID PANEL
Chol/HDL Ratio: 6.5 ratio — ABNORMAL HIGH (ref 0.0–5.0)
Cholesterol, Total: 189 mg/dL (ref 100–199)
HDL: 29 mg/dL — ABNORMAL LOW (ref >39)
LDL Chol Calc (NIH): 115 mg/dL — ABNORMAL HIGH (ref 0–99)
Triglycerides: 259 mg/dL — ABNORMAL HIGH (ref 0–149)
VLDL Cholesterol Cal: 45 mg/dL — ABNORMAL HIGH (ref 5–40)

## 2021-12-18 LAB — TSH RFX ON ABNORMAL TO FREE T4: TSH: 0.749 u[IU]/mL (ref 0.450–4.500)

## 2021-12-23 ENCOUNTER — Encounter: Payer: Self-pay | Admitting: Endocrinology

## 2021-12-24 ENCOUNTER — Ambulatory Visit (INDEPENDENT_AMBULATORY_CARE_PROVIDER_SITE_OTHER): Payer: 59 | Admitting: Family Medicine

## 2021-12-24 ENCOUNTER — Encounter (HOSPITAL_BASED_OUTPATIENT_CLINIC_OR_DEPARTMENT_OTHER): Payer: Self-pay | Admitting: Family Medicine

## 2021-12-24 DIAGNOSIS — R030 Elevated blood-pressure reading, without diagnosis of hypertension: Secondary | ICD-10-CM

## 2021-12-24 NOTE — Assessment & Plan Note (Signed)
Blood pressure is elevated in office today.  Prior readings in our office and with specialist have been borderline, better controlled than today's reading Patient does not check blood pressure regularly at home, thinks that his wife does have a blood pressure cuff however.  He denies any current issues with chest pain or headaches, no visual changes Recommend intermittent monitoring of blood pressure at home, DASH diet, provided handout today.  Discussed additional lifestyle modifications as well We will allow patient to work on lifestyle modifications, plan for follow-up in a few months to reassess blood pressure and determine need for pharmacotherapy

## 2021-12-24 NOTE — Progress Notes (Signed)
    Procedures performed today:    None.  Independent interpretation of notes and tests performed by another provider:   None.  Brief History, Exam, Impression, and Recommendations:    BP (!) 150/108   Pulse 85   Ht 6' (1.829 m)   Wt 278 lb (126.1 kg)   SpO2 97%   BMI 37.70 kg/m   Elevated blood pressure reading without diagnosis of hypertension Blood pressure is elevated in office today.  Prior readings in our office and with specialist have been borderline, better controlled than today's reading Patient does not check blood pressure regularly at home, thinks that his wife does have a blood pressure cuff however.  He denies any current issues with chest pain or headaches, no visual changes Recommend intermittent monitoring of blood pressure at home, DASH diet, provided handout today.  Discussed additional lifestyle modifications as well We will allow patient to work on lifestyle modifications, plan for follow-up in a few months to reassess blood pressure and determine need for pharmacotherapy   ___________________________________________ Yuma Pacella de [place], MD, ABFM, CAQSM [facility] [place]

## 2021-12-24 NOTE — Patient Instructions (Signed)
  Medication Instructions:  Your physician recommends that you continue on your current medications as directed. Please refer to the Current Medication list given to you today. --If you need a refill on any your medications before your next appointment, please call your pharmacy first. If no refills are authorized on file call the office.-- Lab Work: Your physician has recommended that you have lab work today: No If you have labs (blood work) drawn today and your tests are completely normal, you will receive your results via MyChart message OR a phone call from our staff.  Please ensure you check your voicemail in the event that you authorized detailed messages to be left on a delegated number. If you have any lab test that is abnormal or we need to change your treatment, we will call you to review the results.  Referrals/Procedures/Imaging: No  Follow-Up: Your next appointment:   Your physician recommends that you schedule a follow-up appointment in 4 months for bp with Dr. de [place].  You will receive a text message or e-mail with a link to a survey about your care and experience with [place] today! We would greatly appreciate your feedback!   Thanks for letting us be apart of your health journey!!  Primary Care and Sports Medicine   Dr. Ceasar Mons [place]   We encourage you to activate your patient portal called "MyChart".  Sign up information is provided on this After Visit Summary.  MyChart is used to connect with patients for Virtual Visits (Telemedicine).  Patients are able to view lab/test results, encounter notes, upcoming appointments, etc.  Non-urgent messages can be sent to your provider as well. To learn more about what you can do with MyChart, please visit --  [email or web address].

## 2022-01-07 ENCOUNTER — Other Ambulatory Visit (HOSPITAL_COMMUNITY): Payer: Self-pay

## 2022-01-07 ENCOUNTER — Other Ambulatory Visit: Payer: Self-pay | Admitting: Endocrinology

## 2022-01-07 DIAGNOSIS — E1165 Type 2 diabetes mellitus with hyperglycemia: Secondary | ICD-10-CM

## 2022-01-07 MED ORDER — TRULICITY 4.5 MG/0.5ML ~~LOC~~ SOAJ
4.5000 mg | SUBCUTANEOUS | 2 refills | Status: DC
  Filled 2022-01-07: qty 2, 28d supply, fill #0
  Filled 2022-02-23: qty 6, 84d supply, fill #0

## 2022-02-02 ENCOUNTER — Other Ambulatory Visit: Payer: Self-pay | Admitting: Endocrinology

## 2022-02-02 ENCOUNTER — Other Ambulatory Visit (HOSPITAL_COMMUNITY): Payer: Self-pay

## 2022-02-04 ENCOUNTER — Other Ambulatory Visit: Payer: Self-pay | Admitting: Endocrinology

## 2022-02-04 ENCOUNTER — Other Ambulatory Visit (HOSPITAL_COMMUNITY): Payer: Self-pay

## 2022-02-04 MED ORDER — FREESTYLE LIBRE 3 SENSOR MISC
1.0000 | 2 refills | Status: DC
  Filled 2022-02-04 – 2022-02-17 (×2): qty 2, 28d supply, fill #0
  Filled 2022-03-16: qty 2, 28d supply, fill #1
  Filled 2022-04-27: qty 2, 28d supply, fill #2

## 2022-02-09 ENCOUNTER — Other Ambulatory Visit (HOSPITAL_COMMUNITY): Payer: Self-pay

## 2022-02-12 ENCOUNTER — Other Ambulatory Visit (HOSPITAL_COMMUNITY): Payer: Self-pay

## 2022-02-16 ENCOUNTER — Other Ambulatory Visit (HOSPITAL_COMMUNITY): Payer: Self-pay

## 2022-02-16 MED ORDER — INSULIN PEN NEEDLE 31G X 6 MM MISC
0 refills | Status: AC
  Filled 2022-02-16: qty 100, 25d supply, fill #0

## 2022-02-17 ENCOUNTER — Other Ambulatory Visit (HOSPITAL_COMMUNITY): Payer: Self-pay

## 2022-02-23 ENCOUNTER — Other Ambulatory Visit (HOSPITAL_COMMUNITY): Payer: Self-pay

## 2022-03-03 ENCOUNTER — Other Ambulatory Visit (INDEPENDENT_AMBULATORY_CARE_PROVIDER_SITE_OTHER): Payer: 59

## 2022-03-03 DIAGNOSIS — Z794 Long term (current) use of insulin: Secondary | ICD-10-CM

## 2022-03-03 DIAGNOSIS — E782 Mixed hyperlipidemia: Secondary | ICD-10-CM

## 2022-03-03 DIAGNOSIS — E1165 Type 2 diabetes mellitus with hyperglycemia: Secondary | ICD-10-CM

## 2022-03-03 LAB — HEMOGLOBIN A1C: Hgb A1c MFr Bld: 8.1 % — ABNORMAL HIGH (ref 4.6–6.5)

## 2022-03-03 LAB — COMPREHENSIVE METABOLIC PANEL
ALT: 24 U/L (ref 0–53)
AST: 20 U/L (ref 0–37)
Albumin: 4.7 g/dL (ref 3.5–5.2)
Alkaline Phosphatase: 78 U/L (ref 39–117)
BUN: 11 mg/dL (ref 6–23)
CO2: 27 mEq/L (ref 19–32)
Calcium: 9.9 mg/dL (ref 8.4–10.5)
Chloride: 102 mEq/L (ref 96–112)
Creatinine, Ser: 1.04 mg/dL (ref 0.40–1.50)
GFR: 88.77 mL/min (ref >60.00)
Glucose, Bld: 102 mg/dL — ABNORMAL HIGH (ref 70–99)
Potassium: 4.2 mEq/L (ref 3.5–5.1)
Sodium: 138 mEq/L (ref 135–145)
Total Bilirubin: 0.5 mg/dL (ref 0.2–1.2)
Total Protein: 8 g/dL (ref 6.0–8.3)

## 2022-03-03 LAB — LIPID PANEL
Cholesterol: 191 mg/dL (ref 0–200)
HDL: 35 mg/dL — ABNORMAL LOW (ref >39.00)
NonHDL: 156.05
Total CHOL/HDL Ratio: 5
Triglycerides: 277 mg/dL — ABNORMAL HIGH (ref 0.0–149.0)
VLDL: 55.4 mg/dL — ABNORMAL HIGH (ref 0.0–40.0)

## 2022-03-03 LAB — MICROALBUMIN / CREATININE URINE RATIO
Creatinine,U: 215.7 mg/dL
Microalb Creat Ratio: 1.2 mg/g (ref 0.0–30.0)
Microalb, Ur: 2.6 mg/dL — ABNORMAL HIGH (ref 0.0–1.9)

## 2022-03-03 LAB — LDL CHOLESTEROL, DIRECT: Direct LDL: 82 mg/dL

## 2022-03-05 ENCOUNTER — Ambulatory Visit (INDEPENDENT_AMBULATORY_CARE_PROVIDER_SITE_OTHER): Payer: 59 | Admitting: Endocrinology

## 2022-03-05 ENCOUNTER — Other Ambulatory Visit (HOSPITAL_COMMUNITY): Payer: Self-pay

## 2022-03-05 ENCOUNTER — Encounter: Payer: Self-pay | Admitting: Endocrinology

## 2022-03-05 VITALS — BP 122/74 | HR 76 | Ht 72.0 in | Wt 283.6 lb

## 2022-03-05 DIAGNOSIS — E782 Mixed hyperlipidemia: Secondary | ICD-10-CM | POA: Diagnosis not present

## 2022-03-05 DIAGNOSIS — Z794 Long term (current) use of insulin: Secondary | ICD-10-CM | POA: Diagnosis not present

## 2022-03-05 DIAGNOSIS — E1165 Type 2 diabetes mellitus with hyperglycemia: Secondary | ICD-10-CM | POA: Diagnosis not present

## 2022-03-05 MED ORDER — FENOFIBRATE 145 MG PO TABS
145.0000 mg | ORAL_TABLET | Freq: Every day | ORAL | 1 refills | Status: DC
  Filled 2022-03-05: qty 90, 90d supply, fill #0

## 2022-03-05 NOTE — Progress Notes (Signed)
Patient ID: Roy Jennings, male   DOB: 19-Jul-1981, 41 y.o.   MRN: [id number]            Reason for Appointment: Follow-up for Type 2 Diabetes    History of Present Illness:          Date of diagnosis of type 2 diabetes mellitus: 12/2011       Background history:   He was initially diagnosed with marked hyperglycemia and A1c of 13.4% He was apparently started on insulin at onset and has taken various insulin regimens in the past He has had difficulty tolerating metformin and has not taken this most of the time He has probably been on Lantus since about 08/2015, previously on the 70/30 insulin His level of control had improved initially in 2014 and 2015  Recent history:   INSULIN regimen is: 44 units generic glargine bid  Non-insulin hypoglycemic drugs the patient is taking are:  Jardiance 25 mg in am, Trulicity 4.5 weekly   Current management, blood sugar patterns and problems identified:  His A1c is 8.1 and higher   He has tried the [employer] but he says the sensor keeps falling off and recent download shows minimal data for the last 2 weeks Not remember what his blood sugars are at home, however lab fasting glucose was 102 However he gets up in the morning around 4:30 AM and his lab was drawn around 8:30 AM after taking his insulin on waking up Usually not eating breakfast Not clear why is not taking Jardiance regularly, has no side effects with this and no difficulty with insurance coverage  Taking Trulicity regularly He does try to watch his portions but has gained 2 pounds Review of his sensor data over the last month or so indicates that he has mostly higher readings after dinner with average for the 3-hour in the interval of 203 Lowest blood sugars are before lunch and averaging 154 No hypoglycemia reported blood sugars tend to be lower before his first meal He does try to walk 3 miles every morning        Dinner usually at 10 pm   Side effects from  medications have been: Diarrhea from most doses of metformin   FREESTYLE libre 3 data between 4/11 and 4/24:  CGM use % of time 70  2-week average/GV 133 19  Time in range      96%  % Time Above 180 4  % Time above 250   % Time Below 70      PRE-MEAL Fasting Lunch Dinner Bedtime Overall  Glucose range:       Averages: 140 113 133  133   POST-MEAL PC Breakfast PC Lunch PC Dinner  Glucose range:     Averages:  130 146     Self-care: The diet that the patient has been following is: None   Typical meal intake: Variable        Dietician visit, most recent: 08/2015, diabetes educator in 08/2018                Weight history:   Wt Readings from Last 3 Encounters:  03/05/22 283 lb 9.6 oz (128.6 kg)  12/24/21 278 lb (126.1 kg)  12/01/21 281 lb (127.5 kg)    Glycemic control:   Lab Results  Component Value Date   HGBA1C 8.1 (H) 03/03/2022   HGBA1C 7.1 (H) 12/17/2021   HGBA1C 8.3 (H) 09/19/2021   Lab Results  Component Value Date   MICROALBUR  2.6 (H) 03/03/2022   LDLCALC 115 (H) 12/17/2021   CREATININE 1.04 03/03/2022   Lab Results  Component Value Date   MICRALBCREAT 1.2 03/03/2022    Lab Results  Component Value Date   FRUCTOSAMINE 266 11/26/2021   FRUCTOSAMINE 283 08/31/2018   FRUCTOSAMINE 292 (H) 03/23/2018   Other active problems: See review of systems   Allergies as of 03/05/2022       Reactions   Metformin And Related    Diarrhea, nausea        Medication List        Accurate as of March 05, 2022  3:19 PM. If you have any questions, ask your nurse or doctor.          fenofibrate 145 MG tablet Commonly known as: Tricor Take 1 tablet (145 mg total) by mouth daily. Started by: Elayne Snare, MD   fexofenadine 180 MG tablet Commonly known as: ALLEGRA Take 1 tablet (180 mg total) by mouth daily.   FreeStyle Freedom Lite w/Device Kit Use as instructed to check blood sugar twice daily.   freestyle lancets 1 each by Other route 2 (two)  times daily. for testing   FreeStyle Libre 3 Sensor Misc Apply 1 sensor on upper arm every 14 days for continuous glucose monitoring   glucose blood test strip Commonly known as: FREESTYLE LITE Use as instructed to check blood sugar twice daily.   HumaLOG KwikPen 100 UNIT/ML KwikPen Generic drug: insulin lispro Inject 10 Units into the skin 3 (three) times daily before meals What changed: additional instructions   Insulin Pen Needle 31G X 8 MM Misc Commonly known as: Unifine Pentips USE TO INJECT INSULIN 1-3 TIMES INTO SKIN DAILY   Unifine Pentips 31G X 6 MM Misc Generic drug: Insulin Pen Needle Use as directed with insulin.   Jardiance 25 MG Tabs tablet Generic drug: empagliflozin Take 1 tablet (25 mg total) by mouth daily.   Semglee (yfgn) 100 UNIT/ML Pen Generic drug: insulin glargine-yfgn Inject 44 units under the skin twice a day   simvastatin 20 MG tablet Commonly known as: ZOCOR Take 1 tablet (20 mg total) by mouth at bedtime.   triamcinolone 55 MCG/ACT Aero nasal inhaler Commonly known as: NASACORT Place 2 sprays into the nose daily.   Trulicity 4.5 WU/9.8JX Sopn Generic drug: Dulaglutide Inject 4.5 mg into the skin once a week.        Allergies:  Allergies  Allergen Reactions   Metformin And Related     Diarrhea, nausea    Past Medical History:  Diagnosis Date   Diabetes mellitus 12/30/11   DKA (diabetic ketoacidoses) 12/30/11   new dx DM a1c 13   PVC (premature ventricular contraction)    LBB inferior axis QRS // followed by [name]   Right bundle branch block     Past Surgical History:  Procedure Laterality Date   KNEE ARTHROSCOPY  1999-2000   bilateral    Family History  Problem Relation Age of Onset   Diabetes Father    Diabetes Maternal Grandmother    Hypertension Mother    CAD Neg Hx     Social History:  reports that he has never smoked. He has never used smokeless tobacco. He reports current alcohol use. He reports  that he does not use drugs.   Review of Systems   Hyperlipidemia:  He is on simvastatin as a statin drug, LDL is under 100 His triglycerides are consistently over 200   Lab Results  Component  Value Date   CHOL 191 03/03/2022   CHOL 189 12/17/2021   CHOL 198 09/19/2021   Lab Results  Component Value Date   HDL 35.00 (L) 03/03/2022   HDL 29 (L) 12/17/2021   HDL 38.40 (L) 09/19/2021   Lab Results  Component Value Date   LDLCALC 115 (H) 12/17/2021   LDLCALC 83 04/28/2017   LDLCALC 124 (H) 08/28/2016   Lab Results  Component Value Date   TRIG 277.0 (H) 03/03/2022   TRIG 259 (H) 12/17/2021   TRIG 242.0 (H) 09/19/2021   Lab Results  Component Value Date   CHOLHDL 5 03/03/2022   CHOLHDL 6.5 (H) 12/17/2021   CHOLHDL 5 09/19/2021   Lab Results  Component Value Date   LDLDIRECT 82.0 03/03/2022   LDLDIRECT 94.0 09/19/2021   LDLDIRECT 101.0 04/16/2021            Blood pressure: He has been not recommended any treatment for hypertension Blood pressure is normal on second measurement, high on the first   BP Readings from Last 3 Encounters:  03/05/22 122/74  12/24/21 (!) 150/108  12/01/21 128/82    Most recent eye exam was 2022 but report not available  Most recent foot exam: 03/2022    LABS:  Lab on 03/03/2022  Component Date Value Ref Range Status   Microalb, Ur 03/03/2022 2.6 (H)  0.0 - 1.9 mg/dL Final   Creatinine,U 03/03/2022 215.7  mg/dL Final   Microalb Creat Ratio 03/03/2022 1.2  0.0 - 30.0 mg/g Final   Cholesterol 03/03/2022 191  0 - 200 mg/dL Final   ATP III Classification       Desirable:  < 200 mg/dL               Borderline High:  200 - 239 mg/dL          High:  > = 240 mg/dL   Triglycerides 03/03/2022 277.0 (H)  0.0 - 149.0 mg/dL Final   Normal:  <150 mg/dLBorderline High:  150 - 199 mg/dL   HDL 03/03/2022 35.00 (L)  >39.00 mg/dL Final   VLDL 03/03/2022 55.4 (H)  0.0 - 40.0 mg/dL Final   Total CHOL/HDL Ratio 03/03/2022 5   Final                   Men          Women1/2 Average Risk     3.4          3.3Average Risk          5.0          4.42X Average Risk          9.6          7.13X Average Risk          15.0          11.0                       NonHDL 03/03/2022 156.05   Final   NOTE:  Non-HDL goal should be 30 mg/dL higher than patient's LDL goal (i.e. LDL goal of < 70 mg/dL, would have non-HDL goal of < 100 mg/dL)   Sodium 03/03/2022 138  135 - 145 mEq/L Final   Potassium 03/03/2022 4.2  3.5 - 5.1 mEq/L Final   Chloride 03/03/2022 102  96 - 112 mEq/L Final   CO2 03/03/2022 27  19 - 32 mEq/L Final   Glucose, Bld 03/03/2022 102 (H)  70 - 99 mg/dL Final   BUN 03/03/2022 11  6 - 23 mg/dL Final   Creatinine, Ser 03/03/2022 1.04  0.40 - 1.50 mg/dL Final   Total Bilirubin 03/03/2022 0.5  0.2 - 1.2 mg/dL Final   Alkaline Phosphatase 03/03/2022 78  39 - 117 U/L Final   AST 03/03/2022 20  0 - 37 U/L Final   ALT 03/03/2022 24  0 - 53 U/L Final   Total Protein 03/03/2022 8.0  6.0 - 8.3 g/dL Final   Albumin 03/03/2022 4.7  3.5 - 5.2 g/dL Final   GFR 03/03/2022 88.77  >60.00 mL/min Final   Calculated using the CKD-EPI Creatinine Equation (2021)   Calcium 03/03/2022 9.9  8.4 - 10.5 mg/dL Final   Hgb A1c MFr Bld 03/03/2022 8.1 (H)  4.6 - 6.5 % Final   Glycemic Control Guidelines for People with Diabetes:Non Diabetic:  <6%Goal of Therapy: <7%Additional Action Suggested:  >8%    Direct LDL 03/03/2022 82.0  mg/dL Final   Optimal:  <100 mg/dLNear or Above Optimal:  100-129 mg/dLBorderline High:  130-159 mg/dLHigh:  160-189 mg/dLVery High:  >190 mg/dL    Physical Examination:  BP 122/74   Pulse 76   Ht 6' (1.829 m)   Wt 283 lb 9.6 oz (128.6 kg)   SpO2 98%   BMI 38.46 kg/m   Diabetic Foot Exam - Simple   Simple Foot Form Diabetic Foot exam was performed with the following findings: Yes   Visual Inspection No deformities, no ulcerations, no other skin breakdown bilaterally: Yes Sensation Testing Intact to touch and monofilament testing  bilaterally: Yes Pulse Check Posterior Tibialis and Dorsalis pulse intact bilaterally: Yes Comments     No pedal edema  ASSESSMENT:  Diabetes type 2, with obesity  See history of present illness for detailed discussion of current diabetes management, blood sugar patterns and problems identified  He is on a regimen of basal insulin 88 units, Jardiance 25 mg and Trulicity 4.5 mg  His K9X is 8.1  Last but his fructosamine is improved significantly  As before his compliance is somewhat variable Has not taken Jardiance regularly Still has difficulty losing weight Recently he is not able to keep his sensor consistently and minimal data available except more than 2 to 3 weeks ago Has usually postprandial hyperglycemia after dinner and since he takes his Lantus early morning without breakfast blood sugars trending lower in the morning hours  Hyperlipidemia: He has high triglycerides and not at goal with simvastatin  Variable blood pressure: Appears to have whitecoat syndrome No microalbuminuria  PLAN:   Needs to do use of clear tape to keep the freestyle libre sensor on consistently  Discussed blood sugar targets fasting and after meals  He will wait until at least 8 AM to take his Lantus history of 4:30 in the morning before he leaves for work  Try taking 40 units in the morning and 50 at suppertime to help with overnight readings  Start taking 10 to 15 units of Humalog at suppertime based on his meal size and carbohydrates  Take Jardiance regularly  Avoid large amounts of carbohydrate or high fat foods Continue regular walking   Start FENOFIBRATE for his lipids Continue simvastatin  Patient Instructions  Humalog 10 -15 at supper   Lantus 40 in am at 8 am and 50 at supper  Jardiance daily    Elayne Snare 03/05/2022, 3:19 PM   Note: This office note was prepared with Dragon voice recognition system technology. Any  transcriptional errors that result from this process are  unintentional.

## 2022-03-05 NOTE — Patient Instructions (Addendum)
Humalog 10 -15 at supper   Lantus 40 in am at 8 am and 50 at supper  Jardiance daily

## 2022-03-16 ENCOUNTER — Other Ambulatory Visit (HOSPITAL_COMMUNITY): Payer: Self-pay

## 2022-03-25 ENCOUNTER — Other Ambulatory Visit: Payer: Self-pay | Admitting: Endocrinology

## 2022-03-25 ENCOUNTER — Other Ambulatory Visit (HOSPITAL_COMMUNITY): Payer: Self-pay

## 2022-03-25 DIAGNOSIS — Z794 Long term (current) use of insulin: Secondary | ICD-10-CM

## 2022-03-25 MED ORDER — INSULIN GLARGINE-YFGN 100 UNIT/ML ~~LOC~~ SOPN
44.0000 [IU] | PEN_INJECTOR | Freq: Two times a day (BID) | SUBCUTANEOUS | 2 refills | Status: DC
  Filled 2022-03-25: qty 30, 34d supply, fill #0
  Filled 2022-05-04: qty 30, 34d supply, fill #1
  Filled 2022-06-16: qty 30, 34d supply, fill #2

## 2022-03-26 ENCOUNTER — Other Ambulatory Visit (HOSPITAL_COMMUNITY): Payer: Self-pay

## 2022-04-27 ENCOUNTER — Other Ambulatory Visit (HOSPITAL_COMMUNITY): Payer: Self-pay

## 2022-04-27 ENCOUNTER — Ambulatory Visit (INDEPENDENT_AMBULATORY_CARE_PROVIDER_SITE_OTHER): Payer: 59 | Admitting: Family Medicine

## 2022-04-27 ENCOUNTER — Encounter (HOSPITAL_BASED_OUTPATIENT_CLINIC_OR_DEPARTMENT_OTHER): Payer: Self-pay | Admitting: Family Medicine

## 2022-04-27 DIAGNOSIS — R030 Elevated blood-pressure reading, without diagnosis of hypertension: Secondary | ICD-10-CM | POA: Diagnosis not present

## 2022-04-27 DIAGNOSIS — Z794 Long term (current) use of insulin: Secondary | ICD-10-CM | POA: Diagnosis not present

## 2022-04-27 DIAGNOSIS — Z0000 Encounter for general adult medical examination without abnormal findings: Secondary | ICD-10-CM

## 2022-04-27 DIAGNOSIS — E119 Type 2 diabetes mellitus without complications: Secondary | ICD-10-CM

## 2022-04-27 NOTE — Progress Notes (Signed)
    Procedures performed today:    None.  Independent interpretation of notes and tests performed by another provider:   None.  Brief History, Exam, Impression, and Recommendations:    BP (!) 128/92   Pulse 95   Ht 6' (1.829 m)   Wt 271 lb 3.2 oz (123 kg)   SpO2 100%   BMI 36.78 kg/m   Diabetes (HCC) Patient continues to follow with Dr. Dwyane Dee of endocrinology.  No recent changes to current regimen.  He does continue with Trulicity, Jardiance, insulin glargine.  He does have mealtime insulin, however reports that he has not been needing this typically No concerns at this time, will be completing labs with endocrinology in the near future for monitoring  Elevated blood pressure reading without diagnosis of hypertension Blood pressure is better controlled in office today as compared to last office visit.  Since last office visit with me he has also seen endocrinology and blood pressure reading in their office was at goal He has not been checking blood pressure at home.  No current issues with chest pain or headaches Recommend intermittent monitoring of blood pressure at home, cannot use home blood pressure cuff or use machine at local grocery store pharmacy We will also continue monitoring blood pressure here in the office Recommend DASH diet, handout provided  Return in about 4 months (around 08/27/2022) for CPE with [facility] a few days prior. Recommend seasonal influenza vaccine, patient declines today   ___________________________________________ Omeka Holben de [place], MD, ABFM, CAQSM [facility] [place]

## 2022-04-27 NOTE — Patient Instructions (Signed)
  Medication Instructions:  Your physician recommends that you continue on your current medications as directed. Please refer to the Current Medication list given to you today. --If you need a refill on any your medications before your next appointment, please call your pharmacy first. If no refills are authorized on file call the office.-- Lab Work: Your physician has recommended that you have lab work today: No If you have labs (blood work) drawn today and your tests are completely normal, you will receive your results via [facility] a phone call from our staff.  Please ensure you check your voicemail in the event that you authorized detailed messages to be left on a delegated number. If you have any lab test that is abnormal or we need to change your treatment, we will call you to review the results.  Referrals/Procedures/Imaging: No  Follow-Up: Your next appointment:   Your physician recommends that you schedule a follow-up appointment in: 4 month follow-up with Dr. de [place].  You will receive a text message or e-mail with a link to a survey about your care and experience with [place] today! We would greatly appreciate your feedback!   Thanks for letting us be apart of your health journey!!  Primary Care and Sports Medicine   Dr. Arlina Robes [place]   We encourage you to activate your patient portal called "MyChart".  Sign up information is provided on this After Visit Summary.  MyChart is used to connect with patients for Virtual Visits (Telemedicine).  Patients are able to view lab/test results, encounter notes, upcoming appointments, etc.  Non-urgent messages can be sent to your provider as well. To learn more about what you can do with MyChart, please visit --  [email or web address].

## 2022-04-27 NOTE — Assessment & Plan Note (Signed)
Blood pressure is better controlled in office today as compared to last office visit.  Since last office visit with me he has also seen endocrinology and blood pressure reading in their office was at goal He has not been checking blood pressure at home.  No current issues with chest pain or headaches Recommend intermittent monitoring of blood pressure at home, cannot use home blood pressure cuff or use machine at local grocery store pharmacy We will also continue monitoring blood pressure here in the office Recommend DASH diet, handout provided

## 2022-04-27 NOTE — Assessment & Plan Note (Signed)
Patient continues to follow with Dr. Dwyane Dee of endocrinology.  No recent changes to current regimen.  He does continue with Trulicity, Jardiance, insulin glargine.  He does have mealtime insulin, however reports that he has not been needing this typically No concerns at this time, will be completing labs with endocrinology in the near future for monitoring

## 2022-04-28 ENCOUNTER — Other Ambulatory Visit (HOSPITAL_COMMUNITY): Payer: Self-pay

## 2022-05-04 ENCOUNTER — Other Ambulatory Visit (HOSPITAL_COMMUNITY): Payer: Self-pay

## 2022-05-04 ENCOUNTER — Other Ambulatory Visit: Payer: Self-pay | Admitting: Endocrinology

## 2022-05-04 DIAGNOSIS — E1165 Type 2 diabetes mellitus with hyperglycemia: Secondary | ICD-10-CM

## 2022-05-04 MED ORDER — EMPAGLIFLOZIN 25 MG PO TABS
25.0000 mg | ORAL_TABLET | Freq: Every day | ORAL | 2 refills | Status: DC
  Filled 2022-05-04: qty 30, 30d supply, fill #0

## 2022-05-04 MED ORDER — TRULICITY 4.5 MG/0.5ML ~~LOC~~ SOAJ
4.5000 mg | SUBCUTANEOUS | 2 refills | Status: DC
  Filled 2022-05-04: qty 6, 84d supply, fill #0

## 2022-05-25 ENCOUNTER — Other Ambulatory Visit (INDEPENDENT_AMBULATORY_CARE_PROVIDER_SITE_OTHER): Payer: 59

## 2022-05-25 DIAGNOSIS — E1165 Type 2 diabetes mellitus with hyperglycemia: Secondary | ICD-10-CM | POA: Diagnosis not present

## 2022-05-25 DIAGNOSIS — Z794 Long term (current) use of insulin: Secondary | ICD-10-CM | POA: Diagnosis not present

## 2022-05-25 DIAGNOSIS — E782 Mixed hyperlipidemia: Secondary | ICD-10-CM | POA: Diagnosis not present

## 2022-05-25 LAB — COMPREHENSIVE METABOLIC PANEL
ALT: 23 U/L (ref 0–53)
AST: 20 U/L (ref 0–37)
Albumin: 4.5 g/dL (ref 3.5–5.2)
Alkaline Phosphatase: 73 U/L (ref 39–117)
BUN: 12 mg/dL (ref 6–23)
CO2: 29 mEq/L (ref 19–32)
Calcium: 9.8 mg/dL (ref 8.4–10.5)
Chloride: 102 mEq/L (ref 96–112)
Creatinine, Ser: 1.03 mg/dL (ref 0.40–1.50)
GFR: 89.66 mL/min (ref >60.00)
Glucose, Bld: 99 mg/dL (ref 70–99)
Potassium: 3.9 mEq/L (ref 3.5–5.1)
Sodium: 138 mEq/L (ref 135–145)
Total Bilirubin: 0.5 mg/dL (ref 0.2–1.2)
Total Protein: 8.1 g/dL (ref 6.0–8.3)

## 2022-05-25 LAB — LIPID PANEL
Cholesterol: 188 mg/dL (ref 0–200)
HDL: 36.9 mg/dL — ABNORMAL LOW (ref >39.00)
LDL Cholesterol: 115 mg/dL — ABNORMAL HIGH (ref 0–99)
NonHDL: 150.7
Total CHOL/HDL Ratio: 5
Triglycerides: 178 mg/dL — ABNORMAL HIGH (ref 0.0–149.0)
VLDL: 35.6 mg/dL (ref 0.0–40.0)

## 2022-05-25 LAB — HEMOGLOBIN A1C: Hgb A1c MFr Bld: 6.7 % — ABNORMAL HIGH (ref 4.6–6.5)

## 2022-05-27 NOTE — Progress Notes (Incomplete)
Patient ID: Roy Jennings, male   DOB: [birth year], 41 y.o.   MRN: [id number]            Reason for Appointment: Follow-up for Type 2 Diabetes    History of Present Illness:          Date of diagnosis of type 2 diabetes mellitus: 12/2011       Background history:   He was initially diagnosed with marked hyperglycemia and A1c of 13.4% He was apparently started on insulin at onset and has taken various insulin regimens in the past He has had difficulty tolerating metformin and has not taken this most of the time He has probably been on Lantus since about 08/2015, previously on the 70/30 insulin His level of control had improved initially in 2014 and 2015  Recent history:   INSULIN regimen is: 44 units generic glargine bid  Non-insulin hypoglycemic drugs the patient is taking are:  Jardiance 25 mg in am, Trulicity 4.5 weekly   Current management, blood sugar patterns and problems identified:  His A1c is 8.1 and higher   He has tried the [employer] but he says the sensor keeps falling off and recent download shows minimal data for the last 2 weeks Not remember what his blood sugars are at home, however lab fasting glucose was 102 However he gets up in the morning around 4:30 AM and his lab was drawn around 8:30 AM after taking his insulin on waking up Usually not eating breakfast Not clear why is not taking Jardiance regularly, has no side effects with this and no difficulty with insurance coverage  Taking Trulicity regularly He does try to watch his portions but has gained 2 pounds Review of his sensor data over the last month or so indicates that he has mostly higher readings after dinner with average for the 3-hour in the interval of 203 Lowest blood sugars are before lunch and averaging 154 No hypoglycemia reported blood sugars tend to be lower before his first meal He does try to walk 3 miles every morning        Dinner usually at 10 pm   Side effects from  medications have been: Diarrhea from most doses of metformin   FREESTYLE libre 3 data between 4/11 and 4/24:  CGM use % of time 70  2-week average/GV 133 19  Time in range      96%  % Time Above 180 4  % Time above 250   % Time Below 70      PRE-MEAL Fasting Lunch Dinner Bedtime Overall  Glucose range:       Averages: 140 113 133  133   POST-MEAL PC Breakfast PC Lunch PC Dinner  Glucose range:     Averages:  130 146     Self-care: The diet that the patient has been following is: None   Typical meal intake: Variable        Dietician visit, most recent: 08/2015, diabetes educator in 08/2018                Weight history:   Wt Readings from Last 3 Encounters:  04/27/22 271 lb 3.2 oz (123 kg)  03/05/22 283 lb 9.6 oz (128.6 kg)  12/24/21 278 lb (126.1 kg)    Glycemic control:   Lab Results  Component Value Date   HGBA1C 6.7 Repeated and verified X2. (H) 05/25/2022   HGBA1C 8.1 (H) 03/03/2022   HGBA1C 7.1 (H) 12/17/2021   Lab Results  Component Value Date   MICROALBUR 2.6 (H) 03/03/2022   LDLCALC 115 (H) 05/25/2022   CREATININE 1.03 05/25/2022   Lab Results  Component Value Date   MICRALBCREAT 1.2 03/03/2022    Lab Results  Component Value Date   FRUCTOSAMINE 266 11/26/2021   FRUCTOSAMINE 283 08/31/2018   FRUCTOSAMINE 292 (H) 03/23/2018   Other active problems: See review of systems   Allergies as of 05/28/2022       Reactions   Metformin And Related    Diarrhea, nausea        Medication List        Accurate as of May 27, 2022  8:26 PM. If you have any questions, ask your nurse or doctor.          fenofibrate 145 MG tablet Commonly known as: Tricor Take 1 tablet (145 mg total) by mouth daily.   fexofenadine 180 MG tablet Commonly known as: ALLEGRA Take 1 tablet (180 mg total) by mouth daily.   FreeStyle Freedom Lite w/Device Kit Use as instructed to check blood sugar twice daily.   freestyle lancets 1 each by Other route 2  (two) times daily. for testing   FreeStyle Libre 3 Sensor Misc Apply 1 sensor on upper arm every 14 days for continuous glucose monitoring   glucose blood test strip Commonly known as: FREESTYLE LITE Use as instructed to check blood sugar twice daily.   HumaLOG KwikPen 100 UNIT/ML KwikPen Generic drug: insulin lispro Inject 10 Units into the skin 3 (three) times daily before meals What changed: additional instructions   Insulin Pen Needle 31G X 8 MM Misc Commonly known as: Unifine Pentips USE TO INJECT INSULIN 1-3 TIMES INTO SKIN DAILY   Unifine Pentips 31G X 6 MM Misc Generic drug: Insulin Pen Needle Use as directed with insulin.   Jardiance 25 MG Tabs tablet Generic drug: empagliflozin Take 1 tablet (25 mg total) by mouth daily.   Semglee (yfgn) 100 UNIT/ML Pen Generic drug: insulin glargine-yfgn Inject 44 Units into the skin 2 (two) times daily.   simvastatin 20 MG tablet Commonly known as: ZOCOR Take 1 tablet (20 mg total) by mouth at bedtime.   triamcinolone 55 MCG/ACT Aero nasal inhaler Commonly known as: NASACORT Place 2 sprays into the nose daily.   Trulicity 4.5 TK/3.5WS Sopn Generic drug: Dulaglutide Inject 4.5 mg into the skin once a week.        Allergies:  Allergies  Allergen Reactions  . Metformin And Related     Diarrhea, nausea    Past Medical History:  Diagnosis Date  . Diabetes mellitus 12/30/11  . DKA (diabetic ketoacidoses) 12/30/11   new dx DM a1c 13  . PVC (premature ventricular contraction)    LBB inferior axis QRS // followed by [name]  . Right bundle branch block     Past Surgical History:  Procedure Laterality Date  . KNEE ARTHROSCOPY  1999-2000   bilateral    Family History  Problem Relation Age of Onset  . Diabetes Father   . Diabetes Maternal Grandmother   . Hypertension Mother   . CAD Neg Hx     Social History:  reports that he has never smoked. He has never used smokeless tobacco. He reports current  alcohol use. He reports that he does not use drugs.   Review of Systems   Hyperlipidemia:  He is on simvastatin as a statin drug, LDL is under 100 His triglycerides are consistently over 200   Lab Results  Component Value Date   CHOL 188 05/25/2022   CHOL 191 03/03/2022   CHOL 189 12/17/2021   Lab Results  Component Value Date   HDL 36.90 (L) 05/25/2022   HDL 35.00 (L) 03/03/2022   HDL 29 (L) 12/17/2021   Lab Results  Component Value Date   LDLCALC 115 (H) 05/25/2022   LDLCALC 115 (H) 12/17/2021   LDLCALC 83 04/28/2017   Lab Results  Component Value Date   TRIG 178.0 (H) 05/25/2022   TRIG 277.0 (H) 03/03/2022   TRIG 259 (H) 12/17/2021   Lab Results  Component Value Date   CHOLHDL 5 05/25/2022   CHOLHDL 5 03/03/2022   CHOLHDL 6.5 (H) 12/17/2021   Lab Results  Component Value Date   LDLDIRECT 82.0 03/03/2022   LDLDIRECT 94.0 09/19/2021   LDLDIRECT 101.0 04/16/2021            Blood pressure: He has been not recommended any treatment for hypertension Blood pressure is normal on second measurement, high on the first   BP Readings from Last 3 Encounters:  04/27/22 (!) 128/92  03/05/22 122/74  12/24/21 (!) 150/108    Most recent eye exam was 2022 but report not available  Most recent foot exam: 03/2022    LABS:  Lab on 05/25/2022  Component Date Value Ref Range Status  . Cholesterol 05/25/2022 188  0 - 200 mg/dL Final   ATP III Classification       Desirable:  < 200 mg/dL               Borderline High:  200 - 239 mg/dL          High:  > = 240 mg/dL  . Triglycerides 05/25/2022 178.0 (H)  0.0 - 149.0 mg/dL Final   Normal:  <150 mg/dLBorderline High:  150 - 199 mg/dL  . HDL 05/25/2022 36.90 (L)  >39.00 mg/dL Final  . VLDL 05/25/2022 35.6  0.0 - 40.0 mg/dL Final  . LDL Cholesterol 05/25/2022 115 (H)  0 - 99 mg/dL Final  . Total CHOL/HDL Ratio 05/25/2022 5   Final                  Men          Women1/2 Average Risk     3.4          3.3Average Risk           5.0          4.42X Average Risk          9.6          7.13X Average Risk          15.0          11.0                      . NonHDL 05/25/2022 150.70   Final   NOTE:  Non-HDL goal should be 30 mg/dL higher than patient's LDL goal (i.e. LDL goal of < 70 mg/dL, would have non-HDL goal of < 100 mg/dL)  . Sodium 05/25/2022 138  135 - 145 mEq/L Final  . Potassium 05/25/2022 3.9  3.5 - 5.1 mEq/L Final  . Chloride 05/25/2022 102  96 - 112 mEq/L Final  . CO2 05/25/2022 29  19 - 32 mEq/L Final  . Glucose, Bld 05/25/2022 99  70 - 99 mg/dL Final  . BUN 05/25/2022 12  6 - 23 mg/dL Final  . Creatinine, Ser 05/25/2022  1.03  0.40 - 1.50 mg/dL Final  . Total Bilirubin 05/25/2022 0.5  0.2 - 1.2 mg/dL Final  . Alkaline Phosphatase 05/25/2022 73  39 - 117 U/L Final  . AST 05/25/2022 20  0 - 37 U/L Final  . ALT 05/25/2022 23  0 - 53 U/L Final  . Total Protein 05/25/2022 8.1  6.0 - 8.3 g/dL Final  . Albumin 05/25/2022 4.5  3.5 - 5.2 g/dL Final  . GFR 05/25/2022 89.66  >60.00 mL/min Final   Calculated using the CKD-EPI Creatinine Equation (2021)  . Calcium 05/25/2022 9.8  8.4 - 10.5 mg/dL Final  . Hgb A1c MFr Bld 05/25/2022 6.7 Repeated and verified X2. (H)  4.6 - 6.5 % Final   Glycemic Control Guidelines for People with Diabetes:Non Diabetic:  <6%Goal of Therapy: <7%Additional Action Suggested:  >8%     Physical Examination:  There were no vitals taken for this visit.  Diabetic Foot Exam - Simple   No data filed     No pedal edema  ASSESSMENT:  Diabetes type 2, with obesity  See history of present illness for detailed discussion of current diabetes management, blood sugar patterns and problems identified  He is on a regimen of basal insulin 88 units, Jardiance 25 mg and Trulicity 4.5 mg  His Q8G is 8.1  Last but his fructosamine is improved significantly  As before his compliance is somewhat variable Has not taken Jardiance regularly Still has difficulty losing weight Recently he is  not able to keep his sensor consistently and minimal data available except more than 2 to 3 weeks ago Has usually postprandial hyperglycemia after dinner and since he takes his Lantus early morning without breakfast blood sugars trending lower in the morning hours  Hyperlipidemia: He has high triglycerides and not at goal with simvastatin  Variable blood pressure: Appears to have whitecoat syndrome No microalbuminuria  PLAN:   Needs to do use of clear tape to keep the freestyle libre sensor on consistently  Discussed blood sugar targets fasting and after meals  He will wait until at least 8 AM to take his Lantus history of 4:30 in the morning before he leaves for work  Try taking 40 units in the morning and 50 at suppertime to help with overnight readings  Start taking 10 to 15 units of Humalog at suppertime based on his meal size and carbohydrates  Take Jardiance regularly  Avoid large amounts of carbohydrate or high fat foods Continue regular walking   Start FENOFIBRATE for his lipids Continue simvastatin  There are no Patient Instructions on file for this visit.   Elayne Snare 05/27/2022, 8:26 PM   Note: This office note was prepared with Dragon voice recognition system technology. Any transcriptional errors that result from this process are unintentional.

## 2022-05-28 ENCOUNTER — Ambulatory Visit: Payer: 59 | Admitting: Endocrinology

## 2022-05-28 DIAGNOSIS — E1165 Type 2 diabetes mellitus with hyperglycemia: Secondary | ICD-10-CM

## 2022-05-31 IMAGING — DX DG TOE GREAT 2+V*R*
1 series · 3 of 3 positions shown · non-contrast
Comparison: None.

CLINICAL DATA: First toe injury a week ago with blister.

EXAM:
RIGHT GREAT TOE

[Series 1: toe · 0.14mm/px · 3 of 3 slices shown]
[im 1/3]
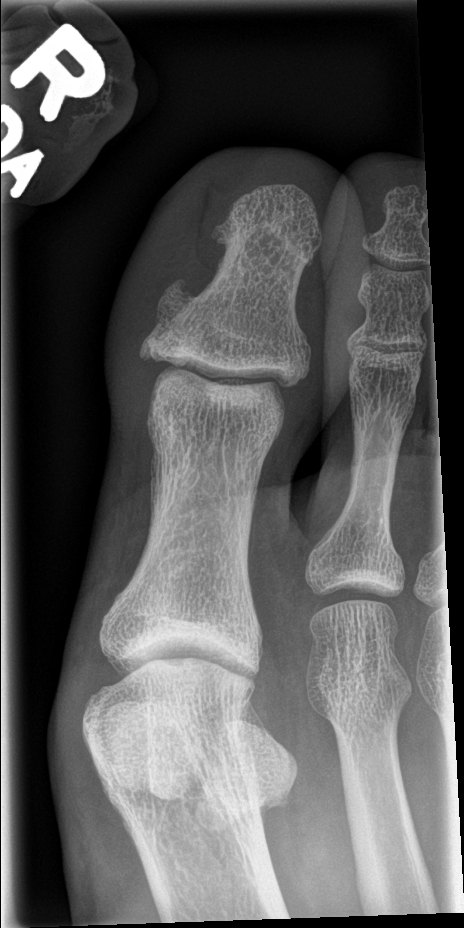
[im 2/3]
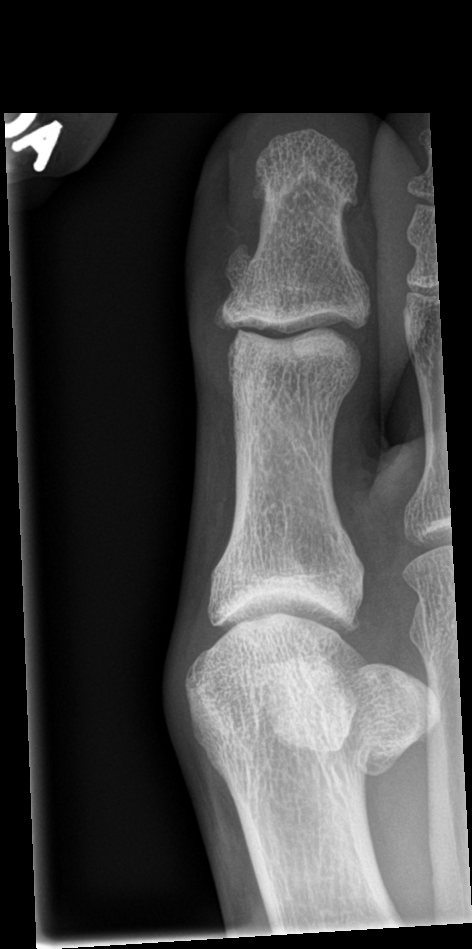
[im 3/3]
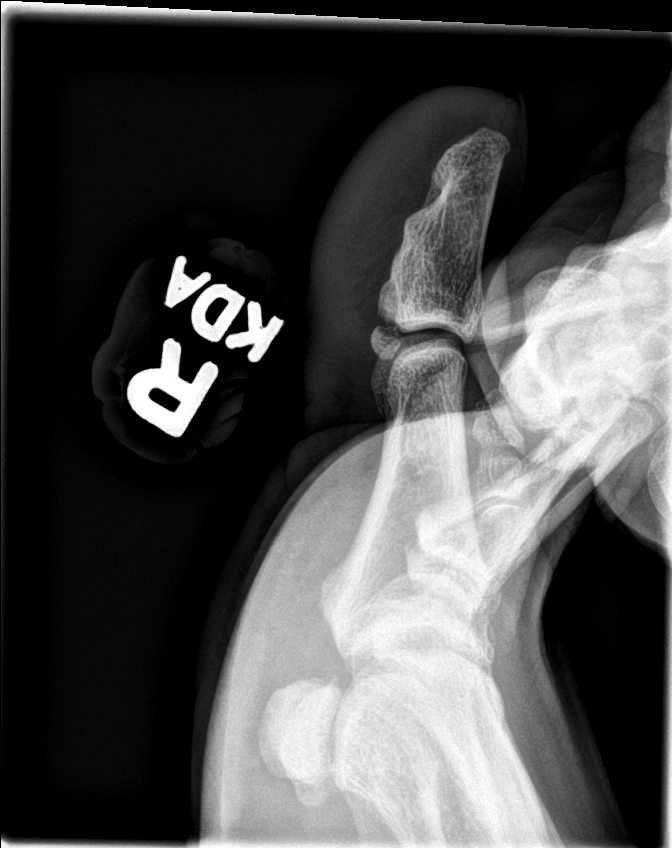

[3 of 3 positions shown; findings below may reference images not displayed]

FINDINGS: No evidence of osteomyelitis or foreign body. Interphalangeal
degenerative spurring. No acute fracture or dislocation.
IMPRESSION: No acute finding.

## 2022-06-02 ENCOUNTER — Other Ambulatory Visit (HOSPITAL_COMMUNITY): Payer: Self-pay

## 2022-06-02 ENCOUNTER — Other Ambulatory Visit: Payer: Self-pay | Admitting: Endocrinology

## 2022-06-02 MED ORDER — FREESTYLE LIBRE 3 SENSOR MISC
1.0000 | 2 refills | Status: DC
  Filled 2022-06-02: qty 2, 28d supply, fill #0
  Filled 2022-06-19 – 2022-06-23 (×2): qty 2, 28d supply, fill #1
  Filled 2022-09-28: qty 2, 28d supply, fill #2

## 2022-06-16 ENCOUNTER — Other Ambulatory Visit: Payer: Self-pay | Admitting: Endocrinology

## 2022-06-16 ENCOUNTER — Other Ambulatory Visit (HOSPITAL_COMMUNITY): Payer: Self-pay

## 2022-06-16 DIAGNOSIS — E1165 Type 2 diabetes mellitus with hyperglycemia: Secondary | ICD-10-CM

## 2022-06-17 ENCOUNTER — Other Ambulatory Visit (HOSPITAL_COMMUNITY): Payer: Self-pay

## 2022-06-17 MED ORDER — TRULICITY 4.5 MG/0.5ML ~~LOC~~ SOAJ
4.5000 mg | SUBCUTANEOUS | 2 refills | Status: DC
  Filled 2022-06-17 – 2022-08-24 (×2): qty 2, 28d supply, fill #0
  Filled 2022-09-28: qty 2, 28d supply, fill #1
  Filled 2022-11-06: qty 2, 28d supply, fill #2

## 2022-06-19 ENCOUNTER — Other Ambulatory Visit (HOSPITAL_COMMUNITY): Payer: Self-pay

## 2022-06-23 ENCOUNTER — Other Ambulatory Visit (HOSPITAL_COMMUNITY): Payer: Self-pay

## 2022-06-24 ENCOUNTER — Other Ambulatory Visit (HOSPITAL_COMMUNITY): Payer: Self-pay

## 2022-07-07 ENCOUNTER — Encounter (HOSPITAL_BASED_OUTPATIENT_CLINIC_OR_DEPARTMENT_OTHER): Payer: Self-pay | Admitting: Family Medicine

## 2022-07-07 ENCOUNTER — Ambulatory Visit (INDEPENDENT_AMBULATORY_CARE_PROVIDER_SITE_OTHER): Payer: 59 | Admitting: Family Medicine

## 2022-07-07 DIAGNOSIS — R197 Diarrhea, unspecified: Secondary | ICD-10-CM

## 2022-07-07 NOTE — Assessment & Plan Note (Signed)
Patient reports that about 8 days ago, he began to experience some stomach cramping and began to have nausea and vomiting.  This has since resolved, however he subsequently developed diarrhea and still has some issues with this currently.  He reports that at present, he has been noticing about 3-4 loose bowel movements per day.  Bowel movements are a bit softer/more watery previously and severe has been some slight improvement.  He has occasional mild abdominal discomfort, no severe abdominal pain reported.  Denies any fever.  Has had chills last night.  No blood noted in the stool.  His primary concern is related to work and if any specific precautions are needed or if he may continue to go to work.  He works in Systems analyst. Discussed general recommendations, given lack of red flags based on reported symptoms, feel that it would be appropriate for patient to continue with conservative measures, discussed importance of maintaining adequate hydration, can utilize over-the-counter Imodium to help with reducing bowel movements.  She does report that at baseline, he will have about 1-2 bowel movements per day. We discussed potential warning signs which would increase concern for notable dehydration for which he should present to emergency department for further evaluation, particularly if he is not able to tolerate oral liquids, is noticing lightheadedness, dizziness, increased heart rate.  Discussed expected prognosis and improvement in symptoms over the coming days/week.

## 2022-07-07 NOTE — Progress Notes (Signed)
   Virtual Visit via Telephone   I connected with  Roy Jennings  on 07/07/22 by telephone/telehealth and verified that I am speaking with the correct person using two identifiers.   I discussed the limitations, risks, security and privacy concerns of performing an evaluation and management service by telephone, including the higher likelihood of inaccurate diagnosis and treatment, and the availability of in person appointments.  We also discussed the likely need of an additional face to face encounter for complete and high quality delivery of care.  I also discussed with the patient that there may be a patient responsible charge related to this service. The patient expressed understanding and wishes to proceed.  Provider location is in medical facility. Patient location is at their home, different from provider location. People involved in care of the patient during this telehealth encounter were myself, my nurse/medical assistant, and my front office/scheduling team member.  Review of Systems: No fevers, chills, night sweats, weight loss, chest pain, or shortness of breath.   Objective Findings:    General: Speaking full sentences, no audible heavy breathing.  Sounds alert and appropriately interactive.    Independent interpretation of tests performed by another provider:   None.  Brief History, Exam, Impression, and Recommendations:    Diarrhea Patient reports that about 8 days ago, he began to experience some stomach cramping and began to have nausea and vomiting.  This has since resolved, however he subsequently developed diarrhea and still has some issues with this currently.  He reports that at present, he has been noticing about 3-4 loose bowel movements per day.  Bowel movements are a bit softer/more watery previously and severe has been some slight improvement.  He has occasional mild abdominal discomfort, no severe abdominal pain reported.  Denies any fever.  Has had chills last  night.  No blood noted in the stool.  His primary concern is related to work and if any specific precautions are needed or if he may continue to go to work.  He works in Systems analyst. Discussed general recommendations, given lack of red flags based on reported symptoms, feel that it would be appropriate for patient to continue with conservative measures, discussed importance of maintaining adequate hydration, can utilize over-the-counter Imodium to help with reducing bowel movements.  She does report that at baseline, he will have about 1-2 bowel movements per day. We discussed potential warning signs which would increase concern for notable dehydration for which he should present to emergency department for further evaluation, particularly if he is not able to tolerate oral liquids, is noticing lightheadedness, dizziness, increased heart rate.  Discussed expected prognosis and improvement in symptoms over the coming days/week.  I discussed the above assessment and treatment plan with the patient. The patient was provided an opportunity to ask questions and all were answered. The patient agreed with the plan and demonstrated an understanding of the instructions.  The patient was advised to call back or seek an in-person evaluation if the symptoms worsen or if the condition fails to improve as anticipated.  I provided 12 minutes of face to face and non-face-to-face time during this encounter date, time was needed to gather information, review chart, records, communicate/coordinate with staff remotely, as well as complete documentation.   ___________________________________________ Bevan Disney [place], MD, ABFM, CAQSM Primary Care and Sports Medicine [facility]

## 2022-08-13 LAB — HM DIABETES EYE EXAM

## 2022-08-15 DIAGNOSIS — E119 Type 2 diabetes mellitus without complications: Secondary | ICD-10-CM | POA: Diagnosis not present

## 2022-08-15 DIAGNOSIS — Z0100 Encounter for examination of eyes and vision without abnormal findings: Secondary | ICD-10-CM | POA: Diagnosis not present

## 2022-08-19 ENCOUNTER — Encounter: Payer: Self-pay | Admitting: Endocrinology

## 2022-08-20 ENCOUNTER — Ambulatory Visit (HOSPITAL_BASED_OUTPATIENT_CLINIC_OR_DEPARTMENT_OTHER): Payer: 59

## 2022-08-20 DIAGNOSIS — Z0000 Encounter for general adult medical examination without abnormal findings: Secondary | ICD-10-CM | POA: Diagnosis not present

## 2022-08-21 LAB — CBC WITH DIFFERENTIAL/PLATELET
Basophils Absolute: 0 10*3/uL (ref 0.0–0.2)
Basos: 0 %
EOS (ABSOLUTE): 0.1 10*3/uL (ref 0.0–0.4)
Eos: 1 %
Hematocrit: 43.6 % (ref 37.5–51.0)
Hemoglobin: 14.3 g/dL (ref 13.0–17.7)
Immature Grans (Abs): 0 10*3/uL (ref 0.0–0.1)
Immature Granulocytes: 0 %
Lymphocytes Absolute: 1.6 10*3/uL (ref 0.7–3.1)
Lymphs: 31 %
MCH: 26.8 pg (ref 26.6–33.0)
MCHC: 32.8 g/dL (ref 31.5–35.7)
MCV: 82 fL (ref 79–97)
Monocytes Absolute: 0.4 10*3/uL (ref 0.1–0.9)
Monocytes: 7 %
Neutrophils Absolute: 3.1 10*3/uL (ref 1.4–7.0)
Neutrophils: 61 %
Platelets: 388 10*3/uL (ref 150–450)
RBC: 5.34 x10E6/uL (ref 4.14–5.80)
RDW: 13.2 % (ref 11.6–15.4)
WBC: 5.1 10*3/uL (ref 3.4–10.8)

## 2022-08-21 LAB — TSH RFX ON ABNORMAL TO FREE T4: TSH: 0.459 u[IU]/mL (ref 0.450–4.500)

## 2022-08-24 ENCOUNTER — Other Ambulatory Visit: Payer: Self-pay | Admitting: Endocrinology

## 2022-08-24 ENCOUNTER — Other Ambulatory Visit (HOSPITAL_COMMUNITY): Payer: Self-pay

## 2022-08-24 DIAGNOSIS — E1165 Type 2 diabetes mellitus with hyperglycemia: Secondary | ICD-10-CM

## 2022-08-25 ENCOUNTER — Other Ambulatory Visit: Payer: Self-pay

## 2022-08-25 ENCOUNTER — Other Ambulatory Visit (HOSPITAL_COMMUNITY): Payer: Self-pay

## 2022-08-25 MED ORDER — INSULIN GLARGINE-YFGN 100 UNIT/ML ~~LOC~~ SOPN
44.0000 [IU] | PEN_INJECTOR | Freq: Two times a day (BID) | SUBCUTANEOUS | 2 refills | Status: DC
  Filled 2022-08-25: qty 30, 34d supply, fill #0
  Filled 2022-11-06: qty 30, 34d supply, fill #1
  Filled 2023-02-24: qty 30, 34d supply, fill #2

## 2022-08-26 ENCOUNTER — Other Ambulatory Visit (HOSPITAL_COMMUNITY): Payer: Self-pay

## 2022-08-27 ENCOUNTER — Ambulatory Visit (INDEPENDENT_AMBULATORY_CARE_PROVIDER_SITE_OTHER): Payer: 59 | Admitting: Family Medicine

## 2022-08-27 ENCOUNTER — Encounter (HOSPITAL_BASED_OUTPATIENT_CLINIC_OR_DEPARTMENT_OTHER): Payer: Self-pay | Admitting: Family Medicine

## 2022-08-27 VITALS — BP 131/85 | HR 87 | Resp 18 | Ht 72.0 in | Wt 264.0 lb

## 2022-08-27 DIAGNOSIS — Z0000 Encounter for general adult medical examination without abnormal findings: Secondary | ICD-10-CM | POA: Diagnosis not present

## 2022-08-27 NOTE — Progress Notes (Signed)
Complete physical exam  Patient: Roy Jennings   DOB: Sep 16, 1980   42 y.o. Male  MRN: [id number]  Subjective:    Chief Complaint  Patient presents with   Annual Exam    Roy Jennings is a 42 y.o. male who presents today for a complete physical exam. He reports consuming a  low carb/diabetic  diet.  Walks for exercise.   He generally feels well. He reports sleeping well. He does not have additional problems to discuss today.   Followed by endocrine for DM.   Most recent fall risk assessment:    08/27/2022    2:22 PM  [place] in the past year? 0  Number falls in past yr: 0  Injury with Fall? 0     Most recent depression screenings:    08/27/2022    2:21 PM 07/07/2022    1:55 PM  PHQ 2/9 Scores  PHQ - 2 Score 0 0  PHQ- 9 Score 0 0  Exception Documentation  Medical reason    Vision:last eye exam  last week on 08/16/22     Patient Care Team: [place], Blondell Reveal, MD as PCP - General (Family Medicine)   Outpatient Medications Prior to Visit  Medication Sig   Blood Glucose Monitoring Suppl (FREESTYLE FREEDOM LITE) w/Device KIT Use as instructed to check blood sugar twice daily.   Continuous Blood Gluc Sensor (FREESTYLE LIBRE 3 SENSOR) MISC Apply 1 sensor on upper arm every 14 days for continuous glucose monitoring   Dulaglutide (TRULICITY) 4.5 OH/6.0VP SOPN Inject 4.5 mg into the skin once a week.   empagliflozin (JARDIANCE) 25 MG TABS tablet Take 1 tablet (25 mg total) by mouth daily.   fenofibrate (TRICOR) 145 MG tablet Take 1 tablet (145 mg total) by mouth daily.   fexofenadine (ALLEGRA) 180 MG tablet Take 1 tablet (180 mg total) by mouth daily.   glucose blood (FREESTYLE LITE) test strip Use as instructed to check blood sugar twice daily.   insulin glargine-yfgn (SEMGLEE, YFGN,) 100 UNIT/ML Pen Inject 44 Units into the skin 2 (two) times daily.   insulin lispro (HUMALOG KWIKPEN) 100 UNIT/ML KwikPen Inject 10 Units into the skin 3 (three) times daily before  meals (Patient taking differently: Inject 10 Units into the skin 3 (three) times daily. 10 units before dinner)   Insulin Pen Needle (UNIFINE PENTIPS) 31G X 8 MM MISC USE TO INJECT INSULIN 1-3 TIMES INTO SKIN DAILY   Insulin Pen Needle 31G X 6 MM MISC Use as directed with insulin.   Lancets (FREESTYLE) lancets 1 each by Other route 2 (two) times daily. for testing   simvastatin (ZOCOR) 20 MG tablet Take 1 tablet (20 mg total) by mouth at bedtime.   triamcinolone (NASACORT) 55 MCG/ACT AERO nasal inhaler Place 2 sprays into the nose daily.   No facility-administered medications prior to visit.    Review of Systems  Constitutional:  Negative for chills and fever.  Respiratory:  Negative for shortness of breath.   Cardiovascular:  Negative for chest pain and leg swelling.  Gastrointestinal:  Negative for abdominal pain, nausea and vomiting.  Neurological:  Negative for dizziness, weakness and headaches.  Psychiatric/Behavioral:  Negative for depression and suicidal ideas.           Objective:     BP 131/85 (BP Location: Left Arm, Patient Position: Sitting, Cuff Size: Large)   Pulse 87   Resp 18   Ht 6' (1.829 m)   [facility]  264 lb (119.7 kg)   SpO2 96%   BMI 35.80 kg/m  BP Readings from Last 3 Encounters:  08/27/22 131/85  04/27/22 (!) 128/92  03/05/22 122/74      Physical Exam Vitals and nursing note reviewed.  Constitutional:      General: He is not in acute distress.    Appearance: Normal appearance. He is obese.  HENT:     Right Ear: Tympanic membrane normal.     Left Ear: Tympanic membrane normal.     Nose: Nose normal.     Mouth/Throat:     Mouth: Mucous membranes are moist.     Pharynx: Oropharynx is clear.  Cardiovascular:     Rate and Rhythm: Normal rate and regular rhythm.     Pulses: Normal pulses.     Heart sounds: Normal heart sounds.  Pulmonary:     Effort: Pulmonary effort is normal.     Breath sounds: Normal breath sounds.  Abdominal:     General:  Bowel sounds are normal.     Palpations: Abdomen is soft.     Tenderness: There is no abdominal tenderness.  Musculoskeletal:        General: Normal range of motion.  Skin:    General: Skin is warm and dry.     Capillary Refill: Capillary refill takes less than 2 seconds.  Neurological:     General: No focal deficit present.     Mental Status: He is alert. Mental status is at baseline.  Psychiatric:        Mood and Affect: Mood normal.        Behavior: Behavior normal.        Thought Content: Thought content normal.        Judgment: Judgment normal.      No results found for any visits on 08/27/22.     Assessment & Plan:    Routine Health Maintenance and Physical Exam  Immunization History  Administered Date(s) Administered   Influenza,inj,Quad PF,6+ Mos 07/28/2018   PFIZER(Purple Top)SARS-COV-2 Vaccination 12/01/2019, 12/23/2019, 07/20/2020   PPD Test 06/09/2022   Pneumococcal Polysaccharide-23 01/09/2013   Tdap 01/09/2013    Health Maintenance  Topic Date Due   Hepatitis C Screening  Never done   HEMOGLOBIN A1C  08/25/2022   COVID-19 Vaccine (4 - 2023-24 season) 09/12/2022 (Originally 04/03/2022)   INFLUENZA VACCINE  11/01/2022 (Originally 03/03/2022)   HIV Screening  11/02/2079 (Originally 12/30/1994)   DTaP/Tdap/Td (2 - Td or Tdap) 01/10/2023   Diabetic kidney evaluation - Urine ACR  03/04/2023   FOOT EXAM  03/06/2023   Diabetic kidney evaluation - eGFR measurement  05/26/2023   LIPID PANEL  05/26/2023   OPHTHALMOLOGY EXAM  08/14/2023   HPV VACCINES  Aged Out    Discussed health benefits of physical activity, and encouraged him to engage in regular exercise appropriate for his age and condition.  Problem List Items Addressed This Visit     Annual physical exam - Primary  Routine labs reviewed. HCM reviewed/discussed. Anticipatory guidance regarding healthy weight, lifestyle and choices given. Recommend healthy diet.  Recommend approximately 150 minutes/week of  moderate intensity exercise. Limit alcohol consumption: no more than one drink per day for women and 2 drinks per day for me. Recommend regular dental and vision exams. Always use seatbelt/lap and shoulder restraints. Recommend using smoke alarms and checking batteries at least twice a year. Recommend using sunscreen when outside. Recommended seasonal influenza vaccine: declines this today.    Return in about 1  year (around 08/28/2023) for for CPE .  Has appointment with endocrine on 09/15/22 with labs before that visit.    Chalmers Guest, FNP

## 2022-09-08 ENCOUNTER — Other Ambulatory Visit: Payer: Self-pay | Admitting: Endocrinology

## 2022-09-08 DIAGNOSIS — E1165 Type 2 diabetes mellitus with hyperglycemia: Secondary | ICD-10-CM

## 2022-09-08 DIAGNOSIS — E782 Mixed hyperlipidemia: Secondary | ICD-10-CM

## 2022-09-11 ENCOUNTER — Other Ambulatory Visit (INDEPENDENT_AMBULATORY_CARE_PROVIDER_SITE_OTHER): Payer: 59

## 2022-09-11 DIAGNOSIS — E782 Mixed hyperlipidemia: Secondary | ICD-10-CM

## 2022-09-11 DIAGNOSIS — E1165 Type 2 diabetes mellitus with hyperglycemia: Secondary | ICD-10-CM

## 2022-09-11 DIAGNOSIS — Z794 Long term (current) use of insulin: Secondary | ICD-10-CM

## 2022-09-11 LAB — LIPID PANEL
Cholesterol: 210 mg/dL — ABNORMAL HIGH (ref 0–200)
HDL: 31.9 mg/dL — ABNORMAL LOW (ref >39.00)
LDL Cholesterol: 161 mg/dL — ABNORMAL HIGH (ref 0–99)
NonHDL: 178.16
Total CHOL/HDL Ratio: 7
Triglycerides: 84 mg/dL (ref 0.0–149.0)
VLDL: 16.8 mg/dL (ref 0.0–40.0)

## 2022-09-11 LAB — BASIC METABOLIC PANEL
BUN: 9 mg/dL (ref 6–23)
CO2: 29 mEq/L (ref 19–32)
Calcium: 9.6 mg/dL (ref 8.4–10.5)
Chloride: 104 mEq/L (ref 96–112)
Creatinine, Ser: 1.09 mg/dL (ref 0.40–1.50)
GFR: 83.6 mL/min (ref >60.00)
Glucose, Bld: 103 mg/dL — ABNORMAL HIGH (ref 70–99)
Potassium: 3.9 mEq/L (ref 3.5–5.1)
Sodium: 142 mEq/L (ref 135–145)

## 2022-09-11 LAB — HEMOGLOBIN A1C: Hgb A1c MFr Bld: 6.6 % — ABNORMAL HIGH (ref 4.6–6.5)

## 2022-09-15 ENCOUNTER — Other Ambulatory Visit (HOSPITAL_COMMUNITY): Payer: Self-pay

## 2022-09-15 ENCOUNTER — Telehealth (INDEPENDENT_AMBULATORY_CARE_PROVIDER_SITE_OTHER): Payer: 59 | Admitting: Endocrinology

## 2022-09-15 ENCOUNTER — Encounter: Payer: Self-pay | Admitting: Endocrinology

## 2022-09-15 VITALS — Ht 72.0 in

## 2022-09-15 DIAGNOSIS — E1169 Type 2 diabetes mellitus with other specified complication: Secondary | ICD-10-CM | POA: Diagnosis not present

## 2022-09-15 DIAGNOSIS — E669 Obesity, unspecified: Secondary | ICD-10-CM

## 2022-09-15 DIAGNOSIS — E782 Mixed hyperlipidemia: Secondary | ICD-10-CM

## 2022-09-15 MED ORDER — SIMVASTATIN 40 MG PO TABS
20.0000 mg | ORAL_TABLET | Freq: Every day | ORAL | 1 refills | Status: DC
  Filled 2022-09-15: qty 45, 90d supply, fill #0
  Filled 2023-03-31: qty 45, 90d supply, fill #1

## 2022-09-15 MED ORDER — EMPAGLIFLOZIN 25 MG PO TABS
25.0000 mg | ORAL_TABLET | Freq: Every day | ORAL | 2 refills | Status: DC
  Filled 2022-09-15: qty 90, 90d supply, fill #0

## 2022-09-15 NOTE — Progress Notes (Signed)
Patient ID: Roy Jennings, male   DOB: [birth year], 42 y.o.   MRN: [id number]            Reason for Appointment: Follow-up for Type 2 Diabetes    History of Present Illness:          Date of diagnosis of type 2 diabetes mellitus: 12/2011       Background history:   He was initially diagnosed with marked hyperglycemia and A1c of 13.4% He was apparently started on insulin at onset and has taken various insulin regimens in the past He has had difficulty tolerating metformin and has not taken this most of the time He has probably been on Lantus since about 08/2015, previously on the 70/30 insulin His level of control had improved initially in 2014 and 2015  Recent history:   INSULIN regimen is: 44 units generic glargine bid, not taking Humalog before dinner  Non-insulin hypoglycemic drugs the patient is taking are:  Jardiance 25 mg in am, Trulicity 4.5 weekly   Current management, blood sugar patterns and problems identified:  His A1c is 6.6 and significantly better His last visit was 6 months ago  He has lost almost 20 pounds since his last visit This is from doing a 15-hour intermittent fast and only eating dinner Previously he thinks he was eating all the time with snacks also Appears to be using his freestyle libre sensor fairly consistently now Currently with his better diet his blood sugars do not go up after dinner much He has continued the same dose of glargine insulin twice a day No hypoglycemia seen recently and he does not think he has any low sugar symptoms He does try to walk up to 3 miles regularly   Side effects from medications have been: Diarrhea from most doses of metformin  Freestyle libre was downloaded and analysis of the last 2 weeks data is as follows  Overnight blood sugars were very consistently near normal at all times Lowest blood sugars are before 2 PM and highest blood sugars on an average are about 7 AM Overnight blood sugars are averaging about  115 with some light rise early morning Blood sugars tend to decline until about 1 PM in the daytime and then mildly increased between about 120-130 the rest of the day No hypoglycemia No significant rise in blood sugars after meals 7 will have slight increase after all of the meals without consistency  AVERAGE 119 with time in range = 100% GMI 6.2  Previously:  CGM use % of time 70  2-week average/GV 133 19  Time in range      96%  % Time Above 180 4  % Time above 250   % Time Below 70      PRE-MEAL Fasting Lunch Dinner Bedtime Overall  Glucose range:       Averages: 140 113 133  133   POST-MEAL PC Breakfast PC Lunch PC Dinner  Glucose range:     Averages:  130 146     Self-care: The diet that the patient has been following is: None   Typical meal intake: Variable        Dietician visit, most recent: 08/2015, diabetes educator in 08/2018                Weight history:   Wt Readings from Last 3 Encounters:  08/27/22 264 lb (119.7 kg)  04/27/22 271 lb 3.2 oz (123 kg)  03/05/22 283 lb 9.6 oz (128.6 kg)  Glycemic control:   Lab Results  Component Value Date   HGBA1C 6.6 (H) 09/11/2022   HGBA1C 6.7 Repeated and verified X2. (H) 05/25/2022   HGBA1C 8.1 (H) 03/03/2022   Lab Results  Component Value Date   MICROALBUR 2.6 (H) 03/03/2022   LDLCALC 161 (H) 09/11/2022   CREATININE 1.09 09/11/2022   Lab Results  Component Value Date   MICRALBCREAT 1.2 03/03/2022    Lab Results  Component Value Date   FRUCTOSAMINE 266 11/26/2021   FRUCTOSAMINE 283 08/31/2018   FRUCTOSAMINE 292 (H) 03/23/2018   Other active problems: See review of systems   Allergies as of 09/15/2022       Reactions   Metformin And Related    Diarrhea, nausea        Medication List        Accurate as of September 15, 2022  8:29 AM. If you have any questions, ask your nurse or doctor.          fenofibrate 145 MG tablet Commonly known as: Tricor Take 1 tablet (145 mg total) by  mouth daily.   fexofenadine 180 MG tablet Commonly known as: ALLEGRA Take 1 tablet (180 mg total) by mouth daily.   FreeStyle Freedom Lite w/Device Kit Use as instructed to check blood sugar twice daily.   freestyle lancets 1 each by Other route 2 (two) times daily. for testing   FreeStyle Libre 3 Sensor Misc Apply 1 sensor on upper arm every 14 days for continuous glucose monitoring   glucose blood test strip Commonly known as: FREESTYLE LITE Use as instructed to check blood sugar twice daily.   HumaLOG KwikPen 100 UNIT/ML KwikPen Generic drug: insulin lispro Inject 10 Units into the skin 3 (three) times daily before meals What changed: additional instructions   Insulin Pen Needle 31G X 8 MM Misc Commonly known as: Unifine Pentips USE TO INJECT INSULIN 1-3 TIMES INTO SKIN DAILY   Unifine Pentips 31G X 6 MM Misc Generic drug: Insulin Pen Needle Use as directed with insulin.   Jardiance 25 MG Tabs tablet Generic drug: empagliflozin Take 1 tablet (25 mg total) by mouth daily.   Semglee (yfgn) 100 UNIT/ML Pen Generic drug: insulin glargine-yfgn Inject 44 Units into the skin 2 (two) times daily.   simvastatin 20 MG tablet Commonly known as: ZOCOR Take 1 tablet (20 mg total) by mouth at bedtime.   triamcinolone 55 MCG/ACT Aero nasal inhaler Commonly known as: NASACORT Place 2 sprays into the nose daily.   Trulicity 4.5 0000000 Sopn Generic drug: Dulaglutide Inject 4.5 mg into the skin once a week.        Allergies:  Allergies  Allergen Reactions   Metformin And Related     Diarrhea, nausea    Past Medical History:  Diagnosis Date   Diabetes mellitus 12/30/11   DKA (diabetic ketoacidoses) 12/30/11   new dx DM a1c 13   PVC (premature ventricular contraction)    LBB inferior axis QRS // followed by [name]   Right bundle branch block     Past Surgical History:  Procedure Laterality Date   KNEE ARTHROSCOPY  1999-2000   bilateral     Family History  Problem Relation Age of Onset   Diabetes Father    Diabetes Maternal Grandmother    Hypertension Mother    CAD Neg Hx     Social History:  reports that he has never smoked. He has never been exposed to tobacco smoke. He has never used smokeless  tobacco. He reports current alcohol use. He reports that he does not use drugs.   Review of Systems   Hyperlipidemia:  He is on simvastatin as a statin drug, LDL is under 100 His triglycerides are consistently over 200   Lab Results  Component Value Date   CHOL 210 (H) 09/11/2022   CHOL 188 05/25/2022   CHOL 191 03/03/2022   Lab Results  Component Value Date   HDL 31.90 (L) 09/11/2022   HDL 36.90 (L) 05/25/2022   HDL 35.00 (L) 03/03/2022   Lab Results  Component Value Date   LDLCALC 161 (H) 09/11/2022   LDLCALC 115 (H) 05/25/2022   LDLCALC 115 (H) 12/17/2021   Lab Results  Component Value Date   TRIG 84.0 09/11/2022   TRIG 178.0 (H) 05/25/2022   TRIG 277.0 (H) 03/03/2022   Lab Results  Component Value Date   CHOLHDL 7 09/11/2022   CHOLHDL 5 05/25/2022   CHOLHDL 5 03/03/2022   Lab Results  Component Value Date   LDLDIRECT 82.0 03/03/2022   LDLDIRECT 94.0 09/19/2021   LDLDIRECT 101.0 04/16/2021            Blood pressure: He has been not recommended any treatment for hypertension   BP Readings from Last 3 Encounters:  08/27/22 131/85  04/27/22 (!) 128/92  03/05/22 122/74    Most recent eye exam was 2022 but report not available  Most recent foot exam: 03/2022    LABS:  Lab on 09/11/2022  Component Date Value Ref Range Status   Cholesterol 09/11/2022 210 (H)  0 - 200 mg/dL Final   ATP III Classification       Desirable:  < 200 mg/dL               Borderline High:  200 - 239 mg/dL          High:  > = 240 mg/dL   Triglycerides 09/11/2022 84.0  0.0 - 149.0 mg/dL Final   Normal:  <150 mg/dLBorderline High:  150 - 199 mg/dL   HDL 09/11/2022 31.90 (L)  >39.00 mg/dL Final   VLDL  09/11/2022 16.8  0.0 - 40.0 mg/dL Final   LDL Cholesterol 09/11/2022 161 (H)  0 - 99 mg/dL Final   Total CHOL/HDL Ratio 09/11/2022 7   Final                  Men          Women1/2 Average Risk     3.4          3.3Average Risk          5.0          4.42X Average Risk          9.6          7.13X Average Risk          15.0          11.0                       NonHDL 09/11/2022 178.16   Final   NOTE:  Non-HDL goal should be 30 mg/dL higher than patient's LDL goal (i.e. LDL goal of < 70 mg/dL, would have non-HDL goal of < 100 mg/dL)   Sodium 09/11/2022 142  135 - 145 mEq/L Final   Potassium 09/11/2022 3.9  3.5 - 5.1 mEq/L Final   Chloride 09/11/2022 104  96 - 112 mEq/L Final   CO2 09/11/2022 29  19 - 32 mEq/L Final   Glucose, Bld 09/11/2022 103 (H)  70 - 99 mg/dL Final   BUN 09/11/2022 9  6 - 23 mg/dL Final   Creatinine, Ser 09/11/2022 1.09  0.40 - 1.50 mg/dL Final   GFR 09/11/2022 83.60  >60.00 mL/min Final   Calculated using the CKD-EPI Creatinine Equation (2021)   Calcium 09/11/2022 9.6  8.4 - 10.5 mg/dL Final   Hgb A1c MFr Bld 09/11/2022 6.6 (H)  4.6 - 6.5 % Final   Glycemic Control Guidelines for People with Diabetes:Non Diabetic:  <6%Goal of Therapy: <7%Additional Action Suggested:  >8%     Physical Examination:  Ht 6' (1.829 m)   BMI 35.80 kg/m    ASSESSMENT:  Diabetes type 2, with obesity  See history of present illness for detailed discussion of current diabetes management, blood sugar patterns and problems identified  He is on a regimen of basal insulin 88 units, Jardiance 25 mg and Trulicity 4.5 mg  His [id number] is 6.6 compared to 8.1  Blood sugars are dramatically better compared to last year when he was still having continued pain hypoglycemia Most of his improvement is related to significant reducing caloric intake overall and trying to do intermittent fasting He has lost nearly 20 pounds Although he has not needed less basal insulin blood sugars may tend to be low normal  in the early afternoon Although he has reportedly taken his Jardiance not clear if he has had a recent prescription from his refill history  Hyperlipidemia: Triglycerides much improved with weight loss but not taking simvastatin for LDL currently   PLAN:   Continue using libre sensor Reduce morning insulin to 40 units May need to continue reducing the dose if blood sugars in the afternoons are low normal New prescription for Jardiance to be given also Continue Trulicity unchanged Keep calorie intake restricted as before Continue regular walking   RESTART simvastatin Will go to 40 mg since his LDL is relatively high and leave off fenofibrate as triglycerides are back to normal  Follow-up in 4 months There are no Patient Instructions on file for this visit.   Elayne Snare 09/15/2022, 8:29 AM   Note: This office note was prepared with Dragon voice recognition system technology. Any transcriptional errors that result from this process are unintentional.

## 2022-09-16 ENCOUNTER — Encounter: Payer: Self-pay | Admitting: Endocrinology

## 2022-09-17 ENCOUNTER — Other Ambulatory Visit (HOSPITAL_COMMUNITY): Payer: Self-pay

## 2022-09-28 ENCOUNTER — Other Ambulatory Visit (HOSPITAL_COMMUNITY): Payer: Self-pay

## 2022-11-06 ENCOUNTER — Other Ambulatory Visit (HOSPITAL_COMMUNITY): Payer: Self-pay

## 2022-12-15 ENCOUNTER — Other Ambulatory Visit (HOSPITAL_COMMUNITY): Payer: Self-pay

## 2022-12-15 ENCOUNTER — Other Ambulatory Visit: Payer: Self-pay | Admitting: Endocrinology

## 2022-12-15 ENCOUNTER — Other Ambulatory Visit: Payer: Self-pay

## 2022-12-15 DIAGNOSIS — E1165 Type 2 diabetes mellitus with hyperglycemia: Secondary | ICD-10-CM

## 2022-12-15 MED ORDER — TRULICITY 4.5 MG/0.5ML ~~LOC~~ SOAJ
4.5000 mg | SUBCUTANEOUS | 2 refills | Status: DC
  Filled 2022-12-15: qty 2, 28d supply, fill #0
  Filled 2023-02-24: qty 2, 28d supply, fill #1
  Filled 2023-03-31: qty 2, 28d supply, fill #2

## 2022-12-15 MED ORDER — FREESTYLE LIBRE 3 SENSOR MISC
1.0000 | 2 refills | Status: DC
  Filled 2022-12-15: qty 2, 28d supply, fill #0
  Filled 2023-04-01: qty 2, 28d supply, fill #1

## 2022-12-21 ENCOUNTER — Other Ambulatory Visit (HOSPITAL_COMMUNITY): Payer: Self-pay

## 2022-12-24 ENCOUNTER — Other Ambulatory Visit (HOSPITAL_COMMUNITY): Payer: Self-pay

## 2022-12-29 ENCOUNTER — Other Ambulatory Visit (HOSPITAL_COMMUNITY): Payer: Self-pay

## 2023-01-13 ENCOUNTER — Other Ambulatory Visit (HOSPITAL_COMMUNITY): Payer: Self-pay

## 2023-02-24 ENCOUNTER — Other Ambulatory Visit (HOSPITAL_COMMUNITY): Payer: Self-pay

## 2023-03-11 ENCOUNTER — Encounter (HOSPITAL_BASED_OUTPATIENT_CLINIC_OR_DEPARTMENT_OTHER): Payer: Self-pay | Admitting: Family Medicine

## 2023-03-30 ENCOUNTER — Other Ambulatory Visit (HOSPITAL_COMMUNITY): Payer: Self-pay

## 2023-03-31 ENCOUNTER — Other Ambulatory Visit (HOSPITAL_COMMUNITY): Payer: Self-pay

## 2023-04-01 ENCOUNTER — Other Ambulatory Visit (HOSPITAL_COMMUNITY): Payer: Self-pay

## 2023-05-18 ENCOUNTER — Ambulatory Visit (HOSPITAL_BASED_OUTPATIENT_CLINIC_OR_DEPARTMENT_OTHER): Payer: 59

## 2023-05-18 ENCOUNTER — Encounter (HOSPITAL_BASED_OUTPATIENT_CLINIC_OR_DEPARTMENT_OTHER): Payer: Self-pay | Admitting: Family Medicine

## 2023-05-18 VITALS — BP 127/84 | HR 95 | Ht 72.0 in | Wt 241.0 lb

## 2023-05-18 DIAGNOSIS — H938X2 Other specified disorders of left ear: Secondary | ICD-10-CM

## 2023-05-18 NOTE — Progress Notes (Signed)
Patient presented in office today for same day visit for left ear fullness. He tried flushing ear at home. Provider was behind, and gave the approval to flush his ears and switch patient to nurse visit due to him stating he could not wait and had to be somewhere at 5pm. I administered debrox and let sit for 10 minutes due to patient having hard wax in ear, flushed patients left ear afterwards, he stated he felt a little bit better. Waited a few more minutes to see if provider would become available, patient requested I administer more Debrox and he would also purchase some OTC. Let patient know if he tries debrox for a few days, and still has no relief to schedule a visit to come back and see [place]. Apologized for the inconvenience patient very understanding and appreciative.

## 2023-05-18 NOTE — Progress Notes (Deleted)
Patient presented in office today for same day visit for left ear fullness. He tried flushing ear at home. Provider was behind, and gave the approval to flush his ears and switch patient to nurse visit due to him stating he could not wait and had to be somewhere at 5pm. I administered debrox and let sit for 10 minutes due to patient having hard wax in ear, flushed patients left ear afterwards, he stated he felt a little bit better. Waited a few more minutes to see if provider would become available, patient requested I administer more Debrox and he would also purchase some OTC. Let patient know if he tries debrox for a few days, and still has no relief to schedule a visit to come back and see [place]. Apologized for the inconvenience patient very understanding and appreciative.

## 2023-06-08 ENCOUNTER — Other Ambulatory Visit: Payer: Self-pay | Admitting: Endocrinology

## 2023-06-08 ENCOUNTER — Telehealth: Payer: Self-pay | Admitting: Endocrinology

## 2023-06-08 ENCOUNTER — Other Ambulatory Visit (HOSPITAL_COMMUNITY): Payer: Self-pay

## 2023-06-08 DIAGNOSIS — E1165 Type 2 diabetes mellitus with hyperglycemia: Secondary | ICD-10-CM

## 2023-06-08 MED ORDER — TRULICITY 4.5 MG/0.5ML ~~LOC~~ SOAJ
4.5000 mg | SUBCUTANEOUS | 2 refills | Status: DC
  Filled 2023-06-08: qty 2, 28d supply, fill #0

## 2023-06-08 NOTE — Telephone Encounter (Signed)
Patient asking for a refill on his Trulicity, please call patient when done. [facility] patient [facility].

## 2023-06-24 ENCOUNTER — Encounter (HOSPITAL_BASED_OUTPATIENT_CLINIC_OR_DEPARTMENT_OTHER): Payer: Self-pay | Admitting: Family Medicine

## 2023-06-29 ENCOUNTER — Other Ambulatory Visit: Payer: Self-pay

## 2023-06-29 DIAGNOSIS — E1169 Type 2 diabetes mellitus with other specified complication: Secondary | ICD-10-CM

## 2023-06-30 ENCOUNTER — Other Ambulatory Visit: Payer: 59

## 2023-06-30 DIAGNOSIS — E669 Obesity, unspecified: Secondary | ICD-10-CM | POA: Diagnosis not present

## 2023-06-30 DIAGNOSIS — E1169 Type 2 diabetes mellitus with other specified complication: Secondary | ICD-10-CM | POA: Diagnosis not present

## 2023-07-01 LAB — HEMOGLOBIN A1C
Hgb A1c MFr Bld: 6.4 %{Hb} — ABNORMAL HIGH (ref <5.7)
Mean Plasma Glucose: 137 mg/dL
eAG (mmol/L): 7.6 mmol/L

## 2023-07-01 LAB — BASIC METABOLIC PANEL
BUN: 9 mg/dL (ref 7–25)
CO2: 29 mmol/L (ref 20–32)
Calcium: 9.9 mg/dL (ref 8.6–10.3)
Chloride: 104 mmol/L (ref 98–110)
Creat: 1.14 mg/dL (ref 0.60–1.29)
Glucose, Bld: 115 mg/dL — ABNORMAL HIGH (ref 65–99)
Potassium: 4.4 mmol/L (ref 3.5–5.3)
Sodium: 142 mmol/L (ref 135–146)

## 2023-07-01 LAB — MICROALBUMIN / CREATININE URINE RATIO
Creatinine, Urine: 294 mg/dL (ref 20–320)
Microalb Creat Ratio: 3 mg/g{creat} (ref <30)
Microalb, Ur: 0.9 mg/dL

## 2023-07-06 ENCOUNTER — Other Ambulatory Visit: Payer: Self-pay

## 2023-07-06 ENCOUNTER — Other Ambulatory Visit (HOSPITAL_COMMUNITY): Payer: Self-pay

## 2023-07-06 ENCOUNTER — Ambulatory Visit (INDEPENDENT_AMBULATORY_CARE_PROVIDER_SITE_OTHER): Payer: 59 | Admitting: Endocrinology

## 2023-07-06 ENCOUNTER — Encounter: Payer: Self-pay | Admitting: Endocrinology

## 2023-07-06 VITALS — BP 112/80 | HR 91 | Resp 20 | Ht 72.0 in | Wt 245.2 lb

## 2023-07-06 DIAGNOSIS — E782 Mixed hyperlipidemia: Secondary | ICD-10-CM

## 2023-07-06 DIAGNOSIS — E119 Type 2 diabetes mellitus without complications: Secondary | ICD-10-CM

## 2023-07-06 DIAGNOSIS — Z794 Long term (current) use of insulin: Secondary | ICD-10-CM

## 2023-07-06 DIAGNOSIS — E1165 Type 2 diabetes mellitus with hyperglycemia: Secondary | ICD-10-CM

## 2023-07-06 MED ORDER — INSULIN GLARGINE-YFGN 100 UNIT/ML ~~LOC~~ SOPN
28.0000 [IU] | PEN_INJECTOR | Freq: Two times a day (BID) | SUBCUTANEOUS | 4 refills | Status: DC
  Filled 2023-07-06: qty 15, 27d supply, fill #0

## 2023-07-06 MED ORDER — INSULIN LISPRO (1 UNIT DIAL) 100 UNIT/ML (KWIKPEN)
PEN_INJECTOR | SUBCUTANEOUS | 1 refills | Status: AC
  Filled 2023-07-06: qty 15, 42d supply, fill #0

## 2023-07-06 MED ORDER — TRULICITY 4.5 MG/0.5ML ~~LOC~~ SOAJ
4.5000 mg | SUBCUTANEOUS | 2 refills | Status: DC
  Filled 2023-07-06: qty 6, 84d supply, fill #0
  Filled 2023-10-18: qty 6, 84d supply, fill #1
  Filled 2024-02-18: qty 2, 28d supply, fill #2

## 2023-07-06 MED ORDER — EMPAGLIFLOZIN 25 MG PO TABS
25.0000 mg | ORAL_TABLET | Freq: Every day | ORAL | 3 refills | Status: DC
  Filled 2023-07-06: qty 90, 90d supply, fill #0

## 2023-07-06 MED ORDER — FREESTYLE LIBRE 3 PLUS SENSOR MISC
1.0000 | 3 refills | Status: DC
  Filled 2023-07-06: qty 6, 90d supply, fill #0

## 2023-07-06 NOTE — Progress Notes (Unsigned)
Outpatient Endocrinology Note [name], MD  07/07/23  Patient's Name: Roy Jennings    DOB: 27-May-1981    MRN: [id number]                                                    REASON OF VISIT: Follow up for type 2 diabetes mellitus  PCP: de [place], Buren Kos, MD  HISTORY OF PRESENT ILLNESS:   Roy Jennings is a 42 y.o. old male with past medical history listed below, is here for follow up for type 2 diabetes mellitus.   Pertinent Diabetes History: Patient was previously seen by Dr. Lucianne Muss and was last seen in February 2024. Patient was diagnosed with type 2 diabetes mellitus in May 2013.  Initial hemoglobin A1c at the time of diagnosis was 13.4%, he was started on insulin therapy at the time of diagnosis.  Chronic Diabetes Complications : Retinopathy: no. Last ophthalmology exam was done on annually, 08/2022, following with ophthalmology regularly.  Nephropathy: no Peripheral neuropathy: no Coronary artery disease: no Stroke: no  Relevant comorbidities and cardiovascular risk factors: Obesity: yes Body mass index is 33.26 kg/m.  Hypertension: no Hyperlipidemia : Yes, on statin   Current / Home Diabetic regimen includes:  Semeglee 30 units daily in the morning.  Humalog not taking lately. Trulicity 4.5mg  weekly. Jardiance 25 mg daily.   Prior diabetic medications: GI issues /diarrhea with metformin.  Lantus, insulin 70/30 in the past.  Glycemic data:    CONTINUOUS GLUCOSE MONITORING SYSTEM (CGMS) INTERPRETATION: At today's visit, we reviewed CGM downloads. The full report is scanned in the media. Reviewing the CGM trends, blood glucose are as follows:  FreeStyle Libre 3+ CGM-  Sensor Download (Sensor download was reviewed and summarized below.) Dates: November 6 to June 22, 2023, 14 days Sensor Average: 111  Glucose Management Indicator: 6% Glucose Variability: 15.6%  % data captured: 96%  Glycemic Trends:  <54: 0% 54-70: 0% 71-180: 100% 181-250:  0% 251-400: 0%   Interpretation: -Almost all blood sugar are acceptable with average blood sugar of 111 except on November 6, November 7, 8 had mild hypoglycemia with blood sugar in 50-60 range in the late afternoon and in the evening.  No hypoglycemia on other days.  No significant hyperglycemia.  Hypoglycemia: Patient has minor hypoglycemic episodes. Patient has hypoglycemia awareness.  Factors modifying glucose control: 1.  Diabetic diet assessment: 3 meals a day.  2.  Staying active or exercising: Walking.  3.  Medication compliance: compliant all of the time.  Interval history  Diabetes regimen as noted above.  CGM data as reviewed above.  He has not been requiring Humalog anymore.  He is tolerating Trulicity well.  No other complaints today.  Recent lab results reviewed normal electrolytes and creatinine.  Urine microalbumin creatinine ratio normal.  Hemoglobin A1c 6.4%.    REVIEW OF SYSTEMS As per history of present illness.   PAST MEDICAL HISTORY: Past Medical History:  Diagnosis Date   Diabetes mellitus 12/30/11   DKA (diabetic ketoacidoses) 12/30/11   new dx DM a1c 13   PVC (premature ventricular contraction)    LBB inferior axis QRS // followed [facility]   Right bundle branch block     PAST SURGICAL HISTORY: Past Surgical History:  Procedure Laterality Date   KNEE ARTHROSCOPY  1999-2000   bilateral  ALLERGIES: Allergies  Allergen Reactions   Metformin And Related     Diarrhea, nausea    FAMILY HISTORY:  Family History  Problem Relation Age of Onset   Diabetes Father    Diabetes Maternal Grandmother    Hypertension Mother    CAD Neg Hx     SOCIAL HISTORY: Social History   Socioeconomic History   Marital status: Married    Spouse name: Not on file   Number of children: 1   Years of education: BS   Highest education level: Not on file  Occupational History    Employer: [employer]    Comment: Counselor at [employer]   Tobacco Use   Smoking status: Never    Passive exposure: Never   Smokeless tobacco: Never  Vaping Use   Vaping status: Never Used  Substance and Sexual Activity   Alcohol use: Yes    Alcohol/week: 0.0 standard drinks of alcohol    Comment: rarely   Drug use: No   Sexual activity: Yes  Other Topics Concern   Not on file  Social History Narrative   Married. Son '08. 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 1 each 0   Continuous Glucose Sensor (FREESTYLE LIBRE 3 PLUS SENSOR) MISC Use to check blood sugar continuously, changing sensor every 15 days. 6 each 3   fexofenadine (ALLEGRA) 180 MG tablet Take 1 tablet (180 mg total) by mouth daily. 90 tablet 1   glucose blood (FREESTYLE LITE) test strip Use as instructed to check blood sugar twice daily. 100 each 3   Insulin Pen Needle (UNIFINE PENTIPS) 31G X 8 MM MISC USE TO INJECT INSULIN 1-3 TIMES INTO SKIN DAILY 100 each 2   Insulin Pen Needle 31G X 6 MM MISC Use as directed with insulin. 100 each 0   Lancets (FREESTYLE) lancets 1 each by Other route 2 (two) times daily. for testing 200 each 12   simvastatin (ZOCOR) 40 MG tablet Take 0.5 tablets (20 mg total) by mouth at bedtime. 90 tablet 1   triamcinolone (NASACORT) 55 MCG/ACT AERO nasal inhaler Place 2 sprays into the nose daily. 16.9 mL 3   Dulaglutide (TRULICITY) 4.5 MG/0.5ML SOAJ Inject 4.5 mg into the skin once a week. 6 mL  2   empagliflozin (JARDIANCE) 25 MG TABS tablet Take 1 tablet (25 mg total) by mouth daily. 90 tablet 3   insulin glargine-yfgn (SEMGLEE, YFGN,) 100 UNIT/ML Pen Inject 28 Units into the skin 2 (two) times daily. 15 mL 4   insulin lispro (HUMALOG KWIKPEN) 100 UNIT/ML KwikPen As per Moderate Sliding Scale with meals 3 times a day.  Blood Glucose        Insulin 60-150                     None 151-200  3 units 201-250                   5 units 251-300                   7 units 301-350                   9 units 351-400                  11 units    >400                       12 units and call provider 15 mL 1   No current facility-administered medications for this visit.    PHYSICAL EXAM: Vitals:   07/06/23 1415  BP: 112/80  Pulse: 91  Resp: 20  SpO2: 99%  Weight: 245 lb 3.2 oz (111.2 kg)  Height: 6' (1.829 m)   Body mass index is 33.26 kg/m.  Wt Readings from Last 3 Encounters:  07/06/23 245 lb 3.2 oz (111.2 kg)  05/18/23 241 lb (109.3 kg)  08/27/22 264 lb (119.7 kg)    General: Well developed, well nourished male in no apparent distress.  HEENT: AT/[place], no external lesions.  Eyes: Conjunctiva clear and no icterus. Neck: Neck supple  Lungs: Respirations not labored Neurologic: Alert, oriented, normal speech Extremities / Skin: Dry. No sores or rashes noted.  Psychiatric: Does not appear depressed or anxious  Diabetic Foot Exam - Simple   No data filed    LABS Reviewed Lab Results  Component Value Date   HGBA1C 6.4 (H) 06/30/2023   HGBA1C 6.6 (H) 09/11/2022   HGBA1C 6.7 Repeated and verified X2. (H) 05/25/2022   Lab Results  Component Value Date   FRUCTOSAMINE 266 11/26/2021   FRUCTOSAMINE 283 08/31/2018   FRUCTOSAMINE 292 (H) 03/23/2018   Lab Results  Component Value Date   CHOL 210 (H) 09/11/2022   HDL 31.90 (L) 09/11/2022   LDLCALC 161 (H) 09/11/2022   LDLDIRECT 82.0 03/03/2022   TRIG 84.0 09/11/2022   CHOLHDL 7 09/11/2022   Lab Results   Component Value Date   MICRALBCREAT 3 06/30/2023   MICRALBCREAT 1.2 03/03/2022   Lab Results  Component Value Date   CREATININE 1.14 06/30/2023   Lab Results  Component Value Date   GFR 83.60 09/11/2022    ASSESSMENT / PLAN  1. Controlled type 2 diabetes mellitus without complication, with long-term current use of insulin (HCC)   2. Mixed hyperlipidemia     Diabetes Mellitus type 2, complicated by no known complications. - Diabetic status / severity: Controlled.  Lab Results  Component Value Date   HGBA1C 6.4 (H) 06/30/2023    - Hemoglobin A1c goal : <7%  - Medications: See below.  I) decrease Semglee to 28 units daily from 30 units, due to low normal / mild hypoglycemia. II) continue Trulicity 4.5 mg weekly. III) continue Jardiance 25 mg daily. IV) change Humalog with sliding scale for hyperglycemia. As per Moderate Sliding Scale with meals 3 times a day. Blood Glucose Insulin 60-150 None 151-200 3 units 201-250 5 units 251-300 7 units 301-350 9 units 351-400 11 units >400 12 units and call provider   - Home glucose testing: Continue CGM and check as needed. - Discussed/ Gave Hypoglycemia treatment plan.  # Consult : not required at this time.   # Annual urine for microalbuminuria/ creatinine ratio, no microalbuminuria currently. Last  Lab Results  Component Value Date   MICRALBCREAT 3 06/30/2023    # Foot check nightly.  # Annual dilated diabetic eye exams.   - Diet: Make healthy diabetic food choices - Life style / activity / exercise: Discussed.  2. Blood pressure  -  BP Readings from Last 1 Encounters:  07/06/23 112/80    - Control is in target.  - No change in current plans.  3. Lipid status / Hyperlipidemia - Last  Lab Results  Component Value Date   LDLCALC 161 (H) 09/11/2022   - Continue simvastatin 20 mg daily.  Patient reports not fully compliant.  Advised for compliance and will check in next follow-up visit.  Diagnoses and all  orders for this visit:  Controlled type 2 diabetes mellitus without complication, with long-term current use of insulin (HCC) -     insulin glargine-yfgn (SEMGLEE, YFGN,) 100 UNIT/ML Pen; Inject 28 Units into the skin 2 (two) times daily. -     insulin lispro (HUMALOG KWIKPEN) 100 UNIT/ML KwikPen; As per Moderate Sliding Scale with meals 3 times a day.  Blood Glucose        Insulin 60-150                     None 151-200                   3 units 201-250                   5 units 251-300                   7 units 301-350                   9 units 351-400                  11 units    >400                       12 units and call provider -     Dulaglutide (TRULICITY) 4.5 MG/0.5ML SOAJ; Inject 4.5 mg into the skin once a week. -     Basic metabolic panel -     Hemoglobin A1c  Mixed hyperlipidemia -     Lipid panel  Other orders -     empagliflozin (JARDIANCE) 25 MG TABS tablet; Take 1 tablet (25 mg total) by mouth daily. -     Continuous Glucose Sensor (FREESTYLE LIBRE 3 PLUS SENSOR) MISC; Use to check blood sugar continuously, changing sensor every 15 days.    DISPOSITION Follow up in clinic in 4 months suggested.   All questions answered and patient verbalized understanding of the plan.  [place] Tullio Chausse, MD [facility] [address] [place], [address] [place], [place] [address] Phone # [phone]  At least part of this note was generated using voice recognition software. Inadvertent word errors may have occurred, which were not recognized during the proofreading process.

## 2023-07-06 NOTE — Patient Instructions (Signed)
Decrease Semeglee to 28 units daily, rest medications same.

## 2023-07-07 ENCOUNTER — Encounter: Payer: Self-pay | Admitting: Endocrinology

## 2023-08-24 DIAGNOSIS — H524 Presbyopia: Secondary | ICD-10-CM | POA: Diagnosis not present

## 2023-08-24 DIAGNOSIS — E119 Type 2 diabetes mellitus without complications: Secondary | ICD-10-CM | POA: Diagnosis not present

## 2023-08-24 LAB — HM DIABETES EYE EXAM

## 2023-08-30 ENCOUNTER — Encounter (HOSPITAL_BASED_OUTPATIENT_CLINIC_OR_DEPARTMENT_OTHER): Payer: 59 | Admitting: Family Medicine

## 2023-10-14 ENCOUNTER — Encounter (HOSPITAL_BASED_OUTPATIENT_CLINIC_OR_DEPARTMENT_OTHER): Payer: Self-pay | Admitting: Family Medicine

## 2023-10-14 ENCOUNTER — Ambulatory Visit (INDEPENDENT_AMBULATORY_CARE_PROVIDER_SITE_OTHER): Payer: 59 | Admitting: Family Medicine

## 2023-10-14 ENCOUNTER — Other Ambulatory Visit (HOSPITAL_COMMUNITY): Payer: Self-pay

## 2023-10-14 VITALS — BP 102/70 | HR 93 | Ht 72.0 in | Wt 242.2 lb

## 2023-10-14 DIAGNOSIS — Z125 Encounter for screening for malignant neoplasm of prostate: Secondary | ICD-10-CM

## 2023-10-14 DIAGNOSIS — N529 Male erectile dysfunction, unspecified: Secondary | ICD-10-CM | POA: Insufficient documentation

## 2023-10-14 DIAGNOSIS — Z0000 Encounter for general adult medical examination without abnormal findings: Secondary | ICD-10-CM | POA: Diagnosis not present

## 2023-10-14 MED ORDER — SILDENAFIL CITRATE 50 MG PO TABS
50.0000 mg | ORAL_TABLET | Freq: Every day | ORAL | 1 refills | Status: DC | PRN
  Filled 2023-10-14: qty 6, 30d supply, fill #0

## 2023-10-14 NOTE — Assessment & Plan Note (Signed)
 Routine HCM labs ordered. HCM reviewed/discussed. Anticipatory guidance regarding healthy weight, lifestyle and choices given. Recommend healthy diet.  Recommend approximately 150 minutes/week of moderate intensity exercise Recommend regular dental and vision exams Always use seatbelt/lap and shoulder restraints Recommend using smoke alarms and checking batteries at least twice a year Recommend using sunscreen when outside Discussed tetanus immunization recommendations, patient reports being UTD The natural history of prostate cancer and ongoing controversy regarding screening and potential treatment outcomes of prostate cancer has been discussed with the patient. The meaning of a false positive PSA and a false negative PSA has been discussed. He indicates understanding of the limitations of this screening test and wishes to proceed with screening PSA testing.

## 2023-10-14 NOTE — Patient Instructions (Signed)
  Medication Instructions:  Your physician recommends that you continue on your current medications as directed. Please refer to the Current Medication list given to you today. --If you need a refill on any your medications before your next appointment, please call your pharmacy first. If no refills are authorized on file call the office.-- Lab Work: Your physician has recommended that you have lab work today: fasting If you have labs (blood work) drawn today and your tests are completely normal, you will receive your results via MyChart message OR a phone call from our staff.  Please ensure you check your voicemail in the event that you authorized detailed messages to be left on a delegated number. If you have any lab test that is abnormal or we need to change your treatment, we will call you to review the results.  Follow-Up: Your next appointment:   Your physician recommends that you schedule a follow-up appointment in: 2 month follow up  with Dr. de [place]  You will receive a text message or e-mail with a link to a survey about your care and experience with [place] today! We would greatly appreciate your feedback!   Thanks for letting us be apart of your health journey!!  Primary Care and Sports Medicine   Dr. Ceasar Mons [place]   We encourage you to activate your patient portal called "MyChart".  Sign up information is provided on this After Visit Summary.  MyChart is used to connect with patients for Virtual Visits (Telemedicine).  Patients are able to view lab/test results, encounter notes, upcoming appointments, etc.  Non-urgent messages can be sent to your provider as well. To learn more about what you can do with MyChart, please visit --  [email or web address].

## 2023-10-14 NOTE — Assessment & Plan Note (Signed)
 Patient reports new issue of erectile dysfunction. We discussed considerations for potential causes. Can proceed with initial treatment with pharmacotherapy, plan for close follow-up to assess progress.

## 2023-10-14 NOTE — Progress Notes (Signed)
 Subjective:    CC: Annual Physical Exam  HPI:  Roy Jennings is a 43 y.o. presenting for annual physical  I reviewed the past medical history, family history, social history, surgical history, and allergies today and no changes were needed.  Please see the problem list section below in epic for further details.  Past Medical History: Past Medical History:  Diagnosis Date   Diabetes mellitus 12/30/11   DKA (diabetic ketoacidoses) 12/30/11   new dx DM a1c 13   PVC (premature ventricular contraction)    LBB inferior axis QRS // followed by [facility]   Right bundle branch block    Past Surgical History: Past Surgical History:  Procedure Laterality Date   KNEE ARTHROSCOPY  1999-2000   bilateral   Social History: Social History   Socioeconomic History   Marital status: Married    Spouse name: Not on file   Number of children: 1   Years of education: BS   Highest education level: Not on file  Occupational History    Employer: [employer]    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 Son '08. 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   Went to [employer], played football one year      Hobbies: formerly working [employer], sports, reading   Social Drivers of Corporate investment banker Strain: Not on [employer] Insecurity: Not on file  Transportation Needs: Not on file  Physical Activity: Not on file  Stress: Not on file  Social Connections: Not on file   Family History: Family History  Problem Relation Age of Onset   Diabetes Father    Diabetes Maternal Grandmother     Hypertension Mother    CAD Neg Hx    Allergies: Allergies  Allergen Reactions   Metformin And Related     Diarrhea, nausea   Medications: See med rec.  Review of Systems: No headache, visual changes, nausea, vomiting, diarrhea, constipation, dizziness, abdominal pain, skin rash, fevers, chills, night sweats, swollen lymph nodes, weight loss, chest pain, body aches, joint swelling, muscle aches, shortness of breath, mood changes, visual or auditory hallucinations.  Objective:    BP 102/70 (BP Location: Left Arm, Patient Position: Sitting, Cuff Size: Large)   Pulse 93   Ht 6' (1.829 m)   Wt 242 lb 3.2 oz (109.9 kg)   SpO2 99%   BMI 32.85 kg/m   General: Well Developed, well nourished, and in no acute distress. Neuro: Alert and oriented x3, extra-ocular muscles intact, sensation grossly intact. Cranial nerves II through XII are intact, motor, sensory, and coordinative functions are all intact. HEENT: Normocephalic, atraumatic, pupils equal round reactive to light, neck supple, no masses, no lymphadenopathy, thyroid nonpalpable. Oropharynx, nasopharynx, external ear canals are unremarkable. Skin: Warm and dry, no rashes noted. Cardiac: Regular rate and rhythm, no murmurs rubs or gallops. Respiratory: Clear to auscultation bilaterally. Not using accessory muscles, speaking in full sentences. Abdominal: Soft, nontender, nondistended, positive bowel sounds, no masses, no organomegaly. Musculoskeletal:  Shoulder, elbow, wrist, hip, knee, ankle stable, and with full range of motion.  Impression and Recommendations:    Wellness examination Assessment & Plan: Routine HCM labs ordered. HCM reviewed/discussed. Anticipatory guidance regarding healthy weight, lifestyle and choices given. Recommend healthy diet.  Recommend approximately 150 minutes/week of moderate intensity exercise Recommend regular dental and vision exams Always use seatbelt/lap and shoulder restraints Recommend using  smoke alarms and checking batteries at least twice a year Recommend using sunscreen when outside Discussed tetanus immunization recommendations, patient reports being UTD The natural history of prostate cancer and ongoing controversy regarding screening and potential treatment outcomes of prostate cancer has been discussed with the patient. The meaning of a false positive PSA and a false negative PSA has been discussed. He indicates understanding of the limitations of this screening test and wishes to proceed with screening PSA testing.  Orders: -     CBC with Differential/Platelet; Future -     Comprehensive metabolic panel; Future -     Hemoglobin A1c; Future -     Lipid panel; Future -     TSH Rfx on Abnormal to Free T4; Future -     PSA Total (Reflex To Free); Future  Prostate cancer screening -     PSA Total (Reflex To Free); Future  Erectile dysfunction, unspecified erectile dysfunction type Assessment & Plan: Patient reports new issue of erectile dysfunction. We discussed considerations for potential causes. Can proceed with initial treatment with pharmacotherapy, plan for close follow-up to assess progress.   Other orders -     Sildenafil Citrate; Take 1 tablet (50 mg total) by mouth daily as needed for erectile dysfunction.  Dispense: 10 tablet; Refill: 1  Return in about 2 months (around 12/14/2023) for med check.   ___________________________________________ Nikko Quast de [place], MD, ABFM, [facility] Primary Care and Sports Medicine [facility]

## 2023-10-18 ENCOUNTER — Other Ambulatory Visit (HOSPITAL_COMMUNITY): Payer: Self-pay

## 2023-10-25 ENCOUNTER — Other Ambulatory Visit: Payer: Self-pay

## 2023-10-27 ENCOUNTER — Other Ambulatory Visit (HOSPITAL_BASED_OUTPATIENT_CLINIC_OR_DEPARTMENT_OTHER): Payer: Self-pay | Admitting: Family Medicine

## 2023-10-27 DIAGNOSIS — Z0000 Encounter for general adult medical examination without abnormal findings: Secondary | ICD-10-CM | POA: Diagnosis not present

## 2023-10-27 DIAGNOSIS — Z125 Encounter for screening for malignant neoplasm of prostate: Secondary | ICD-10-CM | POA: Diagnosis not present

## 2023-10-28 LAB — COMPREHENSIVE METABOLIC PANEL WITH GFR
ALT: 15 IU/L (ref 0–44)
AST: 16 IU/L (ref 0–40)
Albumin: 4.4 g/dL (ref 4.1–5.1)
Alkaline Phosphatase: 89 IU/L (ref 44–121)
BUN/Creatinine Ratio: 11 (ref 9–20)
BUN: 12 mg/dL (ref 6–24)
Bilirubin Total: 0.3 mg/dL (ref 0.0–1.2)
CO2: 23 mmol/L (ref 20–29)
Calcium: 9.7 mg/dL (ref 8.7–10.2)
Chloride: 105 mmol/L (ref 96–106)
Creatinine, Ser: 1.14 mg/dL (ref 0.76–1.27)
Globulin, Total: 2.9 g/dL (ref 1.5–4.5)
Glucose: 115 mg/dL — ABNORMAL HIGH (ref 70–99)
Potassium: 4.2 mmol/L (ref 3.5–5.2)
Sodium: 142 mmol/L (ref 134–144)
Total Protein: 7.3 g/dL (ref 6.0–8.5)
eGFR: 82 mL/min/{1.73_m2} (ref >59)

## 2023-10-28 LAB — CBC WITH DIFFERENTIAL/PLATELET
Basophils Absolute: 0 10*3/uL (ref 0.0–0.2)
Basos: 0 %
EOS (ABSOLUTE): 0.1 10*3/uL (ref 0.0–0.4)
Eos: 2 %
Hematocrit: 41.9 % (ref 37.5–51.0)
Hemoglobin: 13.8 g/dL (ref 13.0–17.7)
Immature Grans (Abs): 0 10*3/uL (ref 0.0–0.1)
Immature Granulocytes: 0 %
Lymphocytes Absolute: 1.7 10*3/uL (ref 0.7–3.1)
Lymphs: 32 %
MCH: 26.8 pg (ref 26.6–33.0)
MCHC: 32.9 g/dL (ref 31.5–35.7)
MCV: 82 fL (ref 79–97)
Monocytes Absolute: 0.4 10*3/uL (ref 0.1–0.9)
Monocytes: 7 %
Neutrophils Absolute: 3.1 10*3/uL (ref 1.4–7.0)
Neutrophils: 59 %
Platelets: 355 10*3/uL (ref 150–450)
RBC: 5.14 x10E6/uL (ref 4.14–5.80)
RDW: 13.2 % (ref 11.6–15.4)
WBC: 5.3 10*3/uL (ref 3.4–10.8)

## 2023-10-28 LAB — LIPID PANEL
Chol/HDL Ratio: 5.9 ratio — ABNORMAL HIGH (ref 0.0–5.0)
Cholesterol, Total: 196 mg/dL (ref 100–199)
HDL: 33 mg/dL — ABNORMAL LOW (ref >39)
LDL Chol Calc (NIH): 150 mg/dL — ABNORMAL HIGH (ref 0–99)
Triglycerides: 71 mg/dL (ref 0–149)
VLDL Cholesterol Cal: 13 mg/dL (ref 5–40)

## 2023-10-28 LAB — PSA TOTAL (REFLEX TO FREE): Prostate Specific Ag, Serum: 0.6 ng/mL (ref 0.0–4.0)

## 2023-10-28 LAB — HEMOGLOBIN A1C
Est. average glucose Bld gHb Est-mCnc: 128 mg/dL
Hgb A1c MFr Bld: 6.1 % — ABNORMAL HIGH (ref 4.8–5.6)

## 2023-10-28 LAB — TSH RFX ON ABNORMAL TO FREE T4: TSH: 0.706 u[IU]/mL (ref 0.450–4.500)

## 2023-11-01 ENCOUNTER — Other Ambulatory Visit: Payer: 59

## 2023-11-01 ENCOUNTER — Encounter: Payer: Self-pay | Admitting: Endocrinology

## 2023-11-01 DIAGNOSIS — E1165 Type 2 diabetes mellitus with hyperglycemia: Secondary | ICD-10-CM | POA: Diagnosis not present

## 2023-11-01 DIAGNOSIS — Z794 Long term (current) use of insulin: Secondary | ICD-10-CM | POA: Diagnosis not present

## 2023-11-01 DIAGNOSIS — E782 Mixed hyperlipidemia: Secondary | ICD-10-CM | POA: Diagnosis not present

## 2023-11-02 LAB — BASIC METABOLIC PANEL WITH GFR
BUN: 11 mg/dL (ref 7–25)
CO2: 27 mmol/L (ref 20–32)
Calcium: 9.4 mg/dL (ref 8.6–10.3)
Chloride: 107 mmol/L (ref 98–110)
Creat: 0.93 mg/dL (ref 0.60–1.29)
Glucose, Bld: 127 mg/dL — ABNORMAL HIGH (ref 65–99)
Potassium: 3.9 mmol/L (ref 3.5–5.3)
Sodium: 141 mmol/L (ref 135–146)
eGFR: 104 mL/min/{1.73_m2} (ref >=60)

## 2023-11-02 LAB — LIPID PANEL
Cholesterol: 208 mg/dL — ABNORMAL HIGH (ref <200)
HDL: 35 mg/dL — ABNORMAL LOW (ref >=40)
LDL Cholesterol (Calc): 157 mg/dL — ABNORMAL HIGH
Non-HDL Cholesterol (Calc): 173 mg/dL — ABNORMAL HIGH (ref <130)
Total CHOL/HDL Ratio: 5.9 (calc) — ABNORMAL HIGH (ref <5.0)
Triglycerides: 63 mg/dL (ref <150)

## 2023-11-02 LAB — HEMOGLOBIN A1C
Hgb A1c MFr Bld: 5.9 %{Hb} — ABNORMAL HIGH (ref <5.7)
Mean Plasma Glucose: 123 mg/dL
eAG (mmol/L): 6.8 mmol/L

## 2023-11-04 ENCOUNTER — Other Ambulatory Visit: Payer: Self-pay

## 2023-11-04 ENCOUNTER — Encounter: Payer: Self-pay | Admitting: Endocrinology

## 2023-11-04 ENCOUNTER — Ambulatory Visit (INDEPENDENT_AMBULATORY_CARE_PROVIDER_SITE_OTHER): Payer: 59 | Admitting: Endocrinology

## 2023-11-04 ENCOUNTER — Other Ambulatory Visit (HOSPITAL_COMMUNITY): Payer: Self-pay

## 2023-11-04 VITALS — BP 108/60 | HR 90 | Resp 20 | Ht 72.0 in | Wt 247.0 lb

## 2023-11-04 DIAGNOSIS — E119 Type 2 diabetes mellitus without complications: Secondary | ICD-10-CM

## 2023-11-04 DIAGNOSIS — Z794 Long term (current) use of insulin: Secondary | ICD-10-CM

## 2023-11-04 MED ORDER — INSULIN GLARGINE-YFGN 100 UNIT/ML ~~LOC~~ SOPN
28.0000 [IU] | PEN_INJECTOR | Freq: Every day | SUBCUTANEOUS | 4 refills | Status: DC
  Filled 2023-11-04: qty 15, 53d supply, fill #0

## 2023-11-04 MED ORDER — FREESTYLE LIBRE 3 PLUS SENSOR MISC
1.0000 | 3 refills | Status: AC
Start: 2023-11-04 — End: ?
  Filled 2023-11-04: qty 6, 90d supply, fill #0
  Filled 2023-11-08: qty 2, 30d supply, fill #0

## 2023-11-04 NOTE — Progress Notes (Signed)
 Outpatient Endocrinology Note [place] Emmet Messer, MD  11/04/23  Patient's Name: Roy Jennings    DOB: [birth year]    MRN: [id number]                                                    REASON OF VISIT: Follow up for type 2 diabetes mellitus  PCP: de [place], Buren Kos, MD  HISTORY OF PRESENT ILLNESS:   Roy Jennings is a 43 y.o. old male with past medical history listed below, is here for follow up for type 2 diabetes mellitus.   Pertinent Diabetes History: Patient was previously seen by Dr. Lucianne Muss and was last seen in February 2024. Patient was diagnosed with type 2 diabetes mellitus in May 2013.  Initial hemoglobin A1c at the time of diagnosis was 13.4%, he was started on insulin therapy at the time of diagnosis.  Chronic Diabetes Complications : Retinopathy: no. Last ophthalmology exam was done on annually, 08/2022, following with ophthalmology regularly.  Nephropathy: no Peripheral neuropathy: no Coronary artery disease: no Stroke: no  Relevant comorbidities and cardiovascular risk factors: Obesity: yes Body mass index is 33.5 kg/m.  Hypertension: no Hyperlipidemia : Yes, on statin   Current / Home Diabetic regimen includes:  Semeglee 30 units daily in the morning.  Humalog not taking lately. Trulicity 4.5mg  weekly. Jardiance 25 mg daily.   Prior diabetic medications: GI issues /diarrhea with metformin.  Lantus, insulin 70/30 in the past.  Glycemic data:    CONTINUOUS GLUCOSE MONITORING SYSTEM (CGMS) INTERPRETATION: At today's visit, we reviewed CGM downloads. The full report is scanned in the media. Reviewing the CGM trends, blood glucose are as follows:  FreeStyle Libre 3+ CGM-  Sensor Download (Sensor download was reviewed and summarized below.) Dates: February 17 to October 03, 2023, 14 days Sensor Average: 112 Glucose Management Indicator: 6%  % data captured: 77%    Interpretation: Almost all acceptable blood sugar with average blood sugar of 112.  No concerning  hyperglycemia.  Blood sugar overnight and in the afternoon are acceptable.  He has rare blood sugar in 60s around midnight, noon and in the evening.  No concerning hypoglycemia.  Hypoglycemia: Patient has minor hypoglycemic episodes. Patient has hypoglycemia awareness.  Factors modifying glucose control: 1.  Diabetic diet assessment: 3 meals a day.  2.  Staying active or exercising: Walking.  3.  Medication compliance: compliant all of the time.  Interval history  CGM data as reviewed above.  He is currently not using freestyle libre CGM, he ran out of it earlier due to falling off.  Discussed about using extra tape for the better adhesion.  He denies numbness and ting of the feet.  No vision problem.  Recent laboratory results reviewed.  Hemoglobin A1c 5.9%.  He still has elevated LDL, he reports not compliant with taking simvastatin.  No other complaints today.  REVIEW OF SYSTEMS As per history of present illness.   PAST MEDICAL HISTORY: Past Medical History:  Diagnosis Date   Diabetes mellitus 12/30/11   DKA (diabetic ketoacidoses) 12/30/11   new dx DM a1c 13   PVC (premature ventricular contraction)    LBB inferior axis QRS // followed by [facility]   Right bundle branch block     PAST SURGICAL HISTORY: Past Surgical History:  Procedure Laterality Date   KNEE ARTHROSCOPY  1999-2000   bilateral    ALLERGIES: Allergies  Allergen Reactions   Metformin And Related     Diarrhea, nausea    FAMILY HISTORY:  Family History  Problem Relation Age of Onset   Diabetes Father    Diabetes Maternal Grandmother    Hypertension Mother    CAD Neg Hx     SOCIAL HISTORY: Social History   Socioeconomic History   Marital status: Married    Spouse name: Not on file   Number of children: 1   Years of education: BS   Highest education level: Not on file  Occupational History    Employer: [employer]    Comment: Counselor at [employer]  Tobacco Use   Smoking  status: Never    Passive exposure: Never   Smokeless tobacco: Never  Vaping Use   Vaping status: Never Used  Substance and Sexual Activity   Alcohol use: Yes    Alcohol/week: 0.0 standard drinks of alcohol    Comment: rarely   Drug use: No   Sexual activity: Yes  Other Topics Concern   Not on file  Social History Narrative   Married. Son '08. 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 6 mL 2   empagliflozin (JARDIANCE) 25 MG TABS tablet Take 1 tablet (25 mg total) by mouth daily. 90 tablet 3   fexofenadine (ALLEGRA) 180 MG tablet Take 1 tablet (180 mg total) by mouth daily. 90 tablet 1   glucose blood (FREESTYLE LITE) test strip Use as instructed to check blood sugar twice daily. 100 each 3   insulin lispro (HUMALOG KWIKPEN) 100 UNIT/ML KwikPen As per Moderate Sliding Scale with meals 3 times a day.  Blood Glucose        Insulin 60-150                     None 151-200                   3 units 201-250                   5 units 251-300                   7 units 301-350                   9 units 351-400                  11 units    >400                       12 units and call provider 15 mL 1   Insulin Pen Needle (UNIFINE PENTIPS) 31G X 8 MM MISC USE TO INJECT INSULIN 1-3 TIMES INTO SKIN DAILY 100 each 2   Insulin Pen Needle 31G X 6 MM MISC Use as directed with  insulin. 100 each 0   Lancets (FREESTYLE) lancets 1 each by Other route 2 (two) times daily. for testing  200 each 12   sildenafil (VIAGRA) 50 MG tablet Take 1 tablet (50 mg total) by mouth daily as needed for erectile dysfunction. 10 tablet 1   simvastatin (ZOCOR) 40 MG tablet Take 0.5 tablets (20 mg total) by mouth at bedtime. 90 tablet 1   triamcinolone (NASACORT) 55 MCG/ACT AERO nasal inhaler Place 2 sprays into the nose daily. 16.9 mL 3   Blood Glucose Monitoring Suppl (FREESTYLE FREEDOM LITE) w/Device KIT Use as instructed to check blood sugar twice daily. (Patient not taking: Reported on 11/04/2023) 1 each 0   Continuous Glucose Sensor (FREESTYLE LIBRE 3 PLUS SENSOR) MISC Use to check blood sugar continuously, changing sensor every 15 days. 6 each 3   insulin glargine-yfgn (SEMGLEE, YFGN,) 100 UNIT/ML Pen Inject 28 Units into the skin daily. 15 mL 4   No current facility-administered medications for this visit.    PHYSICAL EXAM: Vitals:   11/04/23 1403  BP: 108/60  Pulse: 90  Resp: 20  SpO2: 97%  Weight: 247 lb (112 kg)  Height: 6' (1.829 m)    Body mass index is 33.5 kg/m.  Wt Readings from Last 3 Encounters:  11/04/23 247 lb (112 kg)  10/14/23 242 lb 3.2 oz (109.9 kg)  07/06/23 245 lb 3.2 oz (111.2 kg)    General: Well developed, well nourished male in no apparent distress.  HEENT: AT/[place], no external lesions.  Eyes: Conjunctiva clear and no icterus. Neck: Neck supple  Lungs: Respirations not labored Neurologic: Alert, oriented, normal speech Extremities / Skin: Dry. No sores or rashes noted.  Psychiatric: Does not appear depressed or anxious  Diabetic Foot Exam - Simple   Simple Foot Form Diabetic Foot exam was performed with the following findings: Yes 11/04/2023  2:23 PM  Visual Inspection No deformities, no ulcerations, no other skin breakdown bilaterally: Yes Sensation Testing Intact to touch and monofilament testing bilaterally: Yes Pulse Check Posterior Tibialis and Dorsalis pulse intact bilaterally: Yes Comments    LABS Reviewed Lab Results  Component Value  Date   HGBA1C 5.9 (H) 11/01/2023   HGBA1C 6.1 (H) 10/27/2023   HGBA1C 6.4 (H) 06/30/2023   Lab Results  Component Value Date   FRUCTOSAMINE 266 11/26/2021   FRUCTOSAMINE 283 08/31/2018   FRUCTOSAMINE 292 (H) 03/23/2018   Lab Results  Component Value Date   CHOL 208 (H) 11/01/2023   HDL 35 (L) 11/01/2023   LDLCALC 157 (H) 11/01/2023   LDLDIRECT 82.0 03/03/2022   TRIG 63 11/01/2023   CHOLHDL 5.9 (H) 11/01/2023   Lab Results  Component Value Date   MICRALBCREAT 3 06/30/2023   MICRALBCREAT 1.2 03/03/2022   Lab Results  Component Value Date   CREATININE 0.93 11/01/2023   Lab Results  Component Value Date   GFR 83.60 09/11/2022    ASSESSMENT / PLAN  1. Controlled type 2 diabetes mellitus without complication, with long-term current use of insulin (HCC)     Diabetes Mellitus type 2, complicated by no known complications. - Diabetic status / severity: Controlled.  Lab Results  Component Value Date   HGBA1C 5.9 (H) 11/01/2023    - Hemoglobin A1c goal : <6.5%  - Medications: See below.  I) decrease Semglee to 28 units daily from 30 units, due to low normal / mild rare hypoglycemia.  He is still taking Semglee 30 units daily. II) continue Trulicity 4.5 mg weekly. III) continue Jardiance  25 mg daily. IV) change Humalog with sliding scale for hyperglycemia. As per Moderate Sliding Scale with meals 3 times a day. Blood Glucose Insulin 60-150 None 151-200 3 units 201-250 5 units 251-300 7 units 301-350 9 units 351-400 11 units >400 12 units and call provider   - Home glucose testing: Continue CGM and check as needed.  Advised to use extra tape for better adhesive for sensor.  - Discussed/ Gave Hypoglycemia treatment plan.  # Consult : not required at this time.   # Annual urine for microalbuminuria/ creatinine ratio, no microalbuminuria currently. Last  Lab Results  Component Value Date   MICRALBCREAT 3 06/30/2023    # Foot check nightly.  # Annual dilated  diabetic eye exams.   - Diet: Make healthy diabetic food choices - Life style / activity / exercise: Discussed.  2. Blood pressure  -  BP Readings from Last 1 Encounters:  11/04/23 108/60    - Control is in target.  - No change in current plans.  3. Lipid status / Hyperlipidemia - Last  Lab Results  Component Value Date   LDLCALC 157 (H) 11/01/2023   - Continue simvastatin 20 mg daily.  Patient reports not fully compliant.  Advised for compliance.  LDL is still high.  Diagnoses and all orders for this visit:  Controlled type 2 diabetes mellitus without complication, with long-term current use of insulin (HCC) -     Continuous Glucose Sensor (FREESTYLE LIBRE 3 PLUS SENSOR) MISC; Use to check blood sugar continuously, changing sensor every 15 days. -     insulin glargine-yfgn (SEMGLEE, YFGN,) 100 UNIT/ML Pen; Inject 28 Units into the skin daily.     DISPOSITION Follow up in clinic in 4 months suggested.   All questions answered and patient verbalized understanding of the plan.  [place] Steph Cheadle, MD [facility] [address] [place], [address] [place], [place] [address] Phone # [phone]  At least part of this note was generated using voice recognition software. Inadvertent word errors may have occurred, which were not recognized during the proofreading process.

## 2023-11-08 ENCOUNTER — Other Ambulatory Visit (HOSPITAL_COMMUNITY): Payer: Self-pay

## 2023-11-09 ENCOUNTER — Other Ambulatory Visit (HOSPITAL_COMMUNITY): Payer: Self-pay

## 2023-11-29 ENCOUNTER — Encounter (HOSPITAL_BASED_OUTPATIENT_CLINIC_OR_DEPARTMENT_OTHER): Payer: Self-pay | Admitting: Family Medicine

## 2023-12-14 ENCOUNTER — Other Ambulatory Visit (HOSPITAL_COMMUNITY): Payer: Self-pay

## 2023-12-14 ENCOUNTER — Encounter (HOSPITAL_BASED_OUTPATIENT_CLINIC_OR_DEPARTMENT_OTHER): Payer: Self-pay | Admitting: Family Medicine

## 2023-12-14 ENCOUNTER — Ambulatory Visit (HOSPITAL_BASED_OUTPATIENT_CLINIC_OR_DEPARTMENT_OTHER): Admitting: Family Medicine

## 2023-12-14 VITALS — BP 119/76 | HR 100 | Ht 72.0 in | Wt 244.5 lb

## 2023-12-14 DIAGNOSIS — N529 Male erectile dysfunction, unspecified: Secondary | ICD-10-CM

## 2023-12-14 DIAGNOSIS — Z0000 Encounter for general adult medical examination without abnormal findings: Secondary | ICD-10-CM

## 2023-12-14 DIAGNOSIS — E7800 Pure hypercholesterolemia, unspecified: Secondary | ICD-10-CM | POA: Diagnosis not present

## 2023-12-14 DIAGNOSIS — Z125 Encounter for screening for malignant neoplasm of prostate: Secondary | ICD-10-CM

## 2023-12-14 MED ORDER — SILDENAFIL CITRATE 50 MG PO TABS
50.0000 mg | ORAL_TABLET | Freq: Every day | ORAL | 1 refills | Status: AC | PRN
  Filled 2023-12-14: qty 10, 50d supply, fill #0

## 2023-12-14 MED ORDER — SIMVASTATIN 40 MG PO TABS
20.0000 mg | ORAL_TABLET | Freq: Every day | ORAL | 1 refills | Status: AC
  Filled 2023-12-14: qty 45, 90d supply, fill #0

## 2023-12-14 NOTE — Assessment & Plan Note (Signed)
 Patient Roy Jennings that he has utilized medication and reports that he did not have any side effects with medication such as lightheadedness, dizziness, headaches.  He did have improvement in symptoms with use of medication.  Requesting refill medication today. We again reviewed potential risks and side effects with medication.  Can send refill to pharmacy on file today.  We will monitor progress with this at future visits.

## 2023-12-14 NOTE — Progress Notes (Unsigned)
    Procedures performed today:    None.  Independent interpretation of notes and tests performed by another provider:   None.  Brief History, Exam, Impression, and Recommendations:    BP 119/76 (BP Location: Left Arm, Patient Position: Sitting, Cuff Size: Large)   Pulse 100   Ht 6' (1.829 m)   Wt 244 lb 8 oz (110.9 kg)   SpO2 97%   BMI 33.16 kg/m   Elevated LDL cholesterol level Assessment & Plan: Patient is requesting refill of simvastatin  today.  Denies any issues with medication.  On most recent labs, did have elevation in LDL.  We reviewed this in office today.  Discussed potential consideration for adjusting statin therapy.  He does have upcoming appointment with cardiology and plans to discuss with them further.  Refill of statin provided at this time.   Erectile dysfunction, unspecified erectile dysfunction type Assessment & Plan: Patient Roy Jennings that he has utilized medication and reports that he did not have any side effects with medication such as lightheadedness, dizziness, headaches.  He did have improvement in symptoms with use of medication.  Requesting refill medication today. We again reviewed potential risks and side effects with medication.  Can send refill to pharmacy on file today.  We will monitor progress with this at future visits.   Wellness examination -     CBC with Differential/Platelet; Future -     Comprehensive metabolic panel with GFR; Future -     Hemoglobin A1c; Future -     Lipid panel; Future -     PSA Total (Reflex To Free); Future -     TSH Rfx on Abnormal to Free T4; Future  Prostate cancer screening -     PSA Total (Reflex To Free); Future  Other orders -     Sildenafil  Citrate; Take 1 tablet (50 mg total) by mouth daily as needed for erectile dysfunction.  Dispense: 10 tablet; Refill: 1 -     Simvastatin ; Take 0.5 tablets (20 mg total) by mouth at bedtime.  Dispense: 90 tablet; Refill: 1  Return in about 10 months (around 10/13/2024)  for CPE with fasting labs 1 week prior.   ___________________________________________ Lenorris Karger de [place], MD, ABFM, CAQSM Primary Care and Sports Medicine [facility]

## 2023-12-14 NOTE — Patient Instructions (Signed)
  Medication Instructions:  Your physician recommends that you continue on your current medications as directed. Please refer to the Current Medication list given to you today. --If you need a refill on any your medications before your next appointment, please call your pharmacy first. If no refills are authorized on file call the office.-- Lab Work: Your physician has recommended that you have lab work today: 1 week before next visit  If you have labs (blood work) drawn today and your tests are completely normal, you will receive your results via MyChart message OR a phone call from our staff.  Please ensure you check your voicemail in the event that you authorized detailed messages to be left on a delegated number. If you have any lab test that is abnormal or we need to change your treatment, we will call you to review the results.  Follow-Up: Your next appointment:   Your physician recommends that you schedule a follow-up appointment in: 8 months physical  with Dr. de Peru  You will receive a text message or e-mail with a link to a survey about your care and experience with us  today! We would greatly appreciate your feedback!   Thanks for letting us  be apart of your health journey!!  Primary Care and Sports Medicine   Dr. Court Distance Peru   We encourage you to activate your patient portal called "MyChart".  Sign up information is provided on this After Visit Summary.  MyChart is used to connect with patients for Virtual Visits (Telemedicine).  Patients are able to view lab/test results, encounter notes, upcoming appointments, etc.  Non-urgent messages can be sent to your provider as well. To learn more about what you can do with MyChart, please visit --  [email or web address].

## 2023-12-14 NOTE — Assessment & Plan Note (Signed)
 Patient is requesting refill of simvastatin  today.  Denies any issues with medication.  On most recent labs, did have elevation in LDL.  We reviewed this in office today.  Discussed potential consideration for adjusting statin therapy.  He does have upcoming appointment with cardiology and plans to discuss with them further.  Refill of statin provided at this time.

## 2023-12-16 ENCOUNTER — Encounter (HOSPITAL_BASED_OUTPATIENT_CLINIC_OR_DEPARTMENT_OTHER): Payer: Self-pay | Admitting: *Deleted

## 2023-12-18 ENCOUNTER — Emergency Department (HOSPITAL_BASED_OUTPATIENT_CLINIC_OR_DEPARTMENT_OTHER)
Admission: EM | Admit: 2023-12-18 | Discharge: 2023-12-18 | Disposition: A | Discharge status: Home / Self Care | Attending: Emergency Medicine | Admitting: Emergency Medicine

## 2023-12-18 ENCOUNTER — Emergency Department (HOSPITAL_BASED_OUTPATIENT_CLINIC_OR_DEPARTMENT_OTHER)

## 2023-12-18 ENCOUNTER — Encounter (HOSPITAL_BASED_OUTPATIENT_CLINIC_OR_DEPARTMENT_OTHER): Payer: Self-pay

## 2023-12-18 DIAGNOSIS — Y92410 Unspecified street and highway as the place of occurrence of the external cause: Secondary | ICD-10-CM | POA: Diagnosis not present

## 2023-12-18 DIAGNOSIS — S199XXA Unspecified injury of neck, initial encounter: Secondary | ICD-10-CM | POA: Diagnosis not present

## 2023-12-18 DIAGNOSIS — Z794 Long term (current) use of insulin: Secondary | ICD-10-CM | POA: Insufficient documentation

## 2023-12-18 DIAGNOSIS — E119 Type 2 diabetes mellitus without complications: Secondary | ICD-10-CM | POA: Diagnosis not present

## 2023-12-18 DIAGNOSIS — M50323 Other cervical disc degeneration at C6-C7 level: Secondary | ICD-10-CM | POA: Diagnosis not present

## 2023-12-18 DIAGNOSIS — M542 Cervicalgia: Secondary | ICD-10-CM | POA: Diagnosis not present

## 2023-12-18 DIAGNOSIS — M4802 Spinal stenosis, cervical region: Secondary | ICD-10-CM | POA: Diagnosis not present

## 2023-12-18 MED ORDER — IBUPROFEN 400 MG PO TABS
600.0000 mg | ORAL_TABLET | Freq: Once | ORAL | Status: AC
  Administered 2023-12-18: 600 mg via ORAL
  Filled 2023-12-18: qty 1

## 2023-12-18 NOTE — Discharge Instructions (Signed)
 As discussed, CT scan of your neck did not show evidence of fracture or dislocation today.  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  Will recommend continued use of Tylenol/ibuprofen in the outpatient setting for treatment of your symptoms.  Expect to feel worse over the next day or 2 before you begin to feel better.  Please do not hesitate to return if the worrisome signs and symptoms we discussed become apparent.
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
## 2023-12-29 ENCOUNTER — Encounter (HOSPITAL_BASED_OUTPATIENT_CLINIC_OR_DEPARTMENT_OTHER): Payer: Self-pay | Admitting: *Deleted

## 2024-01-17 ENCOUNTER — Other Ambulatory Visit: Payer: Self-pay

## 2024-01-17 DIAGNOSIS — M542 Cervicalgia: Secondary | ICD-10-CM | POA: Diagnosis not present

## 2024-01-17 DIAGNOSIS — S134XXA Sprain of ligaments of cervical spine, initial encounter: Secondary | ICD-10-CM | POA: Diagnosis not present

## 2024-01-17 DIAGNOSIS — M545 Low back pain, unspecified: Secondary | ICD-10-CM | POA: Diagnosis not present

## 2024-01-17 MED ORDER — METHYLPREDNISOLONE 4 MG PO TBPK
ORAL_TABLET | ORAL | 0 refills | Status: DC
  Filled 2024-01-17 – 2024-01-19 (×2): qty 21, 6d supply, fill #0

## 2024-01-19 ENCOUNTER — Other Ambulatory Visit (HOSPITAL_COMMUNITY): Payer: Self-pay

## 2024-01-20 ENCOUNTER — Other Ambulatory Visit: Payer: Self-pay

## 2024-02-01 DIAGNOSIS — M542 Cervicalgia: Secondary | ICD-10-CM | POA: Diagnosis not present

## 2024-02-10 ENCOUNTER — Other Ambulatory Visit: Payer: Self-pay

## 2024-02-18 ENCOUNTER — Other Ambulatory Visit (HOSPITAL_COMMUNITY): Payer: Self-pay

## 2024-02-21 DIAGNOSIS — M542 Cervicalgia: Secondary | ICD-10-CM | POA: Diagnosis not present

## 2024-02-25 ENCOUNTER — Telehealth: Payer: Self-pay | Admitting: Family Medicine

## 2024-02-25 NOTE — Telephone Encounter (Signed)
 Copied from CRM [id number]. Topic: Medical Record Request - Records Request >> Feb 11, 2024  1:48 PM Gustabo D wrote: Rudell calling from [employer]  Call back [phone] >> Feb 25, 2024  1:25 PM Winona SAUNDERS wrote: Rudell from [employer] calling to follow up on medical record request sent to office please follow up by calling [phone] ask for Rudell

## 2024-02-28 ENCOUNTER — Telehealth (HOSPITAL_BASED_OUTPATIENT_CLINIC_OR_DEPARTMENT_OTHER): Payer: Self-pay | Admitting: *Deleted

## 2024-02-28 NOTE — Telephone Encounter (Signed)
 Copied from CRM [id number]. Topic: Medical Record Request - Attorney/Litigation >> Feb 28, 2024  9:42 AM Winona SAUNDERS wrote: Rudell from [employer] calling to follow up on medical record request sent to office please follow up by calling [phone] ask for Rudell

## 2024-02-28 NOTE — Telephone Encounter (Signed)
 Called judith back about medical records and gave her the numbers to call

## 2024-02-28 NOTE — Telephone Encounter (Signed)
 Copied from CRM [id number]. Topic: Medical Record Request - Records Request >> Feb 11, 2024  1:48 PM Gustabo D wrote: Rudell calling from [employer] records  Call back [phone]

## 2024-03-06 ENCOUNTER — Ambulatory Visit: Admitting: Endocrinology

## 2024-03-06 ENCOUNTER — Ambulatory Visit (INDEPENDENT_AMBULATORY_CARE_PROVIDER_SITE_OTHER): Admitting: Endocrinology

## 2024-03-06 ENCOUNTER — Other Ambulatory Visit (HOSPITAL_COMMUNITY): Payer: Self-pay

## 2024-03-06 ENCOUNTER — Ambulatory Visit: Payer: Self-pay | Admitting: Endocrinology

## 2024-03-06 ENCOUNTER — Encounter: Payer: Self-pay | Admitting: Endocrinology

## 2024-03-06 VITALS — BP 110/80 | HR 78 | Resp 20 | Ht 72.0 in | Wt 247.8 lb

## 2024-03-06 DIAGNOSIS — E1169 Type 2 diabetes mellitus with other specified complication: Secondary | ICD-10-CM

## 2024-03-06 DIAGNOSIS — E669 Obesity, unspecified: Secondary | ICD-10-CM

## 2024-03-06 DIAGNOSIS — Z794 Long term (current) use of insulin: Secondary | ICD-10-CM | POA: Diagnosis not present

## 2024-03-06 DIAGNOSIS — E119 Type 2 diabetes mellitus without complications: Secondary | ICD-10-CM | POA: Diagnosis not present

## 2024-03-06 LAB — POCT GLYCOSYLATED HEMOGLOBIN (HGB A1C): Hemoglobin A1C: 6 % — AB (ref 4.0–5.6)

## 2024-03-06 MED ORDER — INSULIN GLARGINE-YFGN 100 UNIT/ML ~~LOC~~ SOPN
25.0000 [IU] | PEN_INJECTOR | Freq: Every day | SUBCUTANEOUS | 4 refills | Status: DC
  Filled 2024-03-06: qty 15, 60d supply, fill #0

## 2024-03-06 NOTE — Progress Notes (Signed)
 Outpatient Endocrinology Note Roy Newton Frutiger, MD  03/06/24  Patient's Name: Roy Jennings    DOB: 1980/08/21    MRN: [id number]                                                    REASON OF VISIT: Follow up for type 2 diabetes mellitus  PCP: de [place], Quintin PARAS, MD  HISTORY OF PRESENT ILLNESS:   Roy Jennings is a 43 y.o. old male with past medical history listed below, is here for follow up for type 2 diabetes mellitus.   Pertinent Diabetes History: Patient was previously seen by Dr. Von and was last seen in February 2024. Patient was diagnosed with type 2 diabetes mellitus in May 2013.  Initial hemoglobin A1c at the time of diagnosis was 13.4%, he was started on insulin  therapy at the time of diagnosis.  Chronic Diabetes Complications : Retinopathy: no. Last ophthalmology exam was done on annually, 08/2023, following with ophthalmology regularly.  Nephropathy: no Peripheral neuropathy: no Coronary artery disease: no Stroke: no  Relevant comorbidities and cardiovascular risk factors: Obesity: yes Body mass index is 33.61 kg/m.  Hypertension: no Hyperlipidemia : Yes, on statin   Current / Home Diabetic regimen includes:  Semeglee 25 units daily in the morning.  Humalog  not taking lately. Trulicity  4.5 mg weekly. Jardiance  25 mg daily.   Prior diabetic medications: GI issues /diarrhea with metformin .  Lantus , insulin  70/30 in the past.  Glycemic data:    CONTINUOUS GLUCOSE MONITORING SYSTEM (CGMS) INTERPRETATION: At today's visit, we reviewed CGM downloads. The full report is scanned in the media. Reviewing the CGM trends, blood glucose are as follows:  FreeStyle Libre 3+ CGM-  Sensor Download (Sensor download was reviewed and summarized below.) Dates: July 22 to August 4 , 2025, 14 days Sensor Average: 107 Glucose Management Indicator: 5.9%  % data captured: 44%    Interpretation: Almost all acceptable blood sugar.  He started CGM on July 29.  No significant  hyperglycemia.  No hypoglycemia.  Hypoglycemia: Patient has no hypoglycemic episodes. Patient has hypoglycemia awareness.  Factors modifying glucose control: 1.  Diabetic diet assessment: 3 meals a day.  2.  Staying active or exercising: Walking.  3.  Medication compliance: compliant all of the time.  Interval history  CGM data as reviewed above, mostly acceptable blood sugar.  Hemoglobin A1c 6%.  Diabetes treatment is reviewed and noted above.  He has been tolerating Trulicity  well.  He reports he is not fully compliant with cholesterol medication/simvastatin .  No other complaints today.  He occasionally complains of ankle pain.  No numbness and tingling to feet or extremities.  REVIEW OF SYSTEMS As per history of present illness.   PAST MEDICAL HISTORY: Past Medical History:  Diagnosis Date   Diabetes mellitus 12/30/11   DKA (diabetic ketoacidoses) 12/30/11   new dx DM a1c 13   PVC (premature ventricular contraction)    LBB inferior axis QRS // followed by [facility]   Right bundle branch block     PAST SURGICAL HISTORY: Past Surgical History:  Procedure Laterality Date   KNEE ARTHROSCOPY  1999-2000   bilateral    ALLERGIES: Allergies  Allergen Reactions   Metformin  And Related     Diarrhea, nausea    FAMILY HISTORY:  Family History  Problem Relation Age of Onset  Diabetes Father    Diabetes Maternal Grandmother    Hypertension Mother    CAD Neg Hx     SOCIAL HISTORY: Social History   Socioeconomic History   Marital status: Married    Spouse name: Not on file   Number of children: 1   Years of education: BS   Highest education level: Not on file  Occupational History    Employer: [employer]    Comment: Counselor at [employer]  Tobacco Use   Smoking status: Never    Passive exposure: Never   Smokeless tobacco: Never  Vaping Use   Vaping status: Never Used  Substance and Sexual Activity   Alcohol use: Yes    Alcohol/week: 0.0  standard drinks of alcohol    Comment: rarely   Drug use: No   Sexual activity: Yes  Other Topics Concern   Not on file  Social History Narrative   Married. Son '08. 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 1 each 0   Continuous Glucose Sensor (FREESTYLE LIBRE 3 PLUS SENSOR) MISC Use to check blood sugar continuously, changing sensor every 15 days. 6 each 3   Dulaglutide  (TRULICITY ) 4.5 MG/0.5ML SOAJ Inject 4.5 mg into the skin once a week. 6 mL 2   empagliflozin  (JARDIANCE ) 25 MG TABS tablet Take 1 tablet (25 mg total) by mouth daily. 90 tablet 3   fexofenadine  (ALLEGRA ) 180 MG tablet Take 1 tablet (180 mg total) by mouth daily. 90 tablet 1   glucose blood (FREESTYLE LITE) test strip Use as instructed to check blood sugar twice daily. 100 each 3   insulin  glargine-yfgn (SEMGLEE , YFGN,) 100 UNIT/ML Pen Inject 28 Units into the skin daily. 15 mL 4   insulin  lispro (HUMALOG  KWIKPEN) 100 UNIT/ML KwikPen As per Moderate Sliding Scale with meals 3 times a day.  Blood Glucose        Insulin  60-150                     None 151-200                   3 units 201-250                   5 units 251-300                   7 units 301-350                   9 units 351-400                  11 units    >400                       12 units and call provider 15 mL 1   Insulin  Pen Needle (UNIFINE PENTIPS) 31G X 8  MM MISC USE TO INJECT INSULIN  1-3 TIMES  INTO SKIN DAILY 100 each 2   Insulin  Pen Needle 31G X 6 MM MISC Use as directed with insulin . 100 each 0   Lancets (FREESTYLE) lancets 1 each by Other route 2 (two) times daily. for testing 200 each 12   sildenafil  (VIAGRA ) 50 MG tablet Take 1 tablet (50 mg total) by mouth daily as needed for erectile dysfunction. 10 tablet 1   simvastatin  (ZOCOR ) 40 MG tablet Take 0.5 tablets (20 mg total) by mouth at bedtime. 90 tablet 1   triamcinolone  (NASACORT ) 55 MCG/ACT AERO nasal inhaler Place 2 sprays into the nose daily. 16.9 mL 3   No current facility-administered medications for this visit.    PHYSICAL EXAM: Vitals:   03/06/24 1507  BP: 110/80  Pulse: 78  Resp: 20  SpO2: 98%  Weight: 247 lb 12.8 oz (112.4 kg)  Height: 6' (1.829 m)     Body mass index is 33.61 kg/m.  Wt Readings from Last 3 Encounters:  03/06/24 247 lb 12.8 oz (112.4 kg)  12/18/23 245 lb (111.1 kg)  12/14/23 244 lb 8 oz (110.9 kg)    General: Well developed, well nourished male in no apparent distress.  HEENT: AT/[place], no external lesions.  Eyes: Conjunctiva clear and no icterus. Neck: Neck supple  Lungs: Respirations not labored Neurologic: Alert, oriented, normal speech Extremities / Skin: Dry.  Psychiatric: Does not appear depressed or anxious  Diabetic Foot Exam - Simple   No data filed    LABS Reviewed Lab Results  Component Value Date   HGBA1C 6.0 (A) 03/06/2024   HGBA1C 5.9 (H) 11/01/2023   HGBA1C 6.1 (H) 10/27/2023   Lab Results  Component Value Date   FRUCTOSAMINE 266 11/26/2021   FRUCTOSAMINE 283 08/31/2018   FRUCTOSAMINE 292 (H) 03/23/2018   Lab Results  Component Value Date   CHOL 208 (H) 11/01/2023   HDL 35 (L) 11/01/2023   LDLCALC 157 (H) 11/01/2023   LDLDIRECT 82.0 03/03/2022   TRIG 63 11/01/2023   CHOLHDL 5.9 (H) 11/01/2023   Lab Results  Component Value Date   MICRALBCREAT 3 06/30/2023   MICRALBCREAT 6 07/16/2016   Lab Results  Component Value Date   CREATININE 0.93  11/01/2023   Lab Results  Component Value Date   GFR 83.60 09/11/2022    ASSESSMENT / PLAN  1. Controlled type 2 diabetes mellitus without complication, with long-term current use of insulin  (HCC)   2. Type 2 diabetes mellitus with obesity (HCC)      Diabetes Mellitus type 2, complicated by no known complications. - Diabetic status / severity: Controlled.  Lab Results  Component Value Date   HGBA1C 6.0 (A) 03/06/2024    - Hemoglobin A1c goal : <6.5%  - Medications: See below.  I) continue Semglee  25 units daily.   II) continue Trulicity  4.5 mg weekly. III) continue Jardiance  25 mg daily. IV) change Humalog  with sliding scale for hyperglycemia.  He has not been requiring lately.  As per Moderate Sliding Scale with meals 3 times a day. Blood Glucose Insulin  60-150 None 151-200 3 units 201-250 5 units 251-300 7 units 301-350 9 units 351-400 11 units >400 12 units and call provider   - Home glucose testing: Continue CGM and check as needed.  Advised to use extra tape for better adhesive for sensor.  - Discussed/ Gave Hypoglycemia treatment plan.  # Consult : not required at this time.   # Annual urine for microalbuminuria/ creatinine  ratio, no microalbuminuria currently. Last  Lab Results  Component Value Date   MICRALBCREAT 3 06/30/2023    # Foot check nightly.  # Annual dilated diabetic eye exams.   - Diet: Make healthy diabetic food choices - Life style / activity / exercise: Discussed.  2. Blood pressure  -  BP Readings from Last 1 Encounters:  03/06/24 110/80    - Control is in target.  - No change in current plans.  3. Lipid status / Hyperlipidemia - Last  Lab Results  Component Value Date   LDLCALC 157 (H) 11/01/2023   - Continue simvastatin  20 mg daily.  Will recheck lab in the follow-up visit.  Reiterate about compliance with taking simvastatin  every day.  Diagnoses and all orders for this visit:  Controlled type 2 diabetes mellitus without  complication, with long-term current use of insulin  (HCC)  Type 2 diabetes mellitus with obesity (HCC) -     POCT glycosylated hemoglobin (Hb A1C)      DISPOSITION Follow up in clinic in 4 months suggested.  Labs on the same day of the visit.   All questions answered and patient verbalized understanding of the plan.  Roy Sariyah Corcino, MD [facility] [address] [place], [address] [place], [place] [address] Phone # [phone]  At least part of this note was generated using voice recognition software. Inadvertent word errors may have occurred, which were not recognized during the proofreading process.

## 2024-03-10 DIAGNOSIS — M542 Cervicalgia: Secondary | ICD-10-CM | POA: Diagnosis not present

## 2024-04-06 DIAGNOSIS — M542 Cervicalgia: Secondary | ICD-10-CM | POA: Diagnosis not present

## 2024-04-13 DIAGNOSIS — M542 Cervicalgia: Secondary | ICD-10-CM | POA: Diagnosis not present

## 2024-07-31 ENCOUNTER — Other Ambulatory Visit (HOSPITAL_BASED_OUTPATIENT_CLINIC_OR_DEPARTMENT_OTHER): Payer: Self-pay

## 2024-07-31 ENCOUNTER — Other Ambulatory Visit: Payer: Self-pay | Admitting: Endocrinology

## 2024-07-31 DIAGNOSIS — E119 Type 2 diabetes mellitus without complications: Secondary | ICD-10-CM

## 2024-07-31 MED ORDER — TRULICITY 4.5 MG/0.5ML ~~LOC~~ SOAJ
4.5000 mg | SUBCUTANEOUS | 2 refills | Status: AC
  Filled 2024-07-31: qty 2, 28d supply, fill #0

## 2024-08-07 ENCOUNTER — Ambulatory Visit: Payer: Self-pay | Admitting: Endocrinology

## 2024-08-07 ENCOUNTER — Encounter: Payer: Self-pay | Admitting: Endocrinology

## 2024-08-07 ENCOUNTER — Other Ambulatory Visit

## 2024-08-07 ENCOUNTER — Other Ambulatory Visit (HOSPITAL_BASED_OUTPATIENT_CLINIC_OR_DEPARTMENT_OTHER): Payer: Self-pay

## 2024-08-07 ENCOUNTER — Ambulatory Visit: Admitting: Endocrinology

## 2024-08-07 VITALS — BP 128/84 | HR 87 | Resp 16 | Ht 72.0 in | Wt 256.6 lb

## 2024-08-07 DIAGNOSIS — E1165 Type 2 diabetes mellitus with hyperglycemia: Secondary | ICD-10-CM

## 2024-08-07 DIAGNOSIS — Z794 Long term (current) use of insulin: Secondary | ICD-10-CM

## 2024-08-07 LAB — POCT GLYCOSYLATED HEMOGLOBIN (HGB A1C): Hemoglobin A1C: 8.5 % — AB (ref 4.0–5.6)

## 2024-08-07 MED ORDER — EMPAGLIFLOZIN 25 MG PO TABS
25.0000 mg | ORAL_TABLET | Freq: Every day | ORAL | 3 refills | Status: AC
  Filled 2024-08-07: qty 90, 90d supply, fill #0

## 2024-08-07 NOTE — Progress Notes (Signed)
 "  Outpatient Endocrinology Note Davinder Haff, MD  08/07/2024  Patient's Name: Roy Jennings    DOB: 1980-09-03    MRN: [id number]                                                    REASON OF VISIT: Follow up for type 2 diabetes mellitus  PCP: [place], Quintin PARAS, MD  HISTORY OF PRESENT ILLNESS:   Roy Jennings is a 44 y.o. old male with past medical history listed below, is here for follow up for type 2 diabetes mellitus.   Pertinent Diabetes History: Patient was previously seen by Dr. Von and was last seen in February 2024. Patient was diagnosed with type 2 diabetes mellitus in May 2013.  Initial hemoglobin A1c at the time of diagnosis was 13.4%, he was started on insulin  therapy at the time of diagnosis.  Chronic Diabetes Complications : Retinopathy: no. Last ophthalmology exam was done on annually, 08/2023, following with ophthalmology regularly.  Nephropathy: no Peripheral neuropathy: no Coronary artery disease: no Stroke: no  Relevant comorbidities and cardiovascular risk factors: Obesity: yes Body mass index is 34.8 kg/m.  Hypertension: no Hyperlipidemia : Yes, on statin   Current / Home Diabetic regimen includes:  Semeglee 25 units daily in the morning.  Humalog  not taking lately. Trulicity  4.5 mg weekly. Jardiance  25 mg daily.   Prior diabetic medications: GI issues /diarrhea with metformin .  Lantus , insulin  70/30 in the past.  Glycemic data:    CONTINUOUS GLUCOSE MONITORING SYSTEM (CGMS) INTERPRETATION: At today's visit, we reviewed CGM downloads. The full report is scanned in the media. Reviewing the CGM trends, blood glucose are as follows:  FreeStyle Libre 3+ CGM-  Sensor Download (Sensor download was reviewed and summarized below.) Dates: December 23 2 August 07, 2024, 14 days Sensor Average: 158 Glucose Management Indicator: 7.1%  % data captured: 96%    Interpretation: Mostly acceptable blood sugar with occasional blood sugar up to 250  postprandially.  Mostly acceptable blood sugar in the afternoon and overnight.  No hypoglycemia.  Hypoglycemia: Patient has no hypoglycemic episodes. Patient has hypoglycemia awareness.  Factors modifying glucose control: 1.  Diabetic diet assessment: 3 meals a day.  2.  Staying active or exercising: Walking.  3.  Medication compliance: compliant all of the time.  Interval history  CGM data as reviewed above.  Mostly acceptable blood sugar with GMI of 7.1%.  Hemoglobin A1c today 8.5%, worsening diabetes control.  Patient reports he has not been on Trulicity  for last few weeks.  He reports compliance with all other diabetic medications.  He recently refilled Trulicity  however has not restarted.  Denies numbness and ting of the feet.  No vision problem.  No other complaints today.  REVIEW OF SYSTEMS As per history of present illness.   PAST MEDICAL HISTORY: Past Medical History:  Diagnosis Date   Diabetes mellitus 12/30/11   DKA (diabetic ketoacidoses) 12/30/11   new dx DM a1c 13   PVC (premature ventricular contraction)    LBB inferior axis QRS // followed by [facility]   Right bundle branch block     PAST SURGICAL HISTORY: Past Surgical History:  Procedure Laterality Date   KNEE ARTHROSCOPY  1999-2000   bilateral    ALLERGIES: Allergies  Allergen Reactions   Metformin  And Related  Diarrhea, nausea    FAMILY HISTORY:  Family History  Problem Relation Age of Onset   Diabetes Father    Diabetes Maternal Grandmother    Hypertension Mother    CAD Neg Hx     SOCIAL HISTORY: Social History   Socioeconomic History   Marital status: Married    Spouse name: Not on file   Number of children: 1   Years of education: BS   Highest education level: Not on file  Occupational History    Employer: [employer]    Comment: Counselor at [employer]  Tobacco Use   Smoking status: Never    Passive exposure: Never   Smokeless tobacco: Never  Vaping Use    Vaping status: Never Used  Substance and Sexual Activity   Alcohol use: Yes    Alcohol/week: 0.0 standard drinks of alcohol    Comment: rarely   Drug use: No   Sexual activity: Yes  Other Topics Concern   Not on file  Social History Narrative   Married. Son '08. 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   Went to [employer], played football one year      Hobbies: formerly working [employer], sports, reading   Social Drivers of Health   Tobacco Use: Low Risk (08/07/2024)   Patient History    Smoking Tobacco Use: Never    Smokeless Tobacco Use: Never    Passive Exposure: Never  Financial Resource Strain: Not on file  Food Insecurity: Not on file  Transportation Needs: Not on file  Physical Activity: Not on file  Stress: Not on file  Social Connections: Not on file  Depression (PHQ2-9): Low Risk (12/14/2023)   Depression (PHQ2-9)    PHQ-2 Score: 0  Alcohol Screen: Not on file  Housing: Not on file  Utilities: Not on file  Health Literacy: Not on file    MEDICATIONS:  Current Outpatient Medications  Medication Sig Dispense Refill   Blood Glucose Monitoring Suppl (FREESTYLE FREEDOM LITE) w/Device KIT Use as instructed to check blood sugar twice daily. 1 each 0   Continuous Glucose Sensor (FREESTYLE LIBRE 3 PLUS SENSOR) MISC Use to check blood sugar continuously, changing sensor every 15 days. 6 each 3   Dulaglutide  (TRULICITY ) 4.5 MG/0.5ML SOAJ Inject 4.5 mg into the skin once a week. 6 mL 2   empagliflozin  (JARDIANCE ) 25 MG TABS tablet Take 1 tablet (25 mg total) by mouth daily. 90 tablet 3   fexofenadine  (ALLEGRA ) 180 MG tablet Take 1 tablet (180 mg total) by mouth daily. 90 tablet 1   glucose blood (FREESTYLE LITE) test strip Use as instructed to check blood sugar twice daily. 100 each 3   insulin  glargine-yfgn (SEMGLEE , YFGN,) 100 UNIT/ML Pen Inject 25 Units into the skin daily. 15 mL 4   insulin  lispro (HUMALOG  KWIKPEN) 100 UNIT/ML KwikPen As per  Moderate Sliding Scale with meals 3 times a day.  Blood Glucose        Insulin  60-150                     None 151-200                   3 units 201-250                   5 units 251-300  7 units 301-350                   9 units 351-400                  11 units    >400                       12 units and call provider 15 mL 1   Insulin  Pen Needle (UNIFINE PENTIPS) 31G X 8 MM MISC USE TO INJECT INSULIN  1-3 TIMES INTO SKIN DAILY 100 each 2   Insulin  Pen Needle 31G X 6 MM MISC Use as directed with insulin . 100 each 0   Lancets (FREESTYLE) lancets 1 each by Other route 2 (two) times daily. for testing 200 each 12   sildenafil  (VIAGRA ) 50 MG tablet Take 1 tablet (50 mg total) by mouth daily as needed for erectile dysfunction. 10 tablet 1   simvastatin  (ZOCOR ) 40 MG tablet Take 0.5 tablets (20 mg total) by mouth at bedtime. 90 tablet 1   triamcinolone  (NASACORT ) 55 MCG/ACT AERO nasal inhaler Place 2 sprays into the nose daily. 16.9 mL 3   No current facility-administered medications for this visit.    PHYSICAL EXAM: Vitals:   08/07/24 1605  BP: 128/84  Pulse: 87  Resp: 16  SpO2: 97%  Weight: 256 lb 9.6 oz (116.4 kg)  Height: 6' (1.829 m)      Body mass index is 34.8 kg/m.  Wt Readings from Last 3 Encounters:  08/07/24 256 lb 9.6 oz (116.4 kg)  03/06/24 247 lb 12.8 oz (112.4 kg)  12/18/23 245 lb (111.1 kg)    General: Well developed, well nourished male in no apparent distress.  HEENT: AT/, no external lesions.  Eyes: Conjunctiva clear and no icterus. Neck: Neck supple  Lungs: Respirations not labored Neurologic: Alert, oriented, normal speech Extremities / Skin: Dry.  Psychiatric: Does not appear depressed or anxious  Diabetic Foot Exam - Simple   No data filed    LABS Reviewed Lab Results  Component Value Date   HGBA1C 8.5 (A) 08/07/2024   HGBA1C 6.0 (A) 03/06/2024   HGBA1C 5.9 (H) 11/01/2023   Lab Results  Component Value Date    FRUCTOSAMINE 266 11/26/2021   FRUCTOSAMINE 283 08/31/2018   FRUCTOSAMINE 292 (H) 03/23/2018   Lab Results  Component Value Date   CHOL 208 (H) 11/01/2023   HDL 35 (L) 11/01/2023   LDLCALC 157 (H) 11/01/2023   LDLDIRECT 82.0 03/03/2022   TRIG 63 11/01/2023   CHOLHDL 5.9 (H) 11/01/2023   Lab Results  Component Value Date   MICRALBCREAT 3 06/30/2023   MICRALBCREAT 6 07/16/2016   Lab Results  Component Value Date   CREATININE 0.93 11/01/2023   Lab Results  Component Value Date   GFR 83.60 09/11/2022    ASSESSMENT / PLAN  1. Uncontrolled type 2 diabetes mellitus with hyperglycemia, with long-term current use of insulin  (HCC)     Diabetes Mellitus type 2, complicated by no known complications. - Diabetic status / severity: Uncontrolled.  Worsening.  Lab Results  Component Value Date   HGBA1C 8.5 (A) 08/07/2024    - Hemoglobin A1c goal : <6.5%  GMI on CGM 7.1%.  He was not taking Trulicity  for few weeks.  Discussed about compliance with diet.  - Medications: See below.  No change.  I) continue Semglee  25 units daily.   II) continue Trulicity  4.5 mg weekly.  Restart Trulicity . III) continue  Jardiance  25 mg daily. IV) advised to take Humalog  based on sliding scale for hyperglycemia.    As per Moderate Sliding Scale with meals 3 times a day. Blood Glucose Insulin  60-150 None 151-200 3 units 201-250 5 units 251-300 7 units 301-350 9 units 351-400 11 units >400 12 units and call provider   - Home glucose testing: Continue CGM and check as needed.  Advised to use extra tape for better adhesive for sensor.  - Discussed/ Gave Hypoglycemia treatment plan.  # Consult : not required at this time.   # Annual urine for microalbuminuria/ creatinine ratio, no microalbuminuria currently.  Will check today along with BMP. Last  Lab Results  Component Value Date   MICRALBCREAT 3 06/30/2023    # Foot check nightly.  # Annual dilated diabetic eye exams.   - Diet: Make  healthy diabetic food choices - Life style / activity / exercise: Discussed.  2. Blood pressure  -  BP Readings from Last 1 Encounters:  08/07/24 128/84    - Control is in target.  - No change in current plans.  3. Lipid status / Hyperlipidemia - Last  Lab Results  Component Value Date   LDLCALC 157 (H) 11/01/2023   - Continue simvastatin  20 mg daily. Reiterate about compliance with taking simvastatin  every day.  Diagnoses and all orders for this visit:  Uncontrolled type 2 diabetes mellitus with hyperglycemia, with long-term current use of insulin  (HCC) -     POCT glycosylated hemoglobin (Hb A1C)    DISPOSITION Follow up in clinic in 4 months suggested.  Labs today as ordered.  All questions answered and patient verbalized understanding of the plan.  Ambur Province, MD [facility] [address] [place], [address] [place], [place] [address] Phone # [phone]  At least part of this note was generated using voice recognition software. Inadvertent word errors may have occurred, which were not recognized during the proofreading process. "

## 2024-08-08 ENCOUNTER — Other Ambulatory Visit (HOSPITAL_COMMUNITY): Payer: Self-pay

## 2024-08-08 DIAGNOSIS — E119 Type 2 diabetes mellitus without complications: Secondary | ICD-10-CM

## 2024-08-08 LAB — BASIC METABOLIC PANEL WITH GFR
BUN: 8 mg/dL (ref 7–25)
CO2: 25 mmol/L (ref 20–32)
Calcium: 9.7 mg/dL (ref 8.6–10.3)
Chloride: 104 mmol/L (ref 98–110)
Creat: 0.94 mg/dL (ref 0.60–1.29)
Glucose, Bld: 98 mg/dL (ref 65–99)
Potassium: 3.9 mmol/L (ref 3.5–5.3)
Sodium: 140 mmol/L (ref 135–146)
eGFR: 103 mL/min/1.73m2

## 2024-08-08 LAB — MICROALBUMIN / CREATININE URINE RATIO
Creatinine, Urine: 195 mg/dL (ref 20–320)
Microalb Creat Ratio: 3 mg/g{creat}
Microalb, Ur: 0.5 mg/dL

## 2024-08-08 MED ORDER — INSULIN GLARGINE-YFGN 100 UNIT/ML ~~LOC~~ SOPN
25.0000 [IU] | PEN_INJECTOR | Freq: Every day | SUBCUTANEOUS | 4 refills | Status: AC
  Filled 2024-08-08: qty 6, 24d supply, fill #0
  Filled 2024-08-09: qty 15, 60d supply, fill #0

## 2024-08-09 ENCOUNTER — Telehealth (HOSPITAL_COMMUNITY): Payer: Self-pay

## 2024-08-09 ENCOUNTER — Encounter (HOSPITAL_COMMUNITY): Payer: Self-pay

## 2024-08-09 ENCOUNTER — Other Ambulatory Visit (HOSPITAL_COMMUNITY): Payer: Self-pay

## 2024-08-09 NOTE — Telephone Encounter (Signed)
 Insurance requires generic, copay is $60 for a 30 day supply. No PA needed at this time.

## 2024-08-18 ENCOUNTER — Other Ambulatory Visit (HOSPITAL_COMMUNITY): Payer: Self-pay

## 2024-08-19 ENCOUNTER — Other Ambulatory Visit (HOSPITAL_BASED_OUTPATIENT_CLINIC_OR_DEPARTMENT_OTHER): Payer: Self-pay

## 2024-10-16 ENCOUNTER — Encounter (HOSPITAL_BASED_OUTPATIENT_CLINIC_OR_DEPARTMENT_OTHER): Admitting: Family Medicine

## 2024-12-04 ENCOUNTER — Ambulatory Visit: Admitting: Endocrinology
